# Patient Record
Sex: Female | Born: 1955 | Race: Black or African American | Hispanic: No | Marital: Married | Smoking: Never smoker
Health system: Southern US, Community
[De-identification: ages and names within clinical notes are randomized; demographics above are authoritative.]

## PROBLEM LIST (undated history)

## (undated) DIAGNOSIS — E039 Hypothyroidism, unspecified: Secondary | ICD-10-CM

## (undated) DIAGNOSIS — I639 Cerebral infarction, unspecified: Secondary | ICD-10-CM

## (undated) DIAGNOSIS — C801 Malignant (primary) neoplasm, unspecified: Secondary | ICD-10-CM

## (undated) DIAGNOSIS — E119 Type 2 diabetes mellitus without complications: Secondary | ICD-10-CM

## (undated) DIAGNOSIS — G43909 Migraine, unspecified, not intractable, without status migrainosus: Secondary | ICD-10-CM

## (undated) DIAGNOSIS — I10 Essential (primary) hypertension: Secondary | ICD-10-CM

## (undated) DIAGNOSIS — I472 Ventricular tachycardia, unspecified: Secondary | ICD-10-CM

## (undated) DIAGNOSIS — E079 Disorder of thyroid, unspecified: Secondary | ICD-10-CM

## (undated) DIAGNOSIS — E7800 Pure hypercholesterolemia, unspecified: Secondary | ICD-10-CM

## (undated) HISTORY — PX: BREAST SURGERY: SHX581

## (undated) HISTORY — PX: ABDOMINAL HYSTERECTOMY: SHX81

## (undated) HISTORY — PX: THYROIDECTOMY: SHX17

## (undated) HISTORY — DX: Pure hypercholesterolemia, unspecified: E78.00

## (undated) HISTORY — DX: Migraine, unspecified, not intractable, without status migrainosus: G43.909

## (undated) HISTORY — DX: Cerebral infarction, unspecified: I63.9

## (undated) HISTORY — DX: Malignant (primary) neoplasm, unspecified: C80.1

---

## 2009-01-31 ENCOUNTER — Emergency Department (HOSPITAL_BASED_OUTPATIENT_CLINIC_OR_DEPARTMENT_OTHER): Admission: EM | Admit: 2009-01-31 | Discharge: 2009-02-01 | Payer: Self-pay | Admitting: Psychiatry

## 2009-12-02 IMAGING — CR DG HIP COMPLETE 2+V*R*
3 series · 3 of 3 positions shown · non-contrast
Comparison: None.

CLINICAL DATA: Pt fell x 4 days ago. Pain Rt hip entirely radiates
to Rt buttocks. No old injury known.

RIGHT HIP - COMPLETE 2+ VIEW

[t pelvis a.p.]
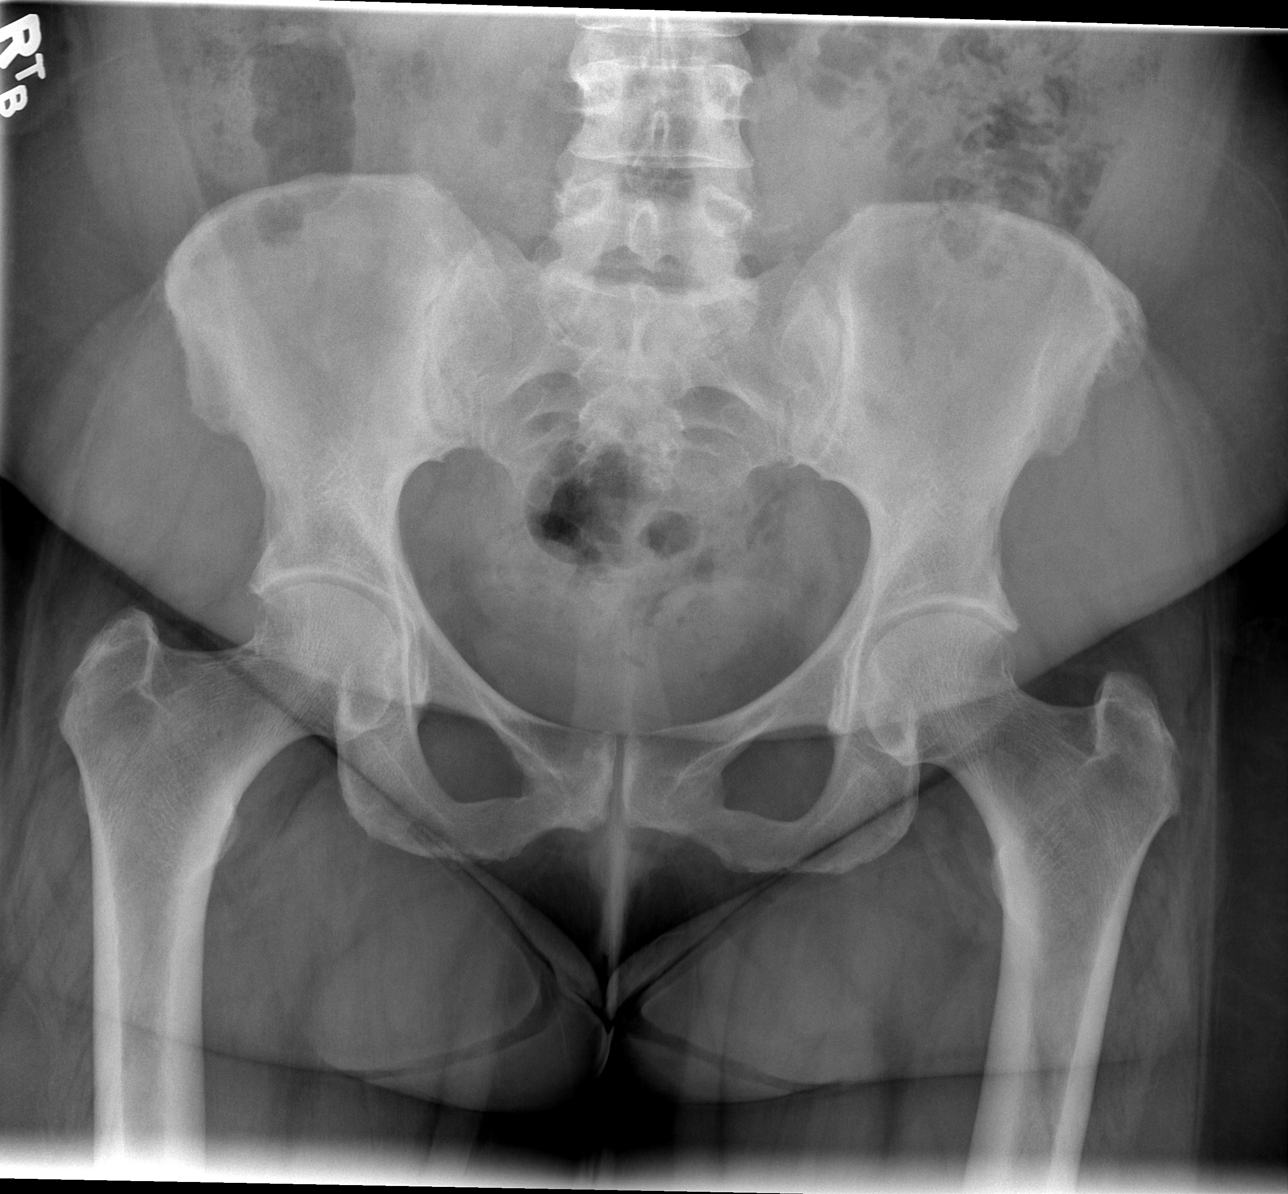

[t hip ap right]
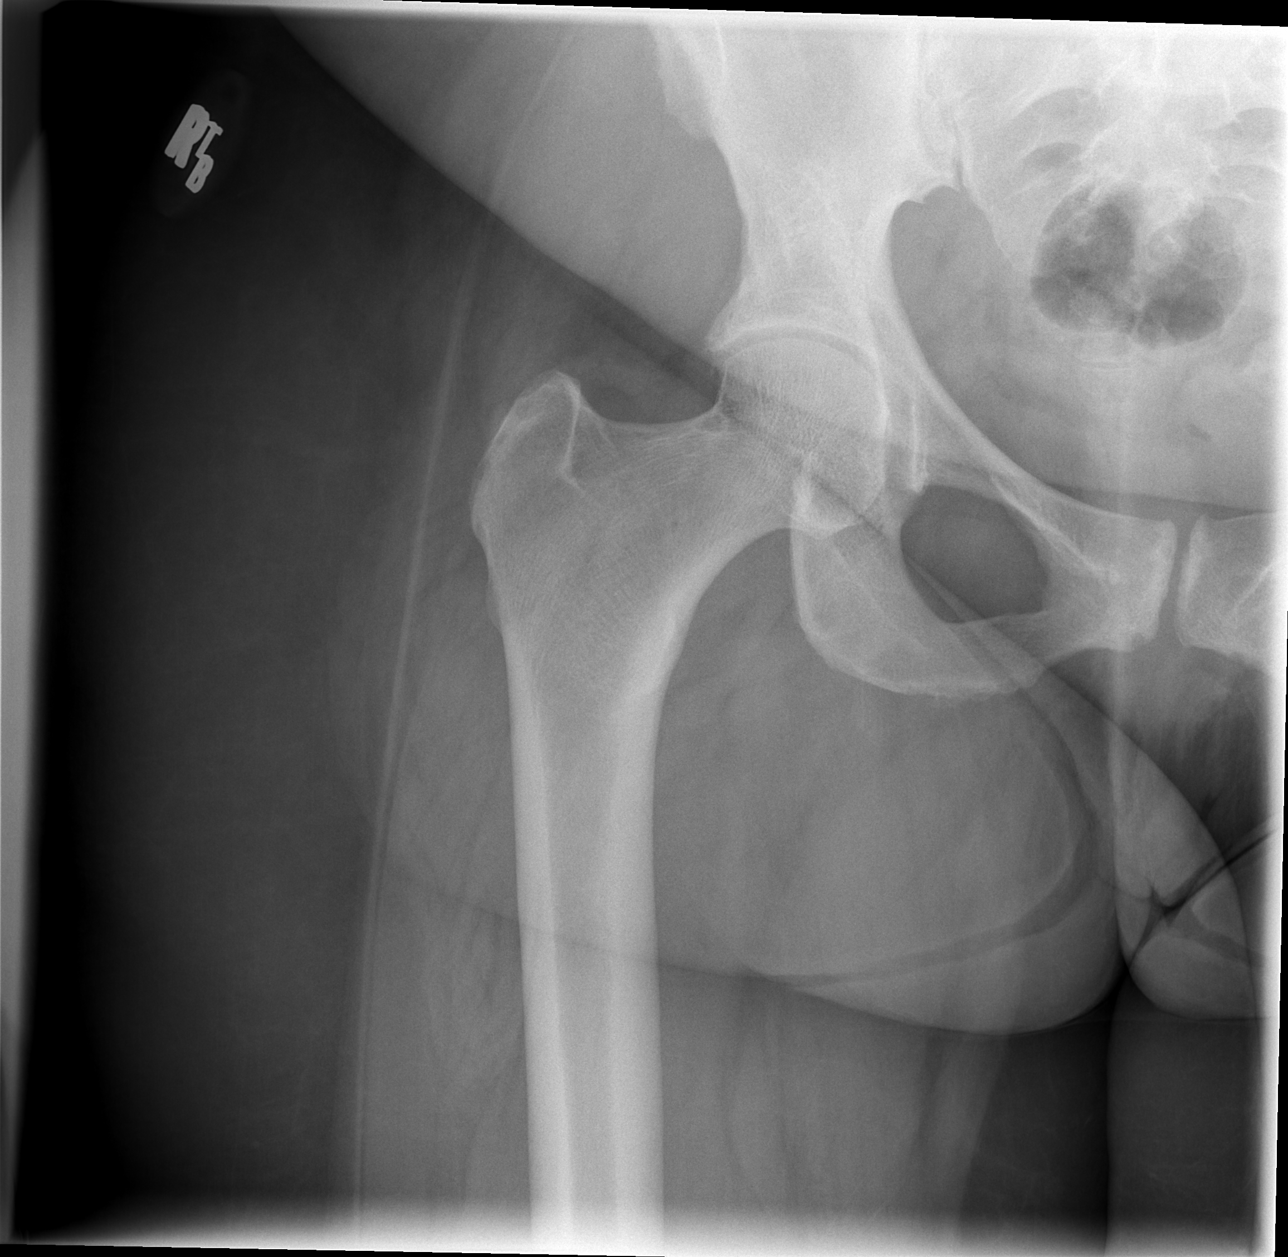

[t hip frog leg right]
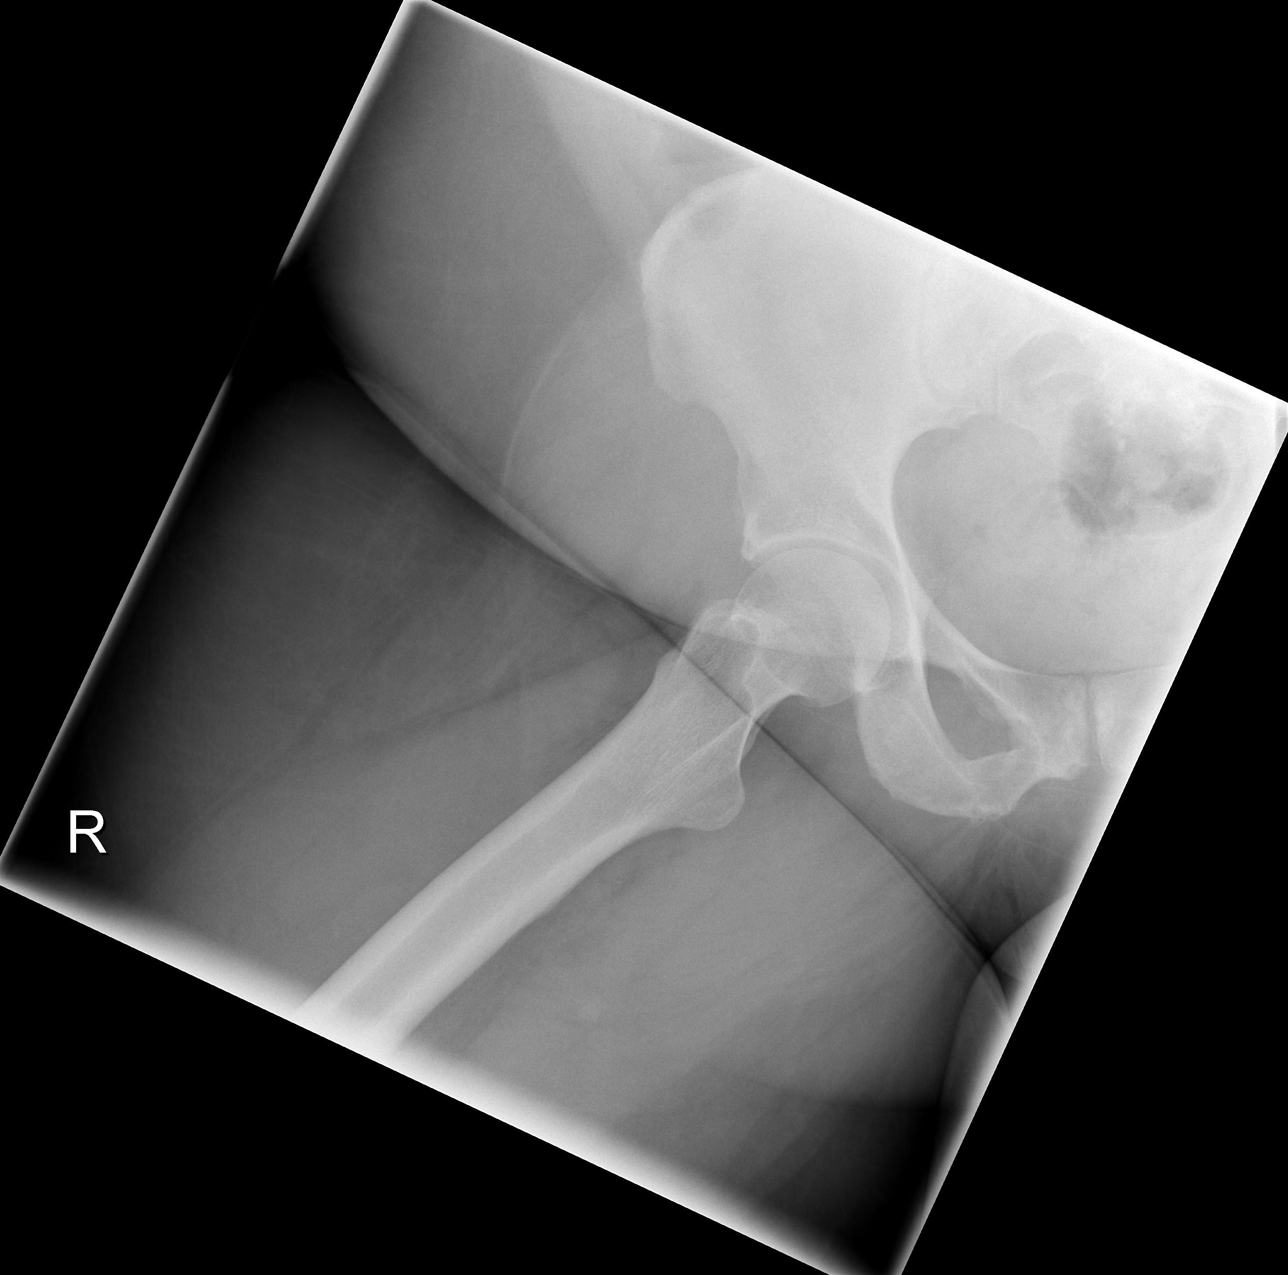

[3 of 3 positions shown; findings below may reference images not displayed]

FINDINGS: No fracture, dislocation, or acute bony findings
observed.
IMPRESSION: No significant abnormality identified.

## 2010-04-01 LAB — GLUCOSE, CAPILLARY: Glucose-Capillary: 131 mg/dL — ABNORMAL HIGH (ref 70–99)

## 2011-10-21 ENCOUNTER — Emergency Department (HOSPITAL_BASED_OUTPATIENT_CLINIC_OR_DEPARTMENT_OTHER): Payer: BC Managed Care – PPO

## 2011-10-21 ENCOUNTER — Emergency Department (HOSPITAL_BASED_OUTPATIENT_CLINIC_OR_DEPARTMENT_OTHER)
Admission: EM | Admit: 2011-10-21 | Discharge: 2011-10-21 | Disposition: A | Discharge status: Home / Self Care | Payer: BC Managed Care – PPO | Attending: Emergency Medicine | Admitting: Emergency Medicine

## 2011-10-21 ENCOUNTER — Encounter (HOSPITAL_BASED_OUTPATIENT_CLINIC_OR_DEPARTMENT_OTHER): Payer: Self-pay

## 2011-10-21 DIAGNOSIS — I10 Essential (primary) hypertension: Secondary | ICD-10-CM | POA: Insufficient documentation

## 2011-10-21 DIAGNOSIS — Y9210 Unspecified residential institution as the place of occurrence of the external cause: Secondary | ICD-10-CM | POA: Insufficient documentation

## 2011-10-21 DIAGNOSIS — W010XXA Fall on same level from slipping, tripping and stumbling without subsequent striking against object, initial encounter: Secondary | ICD-10-CM | POA: Insufficient documentation

## 2011-10-21 DIAGNOSIS — S79929A Unspecified injury of unspecified thigh, initial encounter: Secondary | ICD-10-CM | POA: Insufficient documentation

## 2011-10-21 DIAGNOSIS — E039 Hypothyroidism, unspecified: Secondary | ICD-10-CM | POA: Insufficient documentation

## 2011-10-21 DIAGNOSIS — S79919A Unspecified injury of unspecified hip, initial encounter: Secondary | ICD-10-CM | POA: Insufficient documentation

## 2011-10-21 DIAGNOSIS — M25559 Pain in unspecified hip: Secondary | ICD-10-CM

## 2011-10-21 DIAGNOSIS — E119 Type 2 diabetes mellitus without complications: Secondary | ICD-10-CM | POA: Insufficient documentation

## 2011-10-21 HISTORY — DX: Type 2 diabetes mellitus without complications: E11.9

## 2011-10-21 HISTORY — DX: Essential (primary) hypertension: I10

## 2011-10-21 HISTORY — DX: Disorder of thyroid, unspecified: E07.9

## 2011-10-21 MED ORDER — OXYCODONE-ACETAMINOPHEN 5-325 MG PO TABS: 1.0000 | ORAL_TABLET | Freq: Four times a day (QID) | ORAL | Status: DC | PRN

## 2011-10-21 MED ORDER — IBUPROFEN 600 MG PO TABS: 600.0000 mg | ORAL_TABLET | Freq: Four times a day (QID) | ORAL | Status: DC | PRN

## 2011-10-21 MED ORDER — HYDROCODONE-ACETAMINOPHEN 7.5-500 MG/15ML PO SOLN
10.0000 mL | Freq: Once | ORAL | Status: DC
  Filled 2011-10-21: qty 15

## 2011-10-21 NOTE — ED Provider Notes (Signed)
History     CSN: [id number]  Arrival date & time 10/21/11  1740   First MD Initiated Contact with Patient 10/21/11 1757      Chief Complaint  Patient presents with  . Fall  . Hip Pain    (Consider location/radiation/quality/duration/timing/severity/associated sxs/prior treatment) HPI Pt presents with pain in right hip.  She states she twisted her right hip and almost fell due to tripping over her dog on the stairwell 3 days ago.  States she did not fall down.  No pain at the time.  Pain has developed and has been worsening over the past several days.  Has been able to bear weight without difficulty.  No swelling.  Pain worse with certain positions.  Denies neck or back pain.  There are no other associated systemic symptoms, there are no other alleviating or modifying factors.   Past Medical History  Diagnosis Date  . Diabetes mellitus without complication   . Hypertension   . Thyroid disease     Past Surgical History  Procedure Date  . Thyroidectomy   . Abdominal hysterectomy     No family history on file.  History  Substance Use Topics  . Smoking status: Never Smoker   . Smokeless tobacco: Not on file  . Alcohol Use: No    OB History    Grav Para Term Preterm Abortions TAB SAB Ect Mult Living                  Review of Systems ROS reviewed and all otherwise negative except for mentioned in HPI  Allergies  Tape  Home Medications     BP 134/70  Pulse 76  Temp 98.4 F (36.9 C) (Oral)  Resp 20  Ht 5\' 2"  (1.575 m)  Wt 230 lb (104.327 kg)  BMI 42.07 kg/m2  SpO2 97% Vitals reviewed Physical Exam Physical Examination: General appearance - alert, well appearing, and in no distress Mental status - alert, oriented to person, place, and time Chest - clear to auscultation, no wheezes, rales or rhonchi, symmetric air entry Heart - normal rate, regular rhythm, normal S1, S2, no murmurs, rubs, clicks or gallops Musculoskeletal - ttp overlying right lateral  hip, some pain with internal/external rotation of right hip, no ttp over knee, no pain with ROM Extremities - peripheral pulses normal, no pedal edema, no clubbing or cyanosis Skin - normal coloration and turgor, no rashes  ED Course  Procedures (including critical care time)  Labs Reviewed - No data to display Dg Hip Complete Right  10/21/2011  *RADIOLOGY REPORT*  Clinical Data: Pt fell x 4 days ago. Pain Rt hip entirely radiates to Rt buttocks. No old injury known.  RIGHT HIP - COMPLETE 2+ VIEW  Comparison: None.  Findings: No fracture, dislocation, or acute bony findings observed.  IMPRESSION:  No significant abnormality identified.   Original Report Authenticated By: Dellia Cloud, M.D.      1. Hip pain       MDM  Pt presenting with c/o right hip pain after nearly falling several days ago.  Xray reassuring.  ttp over lateral right hip and pain with internal/external rotation.  Discharged with strict return precautions.  Pt agreeable with plan.        Ethelda Chick, MD 10/21/11 2322

## 2011-10-21 NOTE — ED Notes (Signed)
Pt reports "slipping" Friday and injuring right hip.  Pain is worse when ambulating and radiates down right leg.

## 2011-10-21 NOTE — ED Notes (Signed)
Patient declined the medication.

## 2013-01-23 ENCOUNTER — Other Ambulatory Visit: Payer: Self-pay

## 2013-01-23 ENCOUNTER — Emergency Department (HOSPITAL_BASED_OUTPATIENT_CLINIC_OR_DEPARTMENT_OTHER): Payer: No Typology Code available for payment source

## 2013-01-23 ENCOUNTER — Encounter (HOSPITAL_BASED_OUTPATIENT_CLINIC_OR_DEPARTMENT_OTHER): Payer: Self-pay | Admitting: Emergency Medicine

## 2013-01-23 ENCOUNTER — Emergency Department (HOSPITAL_BASED_OUTPATIENT_CLINIC_OR_DEPARTMENT_OTHER)
Admission: EM | Admit: 2013-01-23 | Discharge: 2013-01-23 | Disposition: A | Discharge status: Home / Self Care | Payer: No Typology Code available for payment source | Attending: Emergency Medicine | Admitting: Emergency Medicine

## 2013-01-23 DIAGNOSIS — E119 Type 2 diabetes mellitus without complications: Secondary | ICD-10-CM | POA: Insufficient documentation

## 2013-01-23 DIAGNOSIS — I10 Essential (primary) hypertension: Secondary | ICD-10-CM | POA: Insufficient documentation

## 2013-01-23 DIAGNOSIS — R0789 Other chest pain: Secondary | ICD-10-CM | POA: Insufficient documentation

## 2013-01-23 DIAGNOSIS — R110 Nausea: Secondary | ICD-10-CM | POA: Insufficient documentation

## 2013-01-23 DIAGNOSIS — R079 Chest pain, unspecified: Secondary | ICD-10-CM

## 2013-01-23 LAB — CBC WITH DIFFERENTIAL/PLATELET
BASOS ABS: 0 10*3/uL (ref 0.0–0.1)
Basophils Relative: 0 % (ref 0–1)
EOS PCT: 1 % (ref 0–5)
Eosinophils Absolute: 0.1 10*3/uL (ref 0.0–0.7)
HEMATOCRIT: 36.3 % (ref 36.0–46.0)
HEMOGLOBIN: 12 g/dL (ref 12.0–15.0)
LYMPHS ABS: 2.2 10*3/uL (ref 0.7–4.0)
LYMPHS PCT: 38 % (ref 12–46)
MCH: 28.2 pg (ref 26.0–34.0)
MCHC: 33.1 g/dL (ref 30.0–36.0)
MCV: 85.2 fL (ref 78.0–100.0)
MONO ABS: 0.4 10*3/uL (ref 0.1–1.0)
MONOS PCT: 8 % (ref 3–12)
Neutro Abs: 3.1 10*3/uL (ref 1.7–7.7)
Neutrophils Relative %: 53 % (ref 43–77)
Platelets: 299 10*3/uL (ref 150–400)
RBC: 4.26 MIL/uL (ref 3.87–5.11)
RDW: 13.7 % (ref 11.5–15.5)
WBC: 5.8 10*3/uL (ref 4.0–10.5)

## 2013-01-23 LAB — BASIC METABOLIC PANEL
BUN: 10 mg/dL (ref 6–23)
CHLORIDE: 100 meq/L (ref 96–112)
CO2: 29 meq/L (ref 19–32)
CREATININE: 0.9 mg/dL (ref 0.50–1.10)
Calcium: 9.2 mg/dL (ref 8.4–10.5)
GFR calc Af Amer: 81 mL/min — ABNORMAL LOW (ref >90)
GFR calc non Af Amer: 70 mL/min — ABNORMAL LOW (ref >90)
GLUCOSE: 108 mg/dL — AB (ref 70–99)
POTASSIUM: 3.7 meq/L (ref 3.7–5.3)
Sodium: 141 mEq/L (ref 137–147)

## 2013-01-23 LAB — TROPONIN I: Troponin I: <0.3 ng/mL (ref <0.30)

## 2013-01-23 LAB — PRO B NATRIURETIC PEPTIDE: Pro B Natriuretic peptide (BNP): 54 pg/mL (ref 0–125)

## 2013-01-23 IMAGING — CR DG CHEST 1V PORT
1 series · 1 of 1 positions shown · non-contrast
Comparison: None.

CLINICAL DATA: Upper chest pain

EXAM:
PORTABLE CHEST - 1 VIEW

[view not recorded]
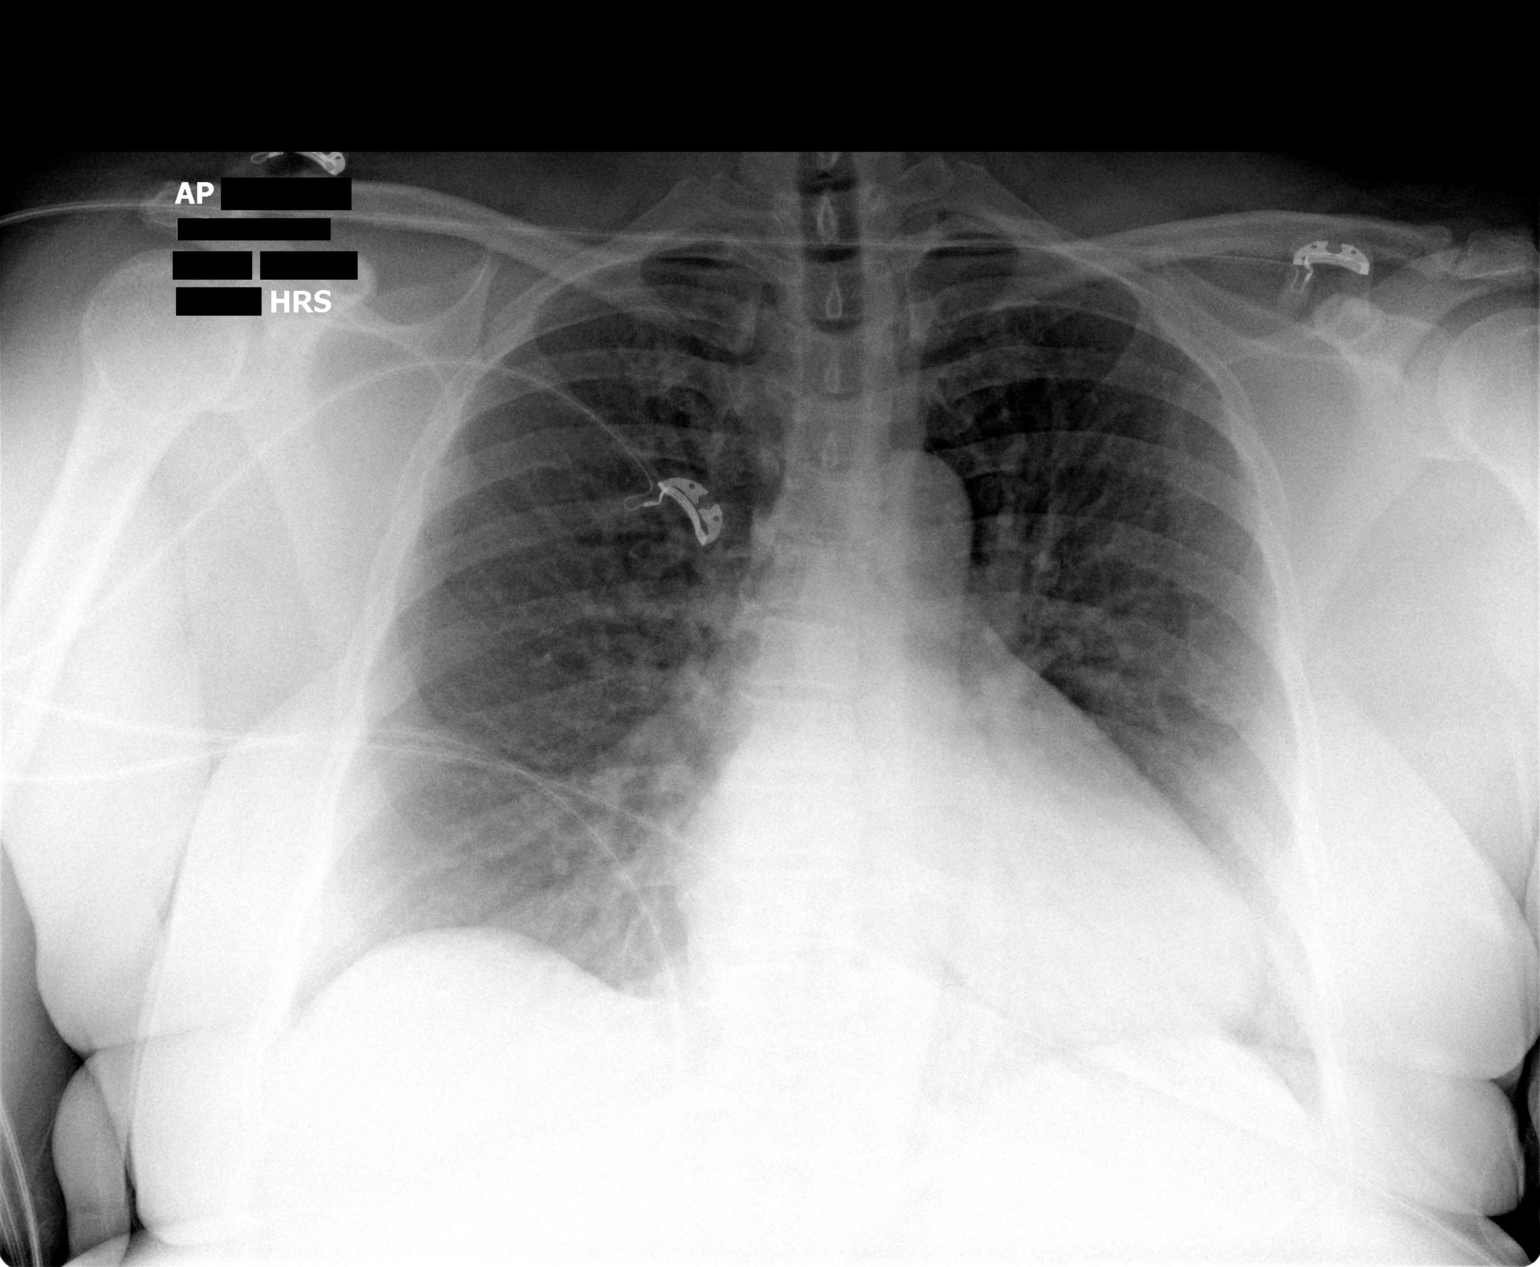

[1 of 1 positions shown; findings below may reference images not displayed]

FINDINGS: Mildly prominent interstitial markings. No frank interstitial edema.
No focal consolidation. No pleural effusion or pneumothorax.

The heart is normal in size.
IMPRESSION: No evidence of acute cardiopulmonary disease.

## 2013-01-23 MED ORDER — NITROGLYCERIN 0.4 MG SL SUBL
0.4000 mg | SUBLINGUAL_TABLET | SUBLINGUAL | Status: AC
  Administered 2013-01-23 (×3): 0.4 mg via SUBLINGUAL
  Filled 2013-01-23 (×2): qty 25

## 2013-01-23 MED ORDER — MORPHINE SULFATE 4 MG/ML IJ SOLN
4.0000 mg | Freq: Once | INTRAMUSCULAR | Status: AC
  Administered 2013-01-23: 4 mg via INTRAVENOUS
  Filled 2013-01-23: qty 1

## 2013-01-23 MED ORDER — ASPIRIN 325 MG PO TABS
325.0000 mg | ORAL_TABLET | Freq: Once | ORAL | Status: AC
  Administered 2013-01-23: 325 mg via ORAL
  Filled 2013-01-23: qty 1

## 2013-01-23 MED ORDER — NITROGLYCERIN 2 % TD OINT
1.0000 [in_us] | TOPICAL_OINTMENT | Freq: Once | TRANSDERMAL | Status: AC
  Administered 2013-01-23: 1 [in_us] via TOPICAL
  Filled 2013-01-23: qty 1

## 2013-01-23 NOTE — ED Notes (Signed)
Patient here with SSCP since yesterday, describes as intermittent with radiation to neck. Shortness of breath with exertion, no nausea, no diaphoresis. Describes the pain as uncomfortable pressure. Has been going to the gym and unsure if this is cardiac or muscular.

## 2013-01-23 NOTE — ED Provider Notes (Signed)
CSN: [id number]     Arrival date & time 01/23/13  4098 History   First MD Initiated Contact with Patient 01/23/13 1003     Chief Complaint  Patient presents with  . Chest Pain   (Consider location/radiation/quality/duration/timing/severity/associated sxs/prior Treatment) Patient is a 58 y.o. female presenting with chest pain. The history is provided by the patient. No language interpreter was used.  Chest Pain Pain location:  L chest Pain quality: tightness   Pain radiates to:  L jaw, R jaw, L shoulder and upper back Pain radiates to the back: yes   Pain severity:  Mild Onset quality:  Sudden Duration:  1 day Timing:  Intermittent Progression:  Waxing and waning Chronicity:  New Context: at rest   Relieved by:  Rest Worsened by:  Movement Ineffective treatments:  None tried Associated symptoms: nausea   Associated symptoms: no abdominal pain, no back pain, no cough, no diaphoresis, no dizziness, no fatigue, no fever, no headache, no numbness, no palpitations, no shortness of breath, not vomiting and no weakness   Nausea:    Severity:  Moderate   Onset quality:  Sudden   Timing:  Intermittent   Progression:  Waxing and waning Risk factors: diabetes mellitus and hypertension   Risk factors: no prior DVT/PE and no smoking   Risk factors comment:  FHx of premature CAD in mother   Past Medical History  Diagnosis Date  . Diabetes mellitus without complication   . Hypertension   . Thyroid disease    Past Surgical History  Procedure Laterality Date  . Thyroidectomy    . Abdominal hysterectomy     No family history on file. History  Substance Use Topics  . Smoking status: Never Smoker   . Smokeless tobacco: Not on file  . Alcohol Use: No   OB History   Grav Para Term Preterm Abortions TAB SAB Ect Mult Living                 Review of Systems  Constitutional: Negative for fever, chills, diaphoresis, activity change, appetite change and fatigue.  HENT: Negative for  congestion, facial swelling, rhinorrhea and sore throat.   Eyes: Negative for photophobia and discharge.  Respiratory: Negative for cough, chest tightness and shortness of breath.   Cardiovascular: Positive for chest pain. Negative for palpitations and leg swelling.  Gastrointestinal: Positive for nausea. Negative for vomiting, abdominal pain and diarrhea.  Endocrine: Negative for polydipsia and polyuria.  Genitourinary: Negative for dysuria, frequency, difficulty urinating and pelvic pain.  Musculoskeletal: Negative for arthralgias, back pain, neck pain and neck stiffness.  Skin: Negative for color change and wound.  Allergic/Immunologic: Negative for immunocompromised state.  Neurological: Negative for dizziness, facial asymmetry, weakness, numbness and headaches.  Hematological: Does not bruise/bleed easily.  Psychiatric/Behavioral: Negative for confusion and agitation.    Allergies  Tape  Home Medications    BP 108/59  Pulse 66  Temp(Src) 98.1 F (36.7 C) (Oral)  Resp 17  SpO2 99% Physical Exam  Constitutional: She is oriented to person, place, and time. She appears well-developed and well-nourished. No distress.  HENT:  Head: Normocephalic and atraumatic.  Mouth/Throat: No oropharyngeal exudate.  Eyes: Pupils are equal, round, and reactive to light.  Neck: Normal range of motion. Neck supple.  Cardiovascular: Normal rate, regular rhythm and normal heart sounds.  Exam reveals no gallop and no friction rub.   No murmur heard. Pulmonary/Chest: Effort normal and breath sounds normal. No respiratory distress. She has no wheezes. She has  no rales.  Abdominal: Soft. Bowel sounds are normal. She exhibits no distension and no mass. There is no tenderness. There is no rebound and no guarding.  Musculoskeletal: Normal range of motion. She exhibits no edema and no tenderness.  Neurological: She is alert and oriented to person, place, and time.  Skin: Skin is warm and dry.   Psychiatric: She has a normal mood and affect.    ED Course  Procedures (including critical care time) Labs Review Labs Reviewed  BASIC METABOLIC PANEL - Abnormal; Notable for the following:    Glucose, Bld 108 (*)    GFR calc non Af Amer 70 (*)    GFR calc Af Amer 81 (*)    All other components within normal limits  CBC WITH DIFFERENTIAL  TROPONIN I  PRO B NATRIURETIC PEPTIDE   Imaging Review Dg Chest Portable 1 View  01/23/2013   CLINICAL DATA:  Upper chest pain  EXAM: PORTABLE CHEST - 1 VIEW  COMPARISON:  None.  FINDINGS: Mildly prominent interstitial markings. No frank interstitial edema. No focal consolidation. No pleural effusion or pneumothorax.  The heart is normal in size.  IMPRESSION: No evidence of acute cardiopulmonary disease.   Electronically Signed   By: Julian Hy M.D.   On: 01/23/2013 11:28    EKG Interpretation    Date/Time:  Sunday January 23 2013 09:57:19 EST Ventricular Rate:  79 PR Interval:  148 QRS Duration: 74 QT Interval:  374 QTC Calculation: 428 R Axis:   52 Text Interpretation:  Normal sinus rhythm Nonspecific ST and T wave abnormality Abnormal ECG no prior available Confirmed by Jatziry Wechter  MD, Verbie Babic ([id number]) on 01/23/2013 10:56:36 AM            MDM   1. Chest pain    Pt is a 58 y.o. female with Pmhx as above who presents with about 24 hours of intermittent central CP w/ radition to L shoulder, back, jaw pain, episode of nausea.  Worse w/ exertion, better w/ rest.  +FH of premature CAD.  EKG w/ SPSTT changes. CXR, first trop nml.  CP resolved after NTG x3, morphine x1.  Given concerning elements of hx and PMHx, I feel she should be admitted to [facility] r/o.  Pt will be transferred to [facility], Dr. William Hamburger.          Neta Ehlers, MD 01/23/13 [phone]

## 2013-01-23 NOTE — ED Notes (Signed)
Patient stated that chest pressure had eased off, but had resumed a little, patient given additional nitro tab

## 2013-10-04 ENCOUNTER — Ambulatory Visit (INDEPENDENT_AMBULATORY_CARE_PROVIDER_SITE_OTHER): Payer: BC Managed Care – PPO

## 2013-10-04 VITALS — BP 126/60 | HR 77 | Resp 12

## 2013-10-04 DIAGNOSIS — S90122S Contusion of left lesser toe(s) without damage to nail, sequela: Secondary | ICD-10-CM

## 2013-10-04 DIAGNOSIS — B351 Tinea unguium: Secondary | ICD-10-CM

## 2013-10-04 DIAGNOSIS — E1149 Type 2 diabetes mellitus with other diabetic neurological complication: Secondary | ICD-10-CM

## 2013-10-04 DIAGNOSIS — R52 Pain, unspecified: Secondary | ICD-10-CM

## 2013-10-04 DIAGNOSIS — E1140 Type 2 diabetes mellitus with diabetic neuropathy, unspecified: Secondary | ICD-10-CM

## 2013-10-04 DIAGNOSIS — E1142 Type 2 diabetes mellitus with diabetic polyneuropathy: Secondary | ICD-10-CM

## 2013-10-04 DIAGNOSIS — IMO0002 Reserved for concepts with insufficient information to code with codable children: Secondary | ICD-10-CM

## 2013-10-04 NOTE — Patient Instructions (Signed)
Ringworm, Nail A fungal infection of the nail (tinea unguium/onychomycosis) is common. It is common as the visible part of the nail is composed of dead cells which have no blood supply to help prevent infection. It occurs because fungi are everywhere and will pick any opportunity to grow on any dead material. Because nails are very slow growing they require up to 2 years of treatment with anti-fungal medications. The entire nail back to the base is infected. This includes approximately  of the nail which you cannot see. If your caregiver has prescribed a medication by mouth, take it every day and as directed. No progress will be seen for at least 6 to 9 months. Do not be disappointed! Because fungi live on dead cells with little or no exposure to blood supply, medication delivery to the infection is slow; thus the cure is slow. It is also why you can observe no progress in the first 6 months. The nail becoming cured is the base of the nail, as it has the blood supply. Topical medication such as creams and ointments are usually not effective. Important in successful treatment of nail fungus is closely following the medication regimen that your doctor prescribes. Sometimes you and your caregiver may elect to speed up this process by surgical removal of all the nails. Even this may still require 6 to 9 months of additional oral medications. See your caregiver as directed. Remember there will be no visible improvement for at least 6 months. See your caregiver sooner if other signs of infection (redness and swelling) develop. Document Released: 12/28/1999 Document Revised: 03/24/2011 Document Reviewed: 03/07/2008 [facility] Patient Information 2015 [place], [place]. This information is not intended to replace advice given to you by your health care provider. Make sure you discuss any questions you have with your health care provider.   Recommend treatment for the fungus nail infection. Can't obtain Fungi-Nail at  any pharmacy without prescription. Applied to each affected toenail once daily for 6-12 month      Diabetic Neuropathy Diabetic neuropathy is a nerve disease or nerve damage that is caused by diabetes mellitus. About half of all people with diabetes mellitus have some form of nerve damage. Nerve damage is more common in those who have had diabetes mellitus for many years and who generally have not had good control of their blood sugar (glucose) level. Diabetic neuropathy is a common complication of diabetes mellitus. There are three more common types of diabetic neuropathy and a fourth type that is less common and less understood:   Peripheral neuropathy--This is the most common type of diabetic neuropathy. It causes damage to the nerves of the feet and legs first and then eventually the hands and arms.The damage affects the ability to sense touch.  Autonomic neuropathy--This type causes damage to the autonomic nervous system, which controls the following functions:  Heartbeat.  Body temperature.  Blood pressure.  Urination.  Digestion.  Sweating.  Sexual function.  Focal neuropathy--Focal neuropathy can be painful and unpredictable and occurs most often in older adults with diabetes mellitus. It involves a specific nerve or one area and often comes on suddenly. It usually does not cause long-term problems.  Radiculoplexus neuropathy-- Sometimes called lumbosacral radiculoplexus neuropathy, radiculoplexus neuropathy affects the nerves of the thighs, hips, buttocks, or legs. It is more common in people with type 2 diabetes mellitus and in older men. It is characterized by debilitating pain, weakness, and atrophy, usually in the thigh muscles. CAUSES  The cause of peripheral, autonomic,  and focal neuropathies is diabetes mellitus that is uncontrolled and high glucose levels. The cause of radiculoplexus neuropathy is unknown. However, it is thought to be caused by inflammation related to  uncontrolled glucose levels. SIGNS AND SYMPTOMS  Peripheral Neuropathy Peripheral neuropathy develops slowly over time. When the nerves of the feet and legs no longer work there may be:   Burning, stabbing, or aching pain in the legs or feet.  Inability to feel pressure or pain in your feet. This can lead to:  Thick calluses over pressure areas.  Pressure sores.  Ulcers.  Foot deformities.  Reduced ability to feel temperature changes.  Muscle weakness. Autonomic Neuropathy The symptoms of autonomic neuropathy vary depending on which nerves are affected. Symptoms may include:  Problems with digestion, such as:  Feeling sick to your stomach (nausea).  Vomiting.  Bloating.  Constipation.  Diarrhea.  Abdominal pain.  Difficulty with urination. This occurs if you lose your ability to sense when your bladder is full. Problems include:  Urine leakage (incontinence).  Inability to empty your bladder completely (retention).  Rapid or irregular heartbeat (palpitations).  Blood pressure drops when you stand up (orthostatic hypotension). When you stand up you may feel:  Dizzy.  Weak.  Faint.  In men, inability to attain and maintain an erection.  In women, vaginal dryness and problems with decreased sexual desire and arousal.  Problems with body temperature regulation.  Increased or decreased sweating. Focal Neuropathy  Abnormal eye movements or abnormal alignment of both eyes.  Weakness in the wrist.  Foot drop. This results in an inability to lift the foot properly and abnormal walking or foot movement.  Paralysis on one side of your face (Bell palsy).  Chest or abdominal pain. Radiculoplexus Neuropathy  Sudden, severe pain in your hip, thigh, or buttocks.  Weakness and wasting of thigh muscles.  Difficulty rising from a seated position.  Abdominal swelling.  Unexplained weight loss (usually more than 10 lb [4.5 kg]). DIAGNOSIS  Peripheral  Neuropathy Your senses may be tested. Sensory function testing can be done with:  A light touch using a monofilament.  A vibration with tuning fork.  A sharp sensation with a pin prick. Other tests that can help diagnose neuropathy are:  Nerve conduction velocity. This test checks the transmission of an electrical current through a nerve.  Electromyography. This shows how muscles respond to electrical signals transmitted by nearby nerves.  Quantitative sensory testing. This is used to assess how your nerves respond to vibrations and changes in temperature. Autonomic Neuropathy Diagnosis is often based on reported symptoms. Tell your health care provider if you experience:   Dizziness.   Constipation.   Diarrhea.   Inappropriate urination or inability to urinate.   Inability to get or maintain an erection.  Tests that may be done include:   Electrocardiography or Holter monitor. These are tests that can help show problems with the heart rate or heart rhythm.   An X-ray exam may be done. Focal Neuropathy Diagnosis is made based on your symptoms and what your health care provider finds during your exam. Other tests may be done. They may include:  Nerve conduction velocities. This checks the transmission of electrical current through a nerve.  Electromyography. This shows how muscles respond to electrical signals transmitted by nearby nerves.  Quantitative sensory testing. This test is used to assess how your nerves respond to vibration and changes in temperature. Radiculoplexus Neuropathy  Often the first thing is to eliminate any other issue  or problems that might be the cause, as there is no stick test for diagnosis.  X-ray exam of your spine and lumbar region.  Spinal tap to rule out cancer.  MRI to rule out other lesions. TREATMENT  Once nerve damage occurs, it cannot be reversed. The goal of treatment is to keep the disease or nerve damage from getting worse  and affecting more nerve fibers. Controlling your blood glucose level is the key. Most people with radiculoplexus neuropathy see at least a partial improvement over time. You will need to keep your blood glucose and HbA1c levels in the target range determined by your health care provider. Things that help control blood glucose levels include:   Blood glucose monitoring.   Meal planning.   Physical activity.   Diabetes medicine.  Over time, maintaining lower blood glucose levels helps lessen symptoms. Sometimes, prescription pain medicine is needed. HOME CARE INSTRUCTIONS:  Do not smoke.  Keep your blood glucose level in the range that you and your health care provider have determined acceptable for you.  Keep your blood pressure level in the range that you and your health care provider have determined acceptable for you.  Eat a well-balanced diet.  Be active every day.  Check your feet every day. SEEK MEDICAL CARE IF:   You have burning, stabbing, or aching pain in the legs or feet.  You are unable to feel pressure or pain in your feet.  You develop problems with digestion such as:  Nausea.  Vomiting.  Bloating.  Constipation.  Diarrhea.  Abdominal pain.  You have difficulty with urination, such as:  Incontinence.  Retention.  You have palpitations.  You develop orthostatic hypotension. When you stand up you may feel:  Dizzy.  Weak.  Faint.  You cannot attain and maintain an erection (in men).  You have vaginal dryness and problems with decreased sexual desire and arousal (in women).  You have severe pain in your thighs, legs, or buttocks.  You have unexplained weight loss. Document Released: 03/10/2001 Document Revised: 10/20/2012 Document Reviewed: 06/10/2012 [facility] Patient Information 2015 [place], [place]. This information is not intended to replace advice given to you by your health care provider. Make sure you discuss any questions you  have with your health care provider.

## 2013-10-04 NOTE — Progress Notes (Signed)
   Subjective:    Patient ID: Terri Waters, female    DOB: 09-29-1955, 58 y.o.   MRN: [id number]  HPI PT STATED INJURED LT FOOT GREAT TOENAIL SPLIT ON THE SIDE AND IS BEEN HURTING 1 WEEK. THE TOENAIL IS GETTING WORSE AND AT NIGHT IS THROBBING. THE TOENAIL GET AGGRAVATED BY PRESSURE. TRIED NO TREATMENT.  ALSO RT FOOT ARCH HAVE COLD SPOT BUT DOES NOT HURTING. THE FOOT IS GETTING WORSE. TRIED NO TREATMENT.   Review of Systems  Endocrine: Positive for cold intolerance.  Skin: Positive for color change.  All other systems reviewed and are negative.      Objective:   Physical Exam 58 year old [place] American female presents this time with history of contusion to her left great toe which of fracture or fissure mail. Patient also some Spot the medial arch of her right foot she feels cold Spot neurovascular status is intact pedal pulses are palpable epicritic and proprioceptive sensations intact and symmetric bilateral. There is normal plantar response DTRs not elicited dermatologically skin color pigment normal hair growth absent nails criptotic hallux bilateral there is cracking and fissuring of left hallux nail plate medial border with some subungual debris and discoloration and friability noted consistent with early onychomycosis. If fails also bilateral shoulder thickening and dystrophy and discoloration onychomycosis. Orthopedic exam otherwise unremarkable rectus foot type mild semirigid digital contractures are noted 2 through 5 bilateral clinically and radiographically on the right. Right foot x-rays reveal no other fractures or other osseous malleus mild inferior calcaneal spur and hammertoe deformities noted       Assessment & Plan:  Assessment this time diabetes with history peripheral neuropathy possibly neuropathy and neuritis affecting the medial arch recommended vitamin supplement B6 Q94 and folic acid her good multivitamins maintaining good control of her diabetes as  well.  Assessment #2 is contusion of toe with onychomycosis and fissured nail plate left hallux the nails debrided as well as the fifth digits bilateral with thickening hypertrophy and onychomycosis recommend topical Fungi-Nail apply daily to be obtained OTC. Return at future followup palliative nail care and as-needed basis contact us if the neuropathy becomes more pain for symptomatic possibly a medication such as Lyrica or Neurontin may be beneficial in the future.  Harriet Masson DPM

## 2014-01-07 ENCOUNTER — Emergency Department (HOSPITAL_BASED_OUTPATIENT_CLINIC_OR_DEPARTMENT_OTHER): Payer: BC Managed Care – PPO

## 2014-01-07 ENCOUNTER — Emergency Department (HOSPITAL_BASED_OUTPATIENT_CLINIC_OR_DEPARTMENT_OTHER)
Admission: EM | Admit: 2014-01-07 | Discharge: 2014-01-07 | Disposition: A | Discharge status: Home / Self Care | Payer: BC Managed Care – PPO | Attending: Emergency Medicine | Admitting: Emergency Medicine

## 2014-01-07 ENCOUNTER — Encounter (HOSPITAL_BASED_OUTPATIENT_CLINIC_OR_DEPARTMENT_OTHER): Payer: Self-pay | Admitting: Emergency Medicine

## 2014-01-07 DIAGNOSIS — M1990 Unspecified osteoarthritis, unspecified site: Secondary | ICD-10-CM | POA: Diagnosis not present

## 2014-01-07 DIAGNOSIS — Z7952 Long term (current) use of systemic steroids: Secondary | ICD-10-CM | POA: Diagnosis not present

## 2014-01-07 DIAGNOSIS — Z79899 Other long term (current) drug therapy: Secondary | ICD-10-CM | POA: Diagnosis not present

## 2014-01-07 DIAGNOSIS — M25572 Pain in left ankle and joints of left foot: Secondary | ICD-10-CM | POA: Insufficient documentation

## 2014-01-07 DIAGNOSIS — Z791 Long term (current) use of non-steroidal anti-inflammatories (NSAID): Secondary | ICD-10-CM | POA: Insufficient documentation

## 2014-01-07 DIAGNOSIS — E119 Type 2 diabetes mellitus without complications: Secondary | ICD-10-CM | POA: Diagnosis not present

## 2014-01-07 DIAGNOSIS — R52 Pain, unspecified: Secondary | ICD-10-CM

## 2014-01-07 DIAGNOSIS — I10 Essential (primary) hypertension: Secondary | ICD-10-CM | POA: Diagnosis not present

## 2014-01-07 DIAGNOSIS — M79672 Pain in left foot: Secondary | ICD-10-CM

## 2014-01-07 DIAGNOSIS — E079 Disorder of thyroid, unspecified: Secondary | ICD-10-CM | POA: Insufficient documentation

## 2014-01-07 IMAGING — CR DG TIBIA/FIBULA 2V*L*
4 series · 4 of 4 positions shown · non-contrast
Comparison: None

CLINICAL DATA: Pain in LEFT leg that started today, tingling in
foot

EXAM:
LEFT TIBIA AND FIBULA - 2 VIEW

[t tib/fib ap left (1 of 2)]
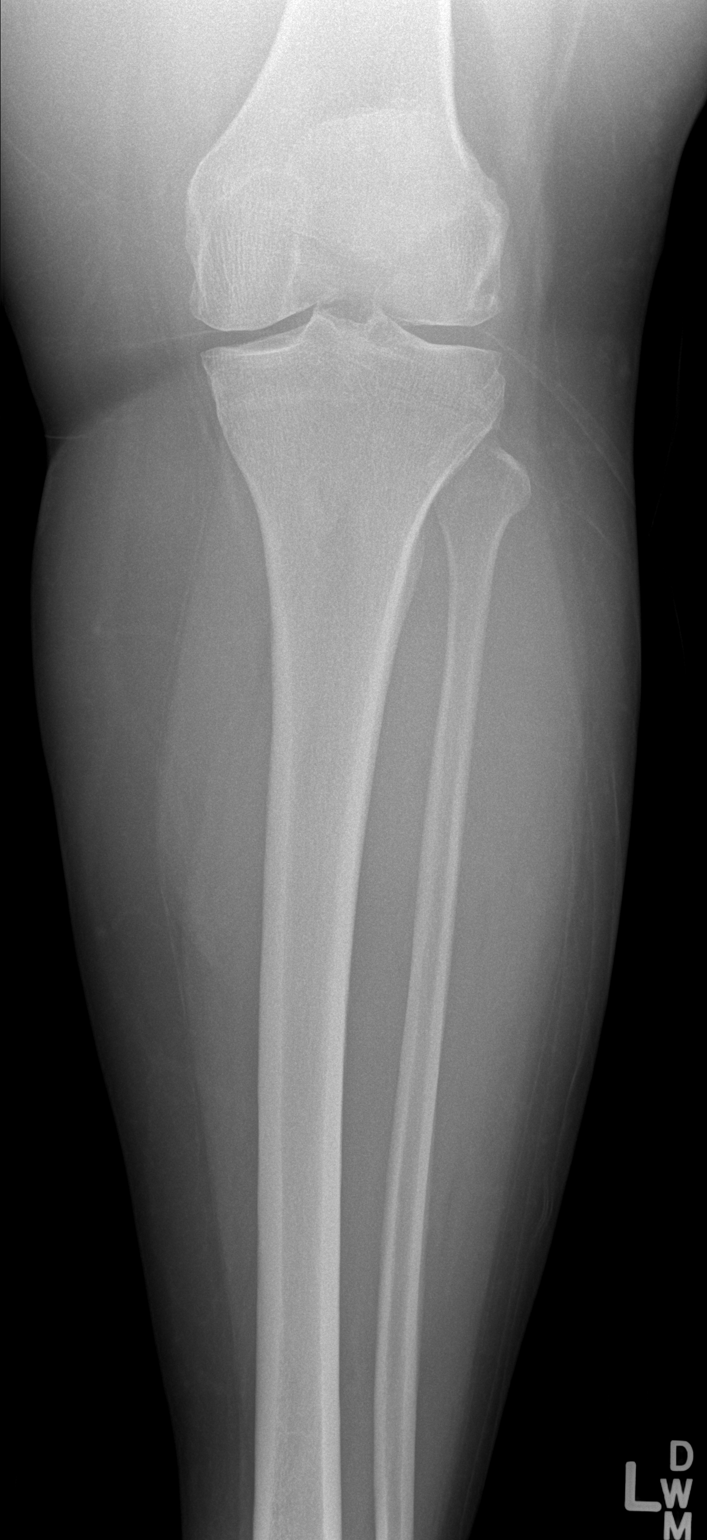

[t tib/fib ap left (2 of 2)]
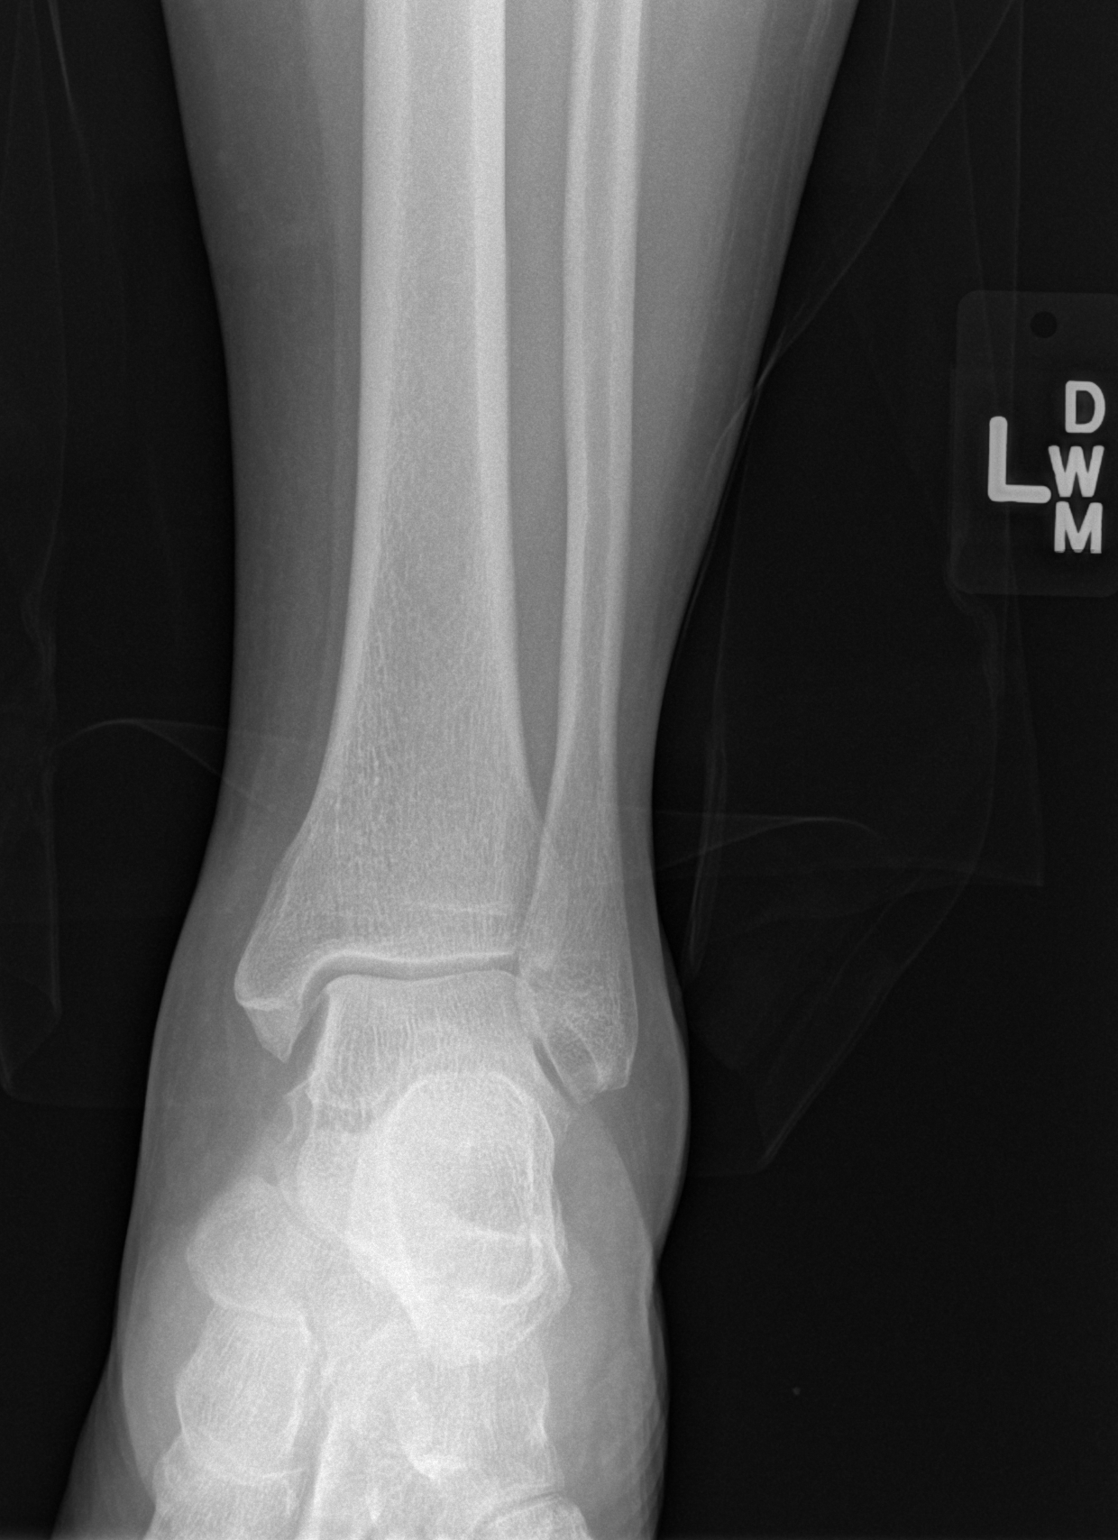

[t tib/fib lat left (1 of 2)]
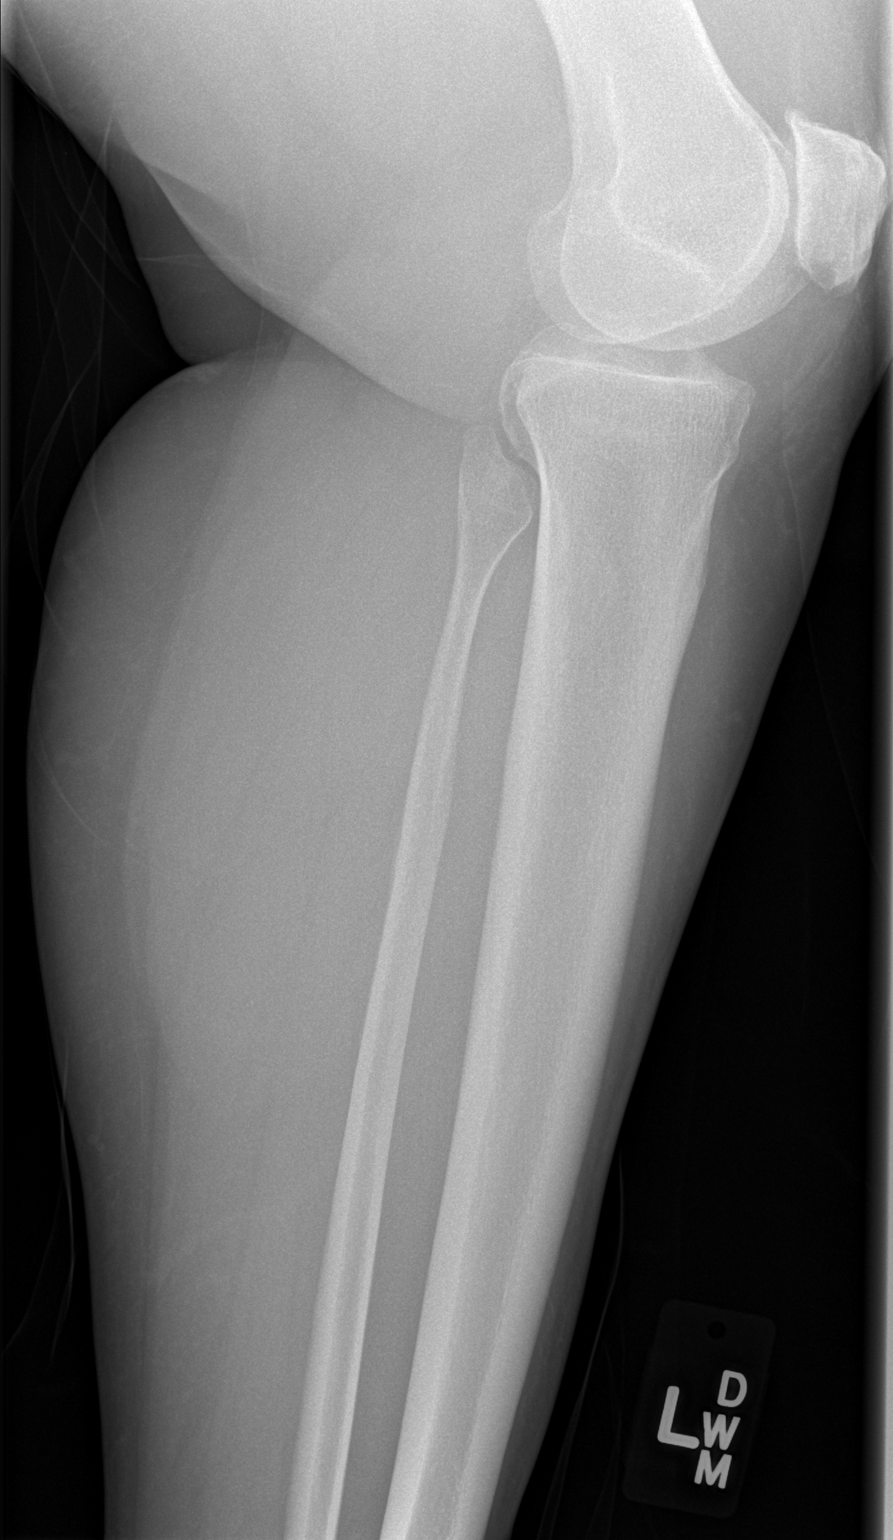

[t tib/fib lat left (2 of 2)]
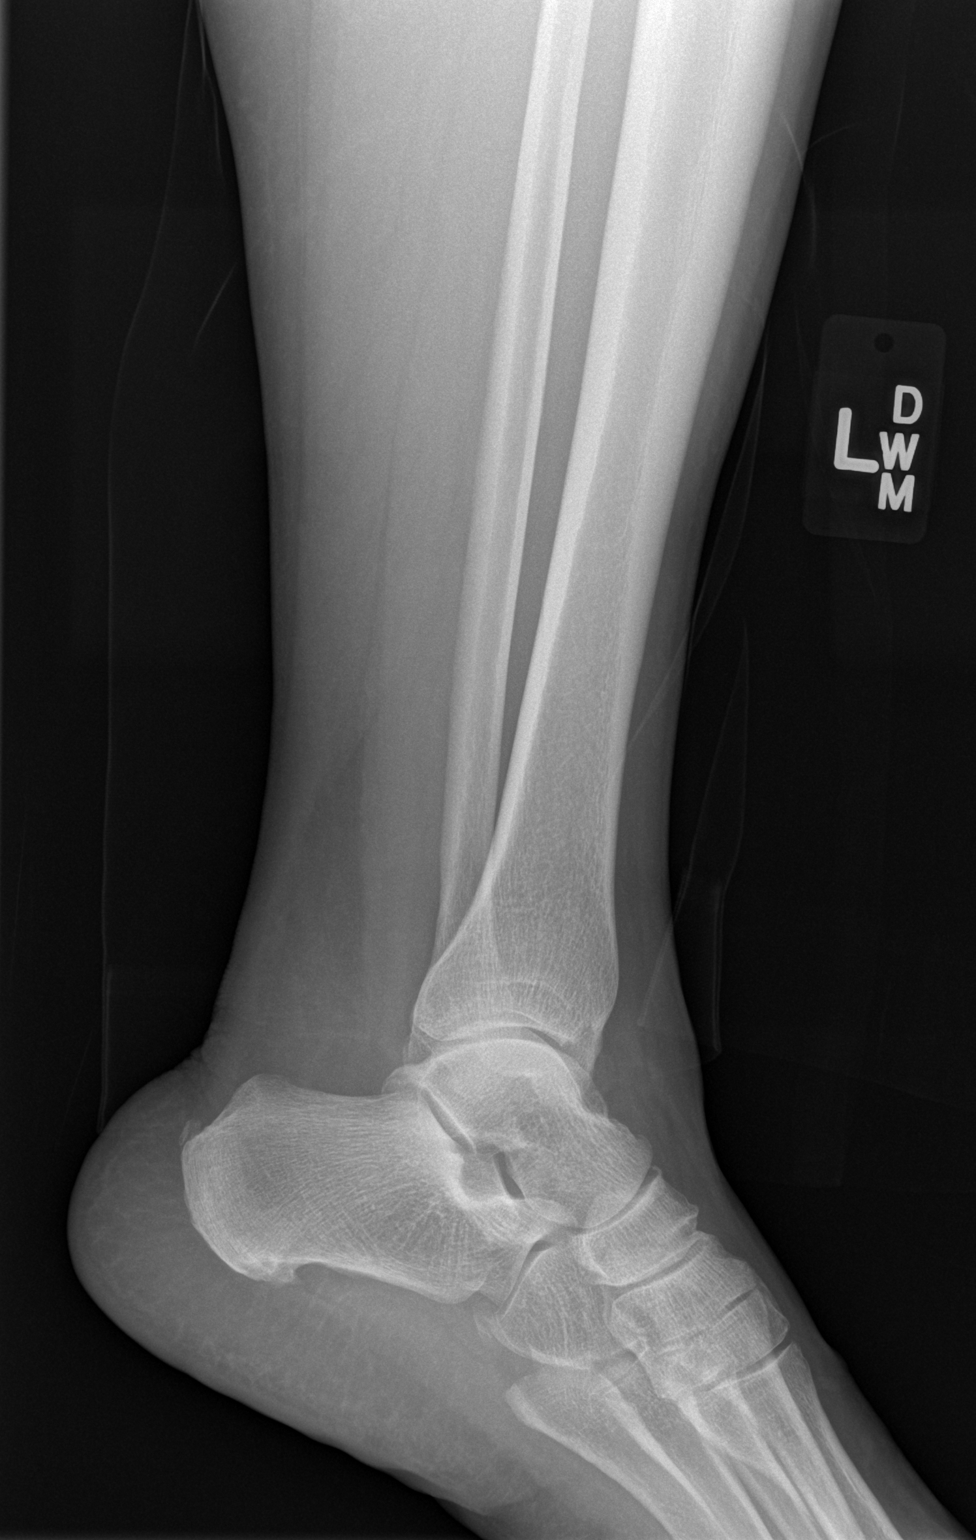

[4 of 4 positions shown; findings below may reference images not displayed]

FINDINGS: Osseous demineralization.

Mild patellofemoral joint space narrowing and minimal spur
formation.

No acute fracture, dislocation or bone destruction.

Small plantar and Achilles insertion calcaneal spurs.
IMPRESSION: Osseous demineralization with minimal degenerative changes LEFT
knee.

No acute abnormalities.

## 2014-01-07 IMAGING — CR DG KNEE 1-2V*L*
2 series · 2 of 2 positions shown · non-contrast
Comparison: None.

CLINICAL DATA: Pain, tingling in left lower extremity from knee to
toes. No known injury. Cannot bear weight. Initial encounter.

EXAM:
LEFT KNEE - 1-2 VIEW

[t knee ap left]
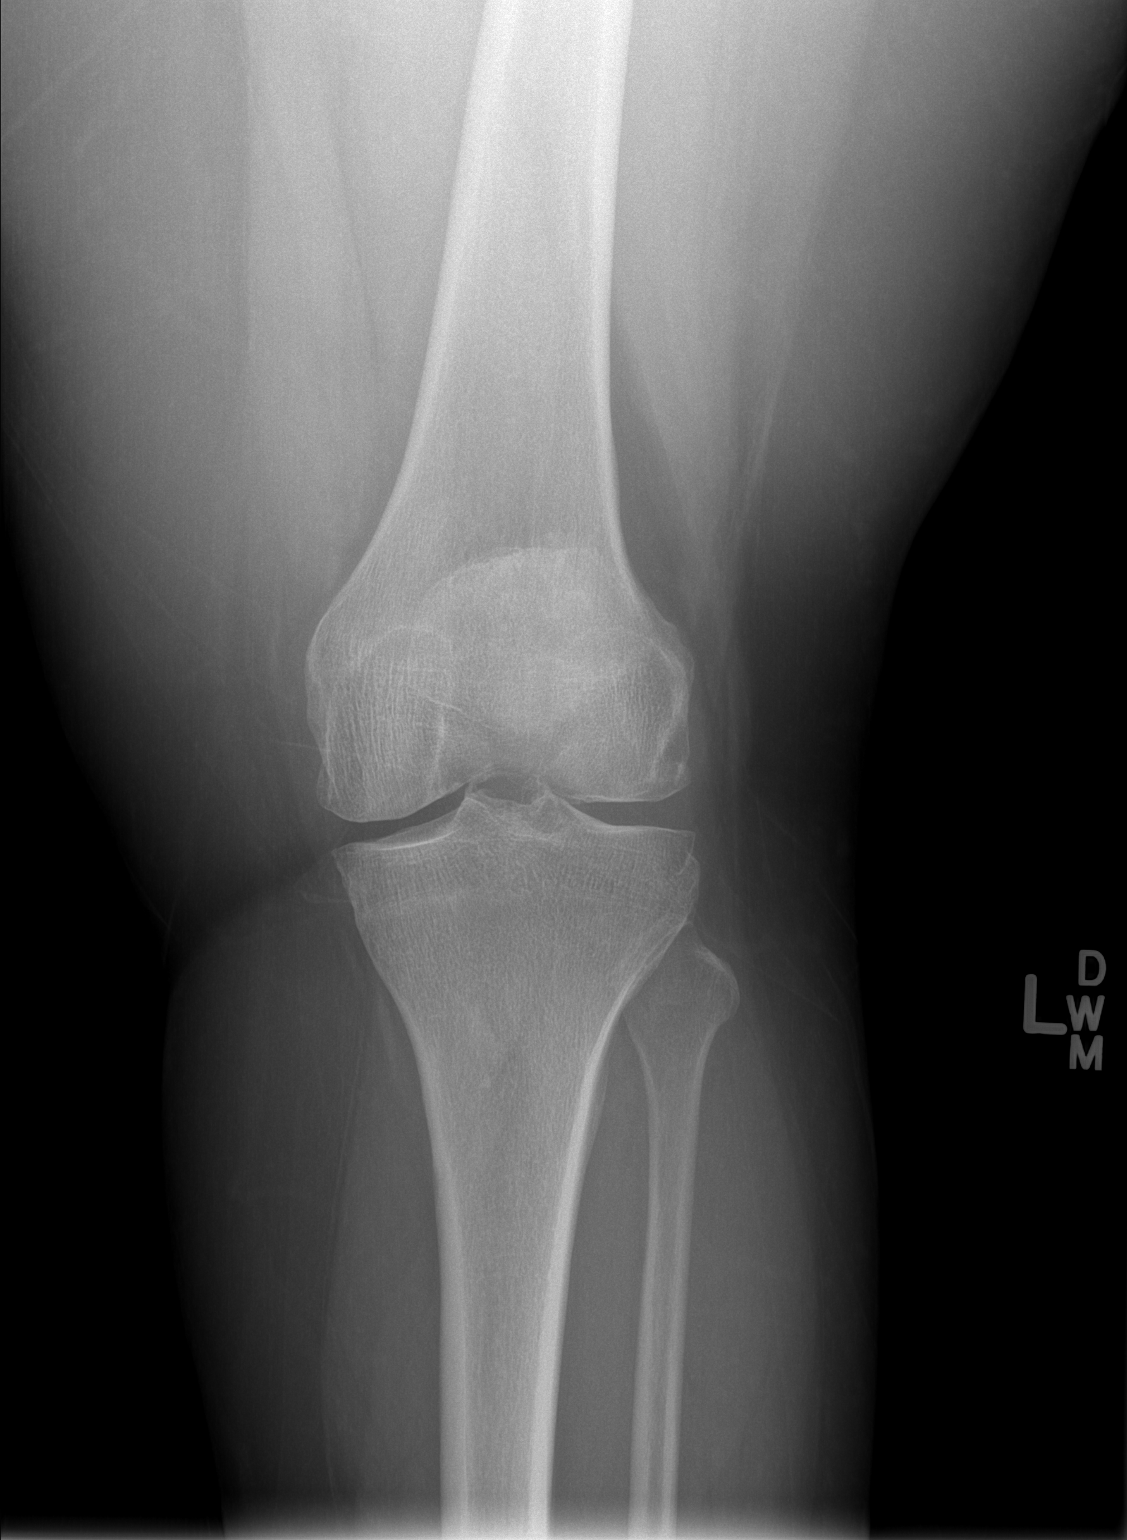

[t knee lat left]
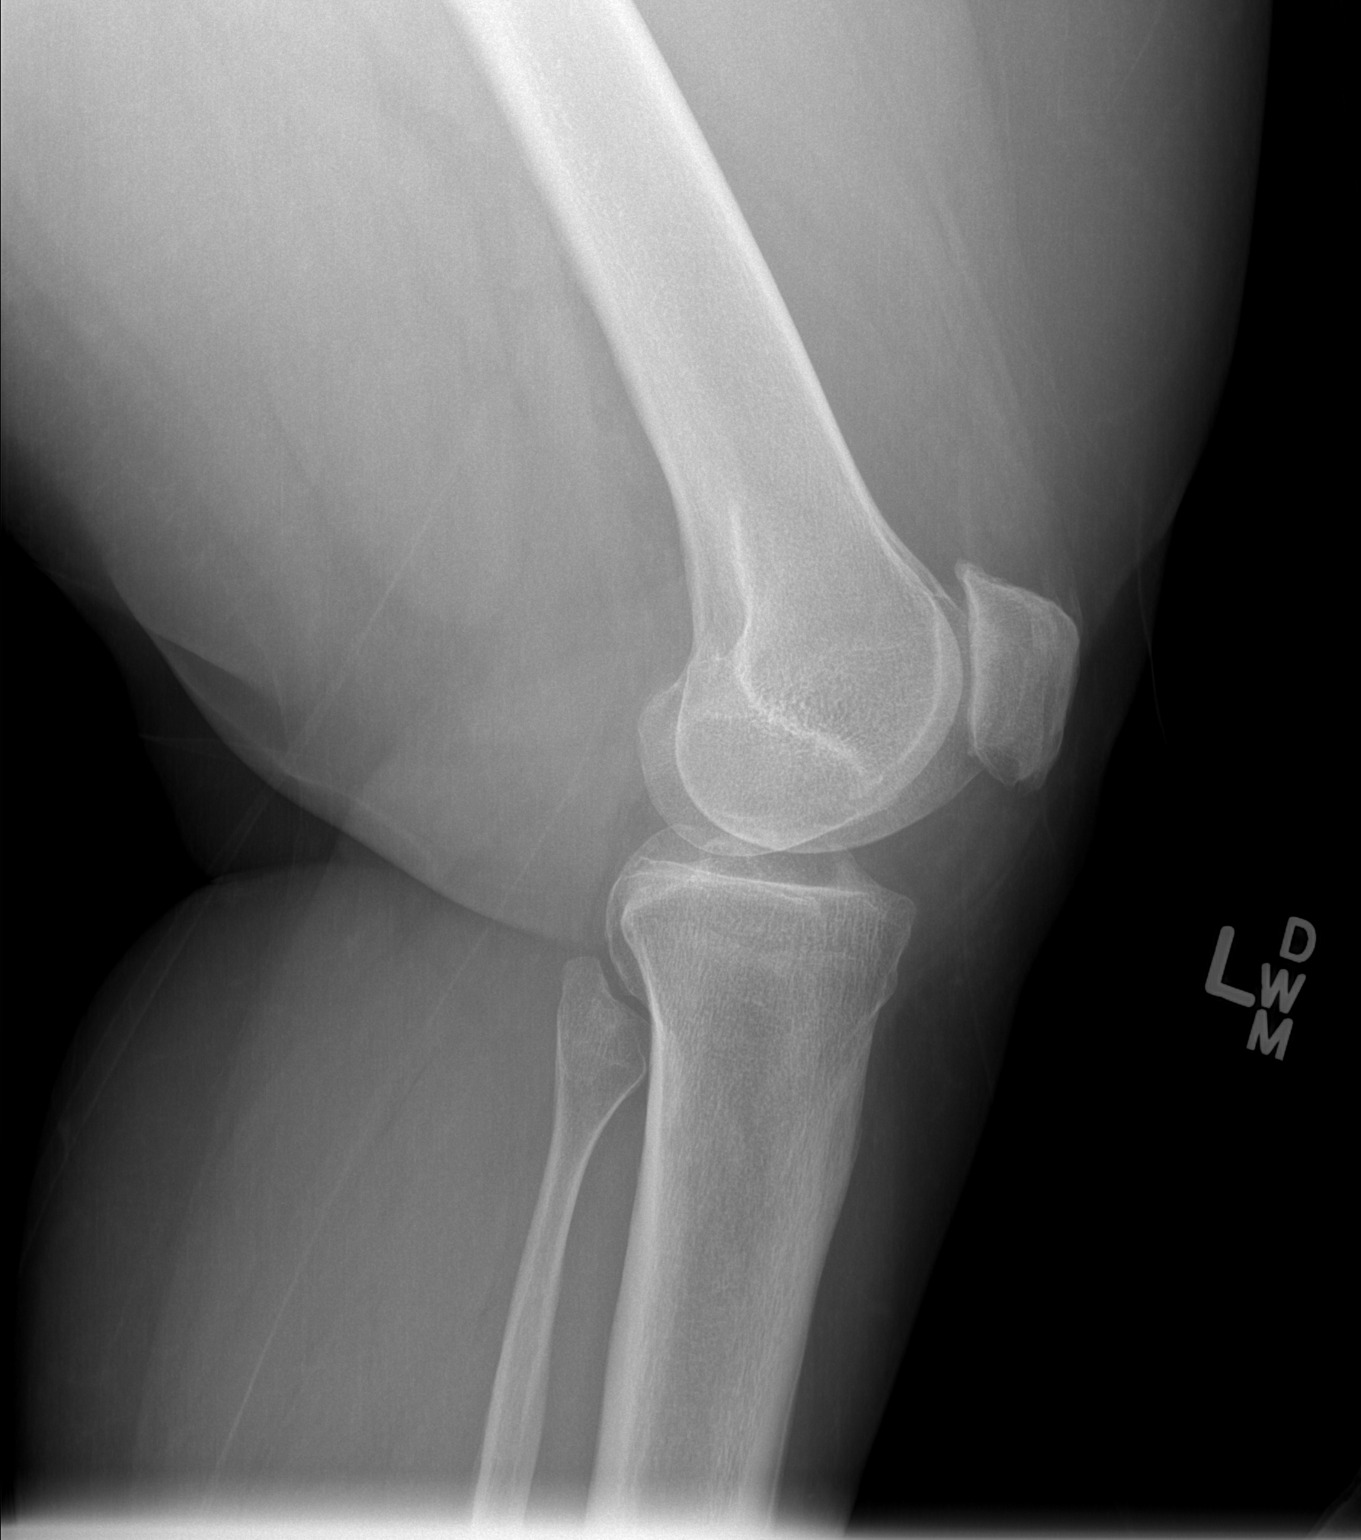

[2 of 2 positions shown; findings below may reference images not displayed]

FINDINGS: Mild degenerative joint disease changes within the left knee
including joint space narrowing and spurring in all 3 compartments,
most pronounced in the patellofemoral compartment. No acute bony
abnormality. Specifically, no fracture, subluxation, or dislocation.
Soft tissues are intact. A joint effusion.
IMPRESSION: Mild tricompartment degenerative changes.  No acute findings.

## 2014-01-07 MED ORDER — NAPROXEN 500 MG PO TABS: 500.0000 mg | ORAL_TABLET | Freq: Two times a day (BID) | ORAL | Status: DC

## 2014-01-07 MED ORDER — HYDROCODONE-ACETAMINOPHEN 5-325 MG PO TABS: 1.0000 | ORAL_TABLET | ORAL | Status: DC | PRN

## 2014-01-07 NOTE — ED Provider Notes (Signed)
CSN: [id number]     Arrival date & time 01/07/14  1603 History   First MD Initiated Contact with Patient 01/07/14 1721     Chief Complaint  Patient presents with  . Leg Pain     (Consider location/radiation/quality/duration/timing/severity/associated sxs/prior Treatment) Patient is a 58 y.o. female presenting with leg pain. The history is provided by the patient and medical records.  Leg Pain  This is a 58 y.o. F with PMH significant for HTN, DM, thyroid disease, presenting to the ED for left foot pain.  Triage note reports leg pain, however patient states it is her left foot.  States symptoms just started yesterday, described as a dull ache, worse along her left great toe.  Pain worse with weight bearing and ambulation.  Denies recent injury, trauma, or falls.  States she feels her foot is mildly swollen.  Patient does have significant history of arthritis, unsure if this is what is causing her symptoms. Patient also notes some subjective paresthesias in her left great toe. She denies numbness or weakness.  No prior hx of gout.  No intervention tried PTA.  Past Medical History  Diagnosis Date  . Diabetes mellitus without complication   . Hypertension   . Thyroid disease    Past Surgical History  Procedure Laterality Date  . Thyroidectomy    . Abdominal hysterectomy     No family history on file. History  Substance Use Topics  . Smoking status: Never Smoker   . Smokeless tobacco: Not on file  . Alcohol Use: No   OB History    No data available     Review of Systems  Musculoskeletal: Positive for arthralgias.  All other systems reviewed and are negative.     Allergies  Tape  Home Medications   Prior to Admission medications   Medication Sig Start Date End Date Taking? Authorizing Provider  aspirin 81 MG tablet Take 81 mg by mouth daily.   Yes Historical Provider, MD  hydrochlorothiazide (HYDRODIURIL) 25 MG tablet Take 25 mg by mouth daily.   Yes Historical  Provider, MD  Levothyroxine Sodium (TIROSINT) 137 MCG CAPS Take by mouth.   Yes Historical Provider, MD  metFORMIN (GLUCOPHAGE) 500 MG tablet Take 1,000 mg by mouth 2 (two) times daily with a meal.   Yes Historical Provider, MD  omeprazole (PRILOSEC) 20 MG capsule Take 20 mg by mouth daily.   Yes Historical Provider, MD  pindolol (VISKEN) 10 MG tablet Take 20 mg by mouth 2 (two) times daily.   Yes Historical Provider, MD  Exenatide (BYETTA 5 MCG PEN [place]) Inject into the skin.    Historical Provider, MD  ibuprofen (ADVIL,MOTRIN) 600 MG tablet Take 1 tablet (600 mg total) by mouth every 6 (six) hours as needed for pain. 10/21/11   Threasa Beards, MD  lisinopril-hydrochlorothiazide (PRINZIDE,ZESTORETIC) 20-25 MG per tablet Take 1 tablet by mouth daily.    Historical Provider, MD   BP 125/75 mmHg  Pulse 93  Temp(Src) 98.5 F (36.9 C) (Oral)  Resp 18  Ht 5' 2.5" (1.588 m)  Wt 234 lb (106.142 kg)  BMI 42.09 kg/m2  SpO2 99%   Physical Exam  Constitutional: She is oriented to person, place, and time. She appears well-developed and well-nourished. No distress.  HENT:  Head: Normocephalic and atraumatic.  Mouth/Throat: Oropharynx is clear and moist.  Eyes: Conjunctivae and EOM are normal. Pupils are equal, round, and reactive to light.  Neck: Normal range of motion. Neck supple.  Cardiovascular: Normal  rate, regular rhythm and normal heart sounds.   Pulmonary/Chest: Effort normal and breath sounds normal. No respiratory distress. She has no wheezes.  Musculoskeletal: Normal range of motion.       Left knee: Normal.       Left ankle: Normal.       Left lower leg: Normal.       Left foot: There is tenderness and bony tenderness. There is no crepitus and no deformity.       Feet:  Left foot with mild tenderness of dorsal aspect, worse along great toe; no bony deformity, swelling, erythema, or signs of infection present; DP pulse intact; normal sensation throughout foot  Neurological: She is  alert and oriented to person, place, and time.  Skin: Skin is warm and dry. She is not diaphoretic.  Psychiatric: She has a normal mood and affect.  Nursing note and vitals reviewed.   ED Course  Procedures (including critical care time) Labs Review Labs Reviewed - No data to display  Imaging Review Dg Knee 1-2 Views Left  01/07/2014   CLINICAL DATA:  Pain, tingling in left lower extremity from knee to toes. No known injury. Cannot bear weight. Initial encounter.  EXAM: LEFT KNEE - 1-2 VIEW  COMPARISON:  None.  FINDINGS: Mild degenerative joint disease changes within the left knee including joint space narrowing and spurring in all 3 compartments, most pronounced in the patellofemoral compartment. No acute bony abnormality. Specifically, no fracture, subluxation, or dislocation. Soft tissues are intact. A joint effusion.  IMPRESSION: Mild tricompartment degenerative changes.  No acute findings.   Electronically Signed   By: Rolm Baptise M.D.   On: 01/07/2014 16:40   Dg Tibia/fibula Left  01/07/2014   CLINICAL DATA:  Pain in LEFT leg that started today, tingling in foot  EXAM: LEFT TIBIA AND FIBULA - 2 VIEW  COMPARISON:  None  FINDINGS: Osseous demineralization.  Mild patellofemoral joint space narrowing and minimal spur formation.  No acute fracture, dislocation or bone destruction.  Small plantar and Achilles insertion calcaneal spurs.  IMPRESSION: Osseous demineralization with minimal degenerative changes LEFT knee.  No acute abnormalities.   Electronically Signed   By: Lavonia Dana M.D.   On: 01/07/2014 16:40     EKG Interpretation None      MDM   Final diagnoses:  Left foot pain   58 y.o. F with atraumatic left foot pain.  Triage note states left leg pain, however patient denies pain in her leg, states his only in her foot. On exam, there is no bony deformities of left foot.  There is mild tenderness of the dorsal aspect of left foot, worse along great toe-- ?questionable gout.   Foot remains neurovascularly intact without signs of septic joint. Imaging of left knee and tib/fib were obtained in triage (this is not the location of patient's pain), however without recent injury, do not feel imaging of left foot will change management here in the ED.  Patient started on Naprosyn and Vicodin. She is encouraged follow-up with her primary care physician.  Discussed plan with patient, he/she acknowledged understanding and agreed with plan of care.  Return precautions given for new or worsening symptoms.  Larene Pickett, PA-C 01/07/14 2320  Veryl Speak, MD 01/08/14 [phone]

## 2014-01-07 NOTE — Discharge Instructions (Signed)
Take the prescribed medication as directed.  May wish to ice and elevate foot to help with pain/swelling. Follow-up with your primary care physician. Return to the ED for new or worsening symptoms.

## 2014-01-07 NOTE — ED Notes (Signed)
Pt presents to ED with complaints of left leg pain that started today and tingling in her left foot.Marland Kitchen

## 2015-07-28 ENCOUNTER — Encounter (HOSPITAL_BASED_OUTPATIENT_CLINIC_OR_DEPARTMENT_OTHER): Payer: Self-pay | Admitting: Emergency Medicine

## 2015-07-28 ENCOUNTER — Emergency Department (HOSPITAL_BASED_OUTPATIENT_CLINIC_OR_DEPARTMENT_OTHER)
Admission: EM | Admit: 2015-07-28 | Discharge: 2015-07-28 | Disposition: A | Discharge status: Home / Self Care | Payer: No Typology Code available for payment source | Attending: Dermatology | Admitting: Dermatology

## 2015-07-28 DIAGNOSIS — Z79899 Other long term (current) drug therapy: Secondary | ICD-10-CM | POA: Insufficient documentation

## 2015-07-28 DIAGNOSIS — I10 Essential (primary) hypertension: Secondary | ICD-10-CM | POA: Insufficient documentation

## 2015-07-28 DIAGNOSIS — R0602 Shortness of breath: Secondary | ICD-10-CM | POA: Insufficient documentation

## 2015-07-28 DIAGNOSIS — Z7982 Long term (current) use of aspirin: Secondary | ICD-10-CM | POA: Insufficient documentation

## 2015-07-28 DIAGNOSIS — R079 Chest pain, unspecified: Secondary | ICD-10-CM | POA: Insufficient documentation

## 2015-07-28 DIAGNOSIS — E119 Type 2 diabetes mellitus without complications: Secondary | ICD-10-CM | POA: Insufficient documentation

## 2015-07-28 DIAGNOSIS — Z7984 Long term (current) use of oral hypoglycemic drugs: Secondary | ICD-10-CM | POA: Insufficient documentation

## 2015-07-28 DIAGNOSIS — Z5321 Procedure and treatment not carried out due to patient leaving prior to being seen by health care provider: Secondary | ICD-10-CM | POA: Insufficient documentation

## 2015-07-28 NOTE — ED Notes (Signed)
Patient reports chest pain starting last night and increased SOB with excursion

## 2015-07-28 NOTE — ED Notes (Signed)
Patient states that she no longer wants to wait, will follow up with her PMD

## 2016-08-08 ENCOUNTER — Emergency Department (HOSPITAL_BASED_OUTPATIENT_CLINIC_OR_DEPARTMENT_OTHER): Payer: 59

## 2016-08-08 ENCOUNTER — Emergency Department (HOSPITAL_BASED_OUTPATIENT_CLINIC_OR_DEPARTMENT_OTHER)
Admission: EM | Admit: 2016-08-08 | Discharge: 2016-08-08 | Disposition: A | Discharge status: Home / Self Care | Payer: 59 | Attending: Emergency Medicine | Admitting: Emergency Medicine

## 2016-08-08 ENCOUNTER — Encounter (HOSPITAL_BASED_OUTPATIENT_CLINIC_OR_DEPARTMENT_OTHER): Payer: Self-pay

## 2016-08-08 DIAGNOSIS — E079 Disorder of thyroid, unspecified: Secondary | ICD-10-CM | POA: Diagnosis not present

## 2016-08-08 DIAGNOSIS — Z79899 Other long term (current) drug therapy: Secondary | ICD-10-CM | POA: Diagnosis not present

## 2016-08-08 DIAGNOSIS — Y999 Unspecified external cause status: Secondary | ICD-10-CM | POA: Diagnosis not present

## 2016-08-08 DIAGNOSIS — I10 Essential (primary) hypertension: Secondary | ICD-10-CM | POA: Insufficient documentation

## 2016-08-08 DIAGNOSIS — S86911A Strain of unspecified muscle(s) and tendon(s) at lower leg level, right leg, initial encounter: Secondary | ICD-10-CM | POA: Insufficient documentation

## 2016-08-08 DIAGNOSIS — Y929 Unspecified place or not applicable: Secondary | ICD-10-CM | POA: Diagnosis not present

## 2016-08-08 DIAGNOSIS — E119 Type 2 diabetes mellitus without complications: Secondary | ICD-10-CM | POA: Insufficient documentation

## 2016-08-08 DIAGNOSIS — Z7984 Long term (current) use of oral hypoglycemic drugs: Secondary | ICD-10-CM | POA: Insufficient documentation

## 2016-08-08 DIAGNOSIS — S8991XA Unspecified injury of right lower leg, initial encounter: Secondary | ICD-10-CM | POA: Diagnosis present

## 2016-08-08 DIAGNOSIS — Z7982 Long term (current) use of aspirin: Secondary | ICD-10-CM | POA: Insufficient documentation

## 2016-08-08 DIAGNOSIS — Y93I9 Activity, other involving external motion: Secondary | ICD-10-CM | POA: Diagnosis not present

## 2016-08-08 MED ORDER — IBUPROFEN 800 MG PO TABS
800.0000 mg | ORAL_TABLET | Freq: Once | ORAL | Status: AC
  Administered 2016-08-08: 800 mg via ORAL
  Filled 2016-08-08: qty 1
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
## 2016-08-08 NOTE — Discharge Instructions (Signed)
Expect to be stiff and sore for the next few days. Take Tylenol or ibuprofen as needed
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
## 2016-08-17 ENCOUNTER — Ambulatory Visit (HOSPITAL_COMMUNITY)
Admission: RE | Admit: 2016-08-17 | Discharge: 2016-08-17 | Disposition: A | Discharge status: Home / Self Care | Payer: 59 | Source: Ambulatory Visit | Attending: Physician Assistant | Admitting: Physician Assistant

## 2016-08-17 ENCOUNTER — Other Ambulatory Visit: Payer: Self-pay | Admitting: Physician Assistant

## 2016-08-17 DIAGNOSIS — M79661 Pain in right lower leg: Secondary | ICD-10-CM

## 2016-08-17 NOTE — Progress Notes (Signed)
VASCULAR LAB PRELIMINARY  PRELIMINARY  PRELIMINARY  PRELIMINARY  Right lower extremity venous duplex completed.    Preliminary report:  There is no DVT, SVT, or Baker's cyst noted in the right lower extremity.  Aliha Diedrich, RVT 08/17/2016, 2:33 PM

## 2017-03-08 ENCOUNTER — Emergency Department (HOSPITAL_BASED_OUTPATIENT_CLINIC_OR_DEPARTMENT_OTHER)
Admission: EM | Admit: 2017-03-08 | Discharge: 2017-03-08 | Disposition: A | Discharge status: Home / Self Care | Payer: 59 | Attending: Emergency Medicine | Admitting: Emergency Medicine

## 2017-03-08 ENCOUNTER — Emergency Department (HOSPITAL_COMMUNITY): Payer: 59

## 2017-03-08 ENCOUNTER — Encounter (HOSPITAL_BASED_OUTPATIENT_CLINIC_OR_DEPARTMENT_OTHER): Payer: Self-pay | Admitting: Emergency Medicine

## 2017-03-08 ENCOUNTER — Emergency Department (HOSPITAL_BASED_OUTPATIENT_CLINIC_OR_DEPARTMENT_OTHER): Payer: 59

## 2017-03-08 ENCOUNTER — Other Ambulatory Visit: Payer: Self-pay

## 2017-03-08 DIAGNOSIS — Z7982 Long term (current) use of aspirin: Secondary | ICD-10-CM | POA: Insufficient documentation

## 2017-03-08 DIAGNOSIS — R200 Anesthesia of skin: Secondary | ICD-10-CM | POA: Insufficient documentation

## 2017-03-08 DIAGNOSIS — R079 Chest pain, unspecified: Secondary | ICD-10-CM | POA: Insufficient documentation

## 2017-03-08 DIAGNOSIS — Z7984 Long term (current) use of oral hypoglycemic drugs: Secondary | ICD-10-CM | POA: Diagnosis not present

## 2017-03-08 DIAGNOSIS — Z79899 Other long term (current) drug therapy: Secondary | ICD-10-CM | POA: Diagnosis not present

## 2017-03-08 DIAGNOSIS — G43809 Other migraine, not intractable, without status migrainosus: Secondary | ICD-10-CM | POA: Diagnosis not present

## 2017-03-08 DIAGNOSIS — R209 Unspecified disturbances of skin sensation: Secondary | ICD-10-CM

## 2017-03-08 DIAGNOSIS — I10 Essential (primary) hypertension: Secondary | ICD-10-CM | POA: Diagnosis not present

## 2017-03-08 DIAGNOSIS — R51 Headache: Secondary | ICD-10-CM

## 2017-03-08 DIAGNOSIS — R0602 Shortness of breath: Secondary | ICD-10-CM | POA: Insufficient documentation

## 2017-03-08 DIAGNOSIS — R519 Headache, unspecified: Secondary | ICD-10-CM

## 2017-03-08 DIAGNOSIS — E119 Type 2 diabetes mellitus without complications: Secondary | ICD-10-CM | POA: Insufficient documentation

## 2017-03-08 DIAGNOSIS — R202 Paresthesia of skin: Secondary | ICD-10-CM

## 2017-03-08 LAB — BASIC METABOLIC PANEL
ANION GAP: 10 (ref 5–15)
BUN: 11 mg/dL (ref 6–20)
CALCIUM: 8.8 mg/dL — AB (ref 8.9–10.3)
CHLORIDE: 103 mmol/L (ref 101–111)
CO2: 27 mmol/L (ref 22–32)
Creatinine, Ser: 0.95 mg/dL (ref 0.44–1.00)
GFR calc Af Amer: >60 mL/min (ref >60)
GFR calc non Af Amer: >60 mL/min (ref >60)
Glucose, Bld: 142 mg/dL — ABNORMAL HIGH (ref 65–99)
Potassium: 3.4 mmol/L — ABNORMAL LOW (ref 3.5–5.1)
Sodium: 140 mmol/L (ref 135–145)

## 2017-03-08 LAB — APTT: aPTT: 31 seconds (ref 24–36)

## 2017-03-08 LAB — CBC WITH DIFFERENTIAL/PLATELET
BASOS PCT: 0 %
Basophils Absolute: 0 10*3/uL (ref 0.0–0.1)
Eosinophils Absolute: 0.1 10*3/uL (ref 0.0–0.7)
Eosinophils Relative: 1 %
HEMATOCRIT: 36.1 % (ref 36.0–46.0)
Hemoglobin: 11.9 g/dL — ABNORMAL LOW (ref 12.0–15.0)
Lymphocytes Relative: 32 %
Lymphs Abs: 2 10*3/uL (ref 0.7–4.0)
MCH: 28.5 pg (ref 26.0–34.0)
MCHC: 33 g/dL (ref 30.0–36.0)
MCV: 86.4 fL (ref 78.0–100.0)
MONOS PCT: 7 %
Monocytes Absolute: 0.5 10*3/uL (ref 0.1–1.0)
Neutro Abs: 3.7 10*3/uL (ref 1.7–7.7)
Neutrophils Relative %: 60 %
Platelets: 331 10*3/uL (ref 150–400)
RBC: 4.18 MIL/uL (ref 3.87–5.11)
RDW: 13.8 % (ref 11.5–15.5)
WBC: 6.3 10*3/uL (ref 4.0–10.5)

## 2017-03-08 LAB — URINALYSIS, ROUTINE W REFLEX MICROSCOPIC
Bilirubin Urine: NEGATIVE
Glucose, UA: NEGATIVE mg/dL
Hgb urine dipstick: NEGATIVE
Ketones, ur: NEGATIVE mg/dL
LEUKOCYTES UA: NEGATIVE
NITRITE: NEGATIVE
PH: 7 (ref 5.0–8.0)
Protein, ur: NEGATIVE mg/dL
SPECIFIC GRAVITY, URINE: 1.015 (ref 1.005–1.030)

## 2017-03-08 LAB — RAPID URINE DRUG SCREEN, HOSP PERFORMED
Amphetamines: NOT DETECTED
Barbiturates: NOT DETECTED
Benzodiazepines: NOT DETECTED
Cocaine: NOT DETECTED
OPIATES: NOT DETECTED
Tetrahydrocannabinol: NOT DETECTED

## 2017-03-08 LAB — PROTIME-INR
INR: 1
PROTHROMBIN TIME: 13.1 s (ref 11.4–15.2)

## 2017-03-08 LAB — HEPATIC FUNCTION PANEL
ALBUMIN: 3.6 g/dL (ref 3.5–5.0)
ALK PHOS: 110 U/L (ref 38–126)
ALT: 16 U/L (ref 14–54)
AST: 18 U/L (ref 15–41)
Bilirubin, Direct: 0.1 mg/dL (ref 0.1–0.5)
Indirect Bilirubin: 0.2 mg/dL — ABNORMAL LOW (ref 0.3–0.9)
TOTAL PROTEIN: 7.1 g/dL (ref 6.5–8.1)
Total Bilirubin: 0.3 mg/dL (ref 0.3–1.2)

## 2017-03-08 LAB — TROPONIN I: Troponin I: <0.03 ng/mL (ref <0.03)

## 2017-03-08 LAB — ETHANOL

## 2017-03-08 MED ORDER — DIPHENHYDRAMINE HCL 50 MG/ML IJ SOLN
25.0000 mg | Freq: Once | INTRAMUSCULAR | Status: AC
  Administered 2017-03-08: 25 mg via INTRAVENOUS
  Filled 2017-03-08: qty 1

## 2017-03-08 MED ORDER — DIPHENHYDRAMINE HCL 50 MG/ML IJ SOLN
25.0000 mg | Freq: Once | INTRAMUSCULAR | Status: DC
  Filled 2017-03-08: qty 1

## 2017-03-08 MED ORDER — PROCHLORPERAZINE EDISYLATE 5 MG/ML IJ SOLN
10.0000 mg | Freq: Once | INTRAMUSCULAR | Status: AC
  Administered 2017-03-08: 10 mg via INTRAVENOUS
  Filled 2017-03-08: qty 2

## 2017-03-08 NOTE — ED Notes (Signed)
Pt c/o muscle twitching and jerking after receiving compazine. States headache is gone "I don't feel it". Dr. Johnney Killian notified.

## 2017-03-08 NOTE — Discharge Instructions (Signed)
Your MRI studies are negative for stroke or other acute event. You can be discharged home and should make an appointment with [facility] for recheck and further management of symptoms.

## 2017-03-08 NOTE — ED Notes (Signed)
Spoke with Roselyn Reef, RN [facility] ED charge nurse) to notify of pt transfer to ED for MRI

## 2017-03-08 NOTE — ED Provider Notes (Signed)
Patient arrives as transfer from [facility] for MRI brain in evaluation of headache and unilateral UE numbness and facial tingling that started yesterday - r/o CVA. Discussed with Dr. Lorraine Lax prior to transfer with plan established: if study is negative, she can be discharged home to outpatient neurology for follow up: if positive, he will see in the ED in consultation.  Introduced myself to the patient and her husband. No needs at this time. She is resting comfortably.  7:40 - MRI results are negative for acute abnormality. Recheck on the patient finds her comfortable. She states she has a mild residual headache but medications (headache cocktail) improved the symptoms significantly.   Per plan in place, the patient can be discharged home with outpatient neurology follow up.     Charlann Lange, PA-C 03/08/17 [place], MD 03/09/17 [phone]

## 2017-03-08 NOTE — ED Notes (Signed)
Pt transported to MRI 

## 2017-03-08 NOTE — ED Provider Notes (Signed)
[facility] EMERGENCY DEPARTMENT Provider Note   CSN: [id number] Arrival date & time: 03/08/17  7408     History   Chief Complaint Chief Complaint  Patient presents with  . Chest Pain    HPI Terri Waters is a 62 y.o. female.  HPI Patient reports that yesterday she had gradual onset of left-sided headache.  It has pressure aching quality.  She reports that not long after the headache is started she noted a numb feeling of her left arm and hand.  This was fairly brief only a few minutes.  A few hours later she noticed tingling in the left side of her face.  The arm symptoms did not return at that time.  The patient laid down to rest so she is not sure how long the facial symptoms persisted.  This morning, she awakened and after being awake for several hours noticed again, tingling numb sensation on the left half of her face.  She did contact her PCP office regarding her symptoms and was advised to come to the emergency department.  She denies any visual loss or double vision.  No lower extremity symptoms.  Patient has very distant migraine history.  She reports it was greater than 20 years ago and was treated with Imitrex.  She reports she never had associated numbness or tingling sensations with her migraines. Past Medical History:  Diagnosis Date  . Diabetes mellitus without complication ([place])   . Hypertension   . Thyroid disease     There are no active problems to display for this patient.   Past Surgical History:  Procedure Laterality Date  . ABDOMINAL HYSTERECTOMY    . THYROIDECTOMY      OB History    No data available       Home Medications    Prior to Admission medications   Medication Sig Start Date End Date Taking? Authorizing Provider  aspirin 81 MG tablet Take 81 mg by mouth daily.    [provider]  Exenatide (BYETTA 5 MCG PEN [place]) Inject into the skin.    [provider]  hydrochlorothiazide (HYDRODIURIL) 25 MG tablet Take 25  mg by mouth daily.    [provider]  HYDROcodone-acetaminophen (NORCO/VICODIN) 5-325 MG per tablet Take 1 tablet by mouth every 4 (four) hours as needed. 01/07/14   Larene Pickett, PA-C  ibuprofen (ADVIL,MOTRIN) 600 MG tablet Take 1 tablet (600 mg total) by mouth every 6 (six) hours as needed for pain. 10/21/11   Pixie Casino, MD  Levothyroxine Sodium (TIROSINT) 137 MCG CAPS Take by mouth.    [provider]  Liraglutide (VICTOZA ) Inject into the skin.    [provider]  lisinopril-hydrochlorothiazide (PRINZIDE,ZESTORETIC) 20-25 MG per tablet Take 1 tablet by mouth daily.    [provider]  metFORMIN (GLUCOPHAGE) 500 MG tablet Take 1,000 mg by mouth 2 (two) times daily with a meal.    [provider]  naproxen (NAPROSYN) 500 MG tablet Take 1 tablet (500 mg total) by mouth 2 (two) times daily with a meal. 01/07/14   Larene Pickett, PA-C  omeprazole (PRILOSEC) 20 MG capsule Take 20 mg by mouth daily.    [provider]  pindolol (VISKEN) 10 MG tablet Take 20 mg by mouth 2 (two) times daily.    [provider]    Family History No family history on file.  Social History Social History   Tobacco Use  . Smoking status: Never Smoker  Substance Use Topics  . Alcohol use: No  . Drug use: No     Allergies   Tape   Review of Systems Review of Systems 10 Systems reviewed and are negative for acute change except as noted in the HPI.   Physical Exam Updated Vital Signs BP 119/71   Pulse 83   Temp 98.7 F (37.1 C) (Oral)   Resp (!) 24   SpO2 97%   Physical Exam  Constitutional: She appears well-developed and well-nourished. No distress.  HENT:  Head: Normocephalic and atraumatic.  Right Ear: External ear normal.  Left Ear: External ear normal.  Nose: Nose normal.  Mouth/Throat: Oropharynx is clear and moist.  Eyes: Conjunctivae and EOM are normal. Pupils are equal, round, and reactive to light.  Neck: Neck  supple.  Cardiovascular: Normal rate, regular rhythm, normal heart sounds and intact distal pulses.  Pulmonary/Chest: Effort normal and breath sounds normal.  Abdominal: Soft. Bowel sounds are normal. She exhibits no distension. There is no tenderness. There is no guarding.  Musculoskeletal: Normal range of motion. She exhibits no edema or tenderness.  Neurological:  Cranial nerves II through XII are intact.  Patient does endorse sensory differential on lower half of left face.  Motor strength 5\5 upper extremities.  No pronator drift.  Patient endorses slight difference to light touch on left hand.  Lower extremity strength 5\5.  Sensation equal to light touch bilateral lower extremities.  Skin: Skin is warm and dry.  Psychiatric: She has a normal mood and affect.     ED Treatments / Results  Labs (all labs ordered are listed, but only abnormal results are displayed) Labs Reviewed  BASIC METABOLIC PANEL - Abnormal; Notable for the following components:      Result Value   Potassium 3.4 (*)    Glucose, Bld 142 (*)    Calcium 8.8 (*)    All other components within normal limits  CBC WITH DIFFERENTIAL/PLATELET - Abnormal; Notable for the following components:   Hemoglobin 11.9 (*)    All other components within normal limits  HEPATIC FUNCTION PANEL - Abnormal; Notable for the following components:   Indirect Bilirubin 0.2 (*)    All other components within normal limits  TROPONIN I  ETHANOL  PROTIME-INR  APTT  RAPID URINE DRUG SCREEN, HOSP PERFORMED  URINALYSIS, ROUTINE W REFLEX MICROSCOPIC    EKG  EKG Interpretation  Date/Time:  Sunday March 08 2017 09:58:45 EST Ventricular Rate:  87 PR Interval:  126 QRS Duration: 68 QT Interval:  376 QTC Calculation: 452 R Axis:   59 Text Interpretation:  Normal sinus rhythm Low voltage QRS Borderline ECG no sig change from previous Confirmed by Charlesetta Shanks [phone]) on 03/08/2017 11:09:27 AM       Radiology Dg Chest 2  View  Result Date: 03/08/2017 CLINICAL DATA:  Headache starting yesterday. Mid chest pain and shortness of breath. EXAM: CHEST  2 VIEW COMPARISON:  01/09/2016 FINDINGS: Mild enlargement of the cardiopericardial silhouette, without edema. Mild thoracic spondylosis. The lungs appear clear. No pleural effusion. Spurring of the right humeral head. IMPRESSION: 1. Mild enlargement of the cardiopericardial silhouette, without edema. Electronically Signed   By: Van Clines M.D.   On: 03/08/2017 10:22   Ct Head Wo Contrast  Result Date: 03/08/2017 CLINICAL DATA:  TIA. Right facial numbness. Right hand tingling. No reported injury. EXAM: CT HEAD WITHOUT CONTRAST TECHNIQUE: Contiguous axial images were obtained from the base of the skull through the vertex without intravenous contrast. COMPARISON:  None. FINDINGS: Brain: No evidence of parenchymal hemorrhage or extra-axial fluid collection. No mass lesion, mass effect, or midline shift. No CT evidence of acute infarction. Nonspecific mild subcortical and periventricular white matter hypodensity, most in keeping with chronic small vessel ischemic change. Cerebral volume is age appropriate. No ventriculomegaly. Vascular: No acute abnormality. Skull: No evidence of calvarial fracture. Sinuses/Orbits: The visualized paranasal sinuses are essentially clear. Other:  The mastoid air cells are unopacified. IMPRESSION: 1.  No evidence of acute intracranial abnormality. 2. Mild chronic small vessel ischemic changes in the cerebral white matter. Electronically Signed   By: Ilona Sorrel M.D.   On: 03/08/2017 12:04    Procedures Procedures (including critical care time)  Medications Ordered in ED Medications  prochlorperazine (COMPAZINE) injection 10 mg (not administered)  diphenhydrAMINE (BENADRYL) injection 25 mg (not administered)     Initial Impression / Assessment and Plan / ED Course  I have reviewed the triage vital signs and the nursing  notes.  Pertinent labs & imaging results that were available during my care of the patient were reviewed by me and considered in my medical decision making (see chart for details).    Consult: Reviewed with Dr. Malen Gauze of neurology.  Advises proceed with MRI and also give migraine cocktail.  Symptoms are resolved and MRI is negative, may discharge to follow-up with neurology as outpatient.  If symptoms persist or MRI anomaly identified, neurology to see patient as consultation in emergency department.  Reviewed with Dr. Coralyn Pear who is accepting ED doctor for  emergency department transfer.  Final Clinical Impressions(s) / ED Diagnoses   Final diagnoses:  Right arm numbness  Facial paresthesia  Acute nonintractable headache, unspecified headache type   Patient presents as outlined above with waxing and waning right sided arm numbness and right sided facial numbness and paresthesia.  Has remained unilateral.  Patient does not have any cognitive or motor deficit.  No signs of infectious etiology.  Patient is alert and appropriate.  Plan will be as per review with Dr. Malen Gauze to start treatment for migraine and pursue MRI evaluation.  ED Discharge Orders    None       Charlesetta Shanks, MD 03/08/17 [id number]

## 2017-03-08 NOTE — ED Triage Notes (Signed)
Migraine H/A since yesterday and this am has pressure to mid chest area with SOB

## 2017-03-08 NOTE — ED Notes (Signed)
At bedside with ordered benadryl. Pt states Sx are resolving and wants to hold on taking meds at this time. Cao x 4. Denies chest pain. Rates headache 1/10. [facility] at bedside

## 2017-04-09 ENCOUNTER — Encounter: Payer: Self-pay | Admitting: Neurology

## 2017-04-09 ENCOUNTER — Ambulatory Visit (INDEPENDENT_AMBULATORY_CARE_PROVIDER_SITE_OTHER): Payer: 59 | Admitting: Neurology

## 2017-04-09 VITALS — BP 123/76 | HR 74 | Ht 61.0 in | Wt 230.0 lb

## 2017-04-09 DIAGNOSIS — F1523 Other stimulant dependence with withdrawal: Secondary | ICD-10-CM

## 2017-04-09 DIAGNOSIS — G43109 Migraine with aura, not intractable, without status migrainosus: Secondary | ICD-10-CM | POA: Insufficient documentation

## 2017-04-09 DIAGNOSIS — F1593 Other stimulant use, unspecified with withdrawal: Secondary | ICD-10-CM

## 2017-04-09 NOTE — Progress Notes (Signed)
[facility]    Provider:  Dr Jaynee Eagles Referring Provider: Maylon Peppers, MD Primary Care Physician:  Maylon Peppers, MD  CC:  Headache  HPI:  Terri Waters is a 62 y.o. female here as a referral from Dr. Lenna Gilford for headache.  Past medical history thyroid disease, migraines, hypertension, high cholesterol, diabetes. On Saturday she had a horrible headache in the setting of lots of mountain dew use and she decided to stop the [facility].  Patient is here and also provide much information.  She woke up with a band around her head. Headache, wouldn't go away even with Tylenol. Sleep didn't help. It was throbbing, +phono/photophobia, a dark room helped but kept getting worse, she was nauseated no vomiting.  She had a remote history of migraines. Did not present similarly however, she used to have aura of light followed by the headache. She hadn't had a migraine for 25 years. She gets headaches a few times a month triggered by reading. Husband is here and provides much information. She had a sleep study in January and no sleep apnea. She started having numbness on the right face. Her sight is changing. She keeps having blurry vision. She is also very agitated all the time. Lots of stress in the home. No hearing changes or pulsating in the ears. The headache finally stopped on Monday after the weekend and no issues since then.  She saw her endocrinologist and thyroid medication was decreased earlier this month because   Reviewed notes, labs and imaging from outside physicians, which showed:  Personally reviewed MRI of the brain and MRA of the head images which were unremarkable except for white matter disease likely chronic microvascular ischemia, reviewed images with patient and husband.  Urine rapid drug screen was negative, CBC with slight anemia, BMP with elevated glucose but largely unremarkable.  Review of Systems: Patient complains of symptoms per HPI as well as the  following symptoms: headache. Pertinent negatives and positives per HPI. All others negative.   Social History   Socioeconomic History  . Marital status: Married    Spouse name: Not on file  . Number of children: Not on file  . Years of education: Not on file  . Highest education level: Not on file  Occupational History  . Not on file  Social Needs  . Financial resource strain: Not on file  . Food insecurity:    Worry: Not on file    Inability: Not on file  . Transportation needs:    Medical: Not on file    Non-medical: Not on file  Tobacco Use  . Smoking status: Never Smoker  . Smokeless tobacco: Never Used  Substance and Sexual Activity  . Alcohol use: No  . Drug use: No  . Sexual activity: Not on file  Lifestyle  . Physical activity:    Days per week: Not on file    Minutes per session: Not on file  . Stress: Not on file  Relationships  . Social connections:    Talks on phone: Not on file    Gets together: Not on file    Attends religious service: Not on file    Active member of club or organization: Not on file    Attends meetings of clubs or organizations: Not on file    Relationship status: Not on file  . Intimate partner violence:    Fear of current or ex partner: Not on file    Emotionally abused: Not on  file    Physically abused: Not on file    Forced sexual activity: Not on file  Other Topics Concern  . Not on file  Social History Narrative  . Not on file    Family History  Problem Relation Age of Onset  . Heart disease Mother   . Chronic Renal Failure Mother   . Diabetes Mother   . Stroke Father   . Hypertension Father   . High Cholesterol Father   . Breast cancer Maternal Grandmother   . Colon cancer Maternal Grandmother   . Colon cancer Maternal Grandfather     Past Medical History:  Diagnosis Date  . Cancer (HCC)    papillary carcinoma  . Diabetes mellitus without complication ([place])   . High cholesterol   . Hypertension   .  Migraines   . Thyroid disease     Past Surgical History:  Procedure Laterality Date  . ABDOMINAL HYSTERECTOMY    . THYROIDECTOMY      Current Outpatient Medications  Medication Sig Dispense Refill  . aspirin 81 MG tablet Take 81 mg by mouth daily.    Marland Kitchen conjugated estrogens (PREMARIN) vaginal cream Place 1 Applicatorful vaginally daily as needed. Itching    . glimepiride (AMARYL) 2 MG tablet Take 1 mg by mouth 2 (two) times daily.    . hydrochlorothiazide (HYDRODIURIL) 25 MG tablet Take 25 mg by mouth daily.    Marland Kitchen levothyroxine (SYNTHROID, LEVOTHROID) 100 MCG tablet Take 137 mcg by mouth daily.    Marland Kitchen liraglutide (VICTOZA) 18 MG/3ML SOPN Inject 1.8 mg into the skin daily.    . metFORMIN (GLUCOPHAGE) 500 MG tablet Take 1,000 mg by mouth 2 (two) times daily with a meal.    . omeprazole (PRILOSEC) 20 MG capsule Take 20 mg by mouth daily.     No current facility-administered medications for this visit.     Allergies as of 04/09/2017 - Review Complete 04/09/2017  Allergen Reaction Noted  . Tape Rash 10/21/2011    Vitals: BP 123/76   Pulse 74   Ht 5\' 1"  (1.549 m)   Wt 230 lb (104.3 kg)   BMI 43.46 kg/m  Last Weight:  Wt Readings from Last 1 Encounters:  04/09/17 230 lb (104.3 kg)   Last Height:   Ht Readings from Last 1 Encounters:  04/09/17 5\' 1"  (1.549 m)    Physical exam: Exam: Gen: NAD, conversant, well nourised, obese, well groomed                     CV: RRR, no MRG. No Carotid Bruits. No peripheral edema, warm, nontender Eyes: Conjunctivae clear without exudates or hemorrhage  Neuro: Detailed Neurologic Exam  Speech:    Speech is normal; fluent and spontaneous with normal comprehension.  Cognition:    The patient is oriented to person, place, and time;     recent and remote memory intact;     language fluent;     normal attention, concentration,     fund of knowledge Cranial Nerves:    The pupils are equal, round, and reactive to light. The fundi are normal  and spontaneous venous pulsations are present. Visual fields are full to finger confrontation. Extraocular movements are intact. Trigeminal sensation is intact and the muscles of mastication are normal. The face is symmetric. The palate elevates in the midline. Hearing intact. Voice is normal. Shoulder shrug is normal. The tongue has normal motion without fasciculations.   Coordination:    Normal finger  to nose and heel to shin. Normal rapid alternating movements.   Gait:    Heel-toe and tandem gait are normal.   Motor Observation:    No asymmetry, no atrophy, and no involuntary movements noted. Tone:    Normal muscle tone.    Posture:    Posture is normal. normal erect    Strength:    Strength is V/V in the upper and lower limbs.      Sensation: intact to LT     Reflex Exam:  DTR's:    Deep tendon reflexes in the upper and lower extremities are symmetrical bilaterally.   Toes:    The toes are downgoing bilaterally.   Clonus:    Clonus is absent.      Assessment/Plan: This is a 62 year old patient with a remote history of migraines with aura who woke up with a severe headache in the setting of caffeine withdrawal, she drinks multiple Mountain Dew's a day and she decided to stop).  The headache resolved.  MRI of the brain and MRI of the head were unremarkable except for white matter changes likely chronic microvascular ischemia.  Discussed in detail with patient and husband.  Headache has resolved.  Neuro exam is nonfocal.   -Likely caffeine withdrawal headache.  Advised patient to slowly decrease caffeine and do not abruptly stop -MRI of the brain unremarkable except for chronic microvascular ischemia, discussed this and showed it to patient and her husband, this can be seen in normal aging but also with vascular risk factors such as high cholesterol, high blood pressure, diabetes, cigarette smoking and other vascular risk factors and advised her to follow closely with primary  care for management.  Discussed the following: There is increased risk for stroke in women with migraine with aura and a  Contraindication for the combined contraceptive pill for use by women who have migraine with aura, and contraindication for estrogen therapy, which is in line with World Health Organisation recommendations. The risk for women with migraine without aura is lower and other risk factors like smoking are far more likely to increase stroke risk than migraine. There is a recommendation for no smoking, management of risk factors, and for the use of low estrogen or progestogen only pills particularly for women with migraine with aura, discuss estrogen therapy with physician.  Discussed: To prevent or relieve headaches, try the following: Cool Compress. Lie down and place a cool compress on your head.  Avoid headache triggers. If certain foods or odors seem to have triggered your migraines in the past, avoid them. A headache diary might help you identify triggers.  Include physical activity in your daily routine. Try a daily walk or other moderate aerobic exercise.  Manage stress. Find healthy ways to cope with the stressors, such as delegating tasks on your to-do list.  Practice relaxation techniques. Try deep breathing, yoga, massage and visualization.  Eat regularly. Eating regularly scheduled meals and maintaining a healthy diet might help prevent headaches. Also, drink plenty of fluids.  Follow a regular sleep schedule. Sleep deprivation might contribute to headaches Consider biofeedback. With this mind-body technique, you learn to control certain bodily functions - such as muscle tension, heart rate and blood pressure - to prevent headaches or reduce headache pain.    Proceed to emergency room if you experience new or worsening symptoms or symptoms do not resolve, if you have new neurologic symptoms or if headache is severe, or for any concerning symptom.   Cc: Dr. Candelaria Celeste  Jaynee Eagles, [facility] [address] [place], [place] [address]  Phone [phone] Fax [phone]

## 2017-04-09 NOTE — Patient Instructions (Signed)
Migraine Headache A migraine headache is an intense, throbbing pain on one side or both sides of the head. Migraines may also cause other symptoms, such as nausea, vomiting, and sensitivity to light and noise. What are the causes? Doing or taking certain things may also trigger migraines, such as:  Alcohol.  Smoking.  Medicines, such as: ? Medicine used to treat chest pain (nitroglycerine). ? Birth control pills. ? Estrogen pills. ? Certain blood pressure medicines.  Aged cheeses, chocolate, or caffeine.  Foods or drinks that contain nitrates, glutamate, aspartame, or tyramine.  Physical activity.  Other things that may trigger a migraine include:  Menstruation.  Pregnancy.  Hunger.  Stress, lack of sleep, too much sleep, or fatigue.  Weather changes.  What increases the risk? The following factors may make you more likely to experience migraine headaches:  Age. Risk increases with age.  Family history of migraine headaches.  Being Caucasian.  Depression and anxiety.  Obesity.  Being a woman.  Having a hole in the heart (patent foramen ovale) or other heart problems.  What are the signs or symptoms? The main symptom of this condition is pulsating or throbbing pain. Pain may:  Happen in any area of the head, such as on one side or both sides.  Interfere with daily activities.  Get worse with physical activity.  Get worse with exposure to bright lights or loud noises.  Other symptoms may include:  Nausea.  Vomiting.  Dizziness.  General sensitivity to bright lights, loud noises, or smells.  Before you get a migraine, you may get warning signs that a migraine is developing (aura). An aura may include:  Seeing flashing lights or having blind spots.  Seeing bright spots, halos, or zigzag lines.  Having tunnel vision or blurred vision.  Having numbness or a tingling feeling.  Having trouble talking.  Having muscle weakness.  How is this  diagnosed? A migraine headache can be diagnosed based on:  Your symptoms.  A physical exam.  Tests, such as CT scan or MRI of the head. These imaging tests can help rule out other causes of headaches.  Taking fluid from the spine (lumbar puncture) and analyzing it (cerebrospinal fluid analysis, or CSF analysis).  How is this treated? A migraine headache is usually treated with medicines that:  Relieve pain.  Relieve nausea.  Prevent migraines from coming back.  Treatment may also include:  Acupuncture.  Lifestyle changes like avoiding foods that trigger migraines.  Follow these instructions at home: Medicines  Take over-the-counter and prescription medicines only as told by your health care provider.  Do not drive or use heavy machinery while taking prescription pain medicine.  To prevent or treat constipation while you are taking prescription pain medicine, your health care provider may recommend that you: ? Drink enough fluid to keep your urine clear or pale yellow. ? Take over-the-counter or prescription medicines. ? Eat foods that are high in fiber, such as fresh fruits and vegetables, whole grains, and beans. ? Limit foods that are high in fat and processed sugars, such as fried and sweet foods. Lifestyle  Avoid alcohol use.  Do not use any products that contain nicotine or tobacco, such as cigarettes and e-cigarettes. If you need help quitting, ask your health care provider.  Get at least 8 hours of sleep every night.  Limit your stress. General instructions   Keep a journal to find out what may trigger your migraine headaches. For example, write down: ? What you eat and   drink. ? How much sleep you get. ? Any change to your diet or medicines.  If you have a migraine: ? Avoid things that make your symptoms worse, such as bright lights. ? It may help to lie down in a dark, quiet room. ? Do not drive or use heavy machinery. ? Ask your health care provider  what activities are safe for you while you are experiencing symptoms.  Keep all follow-up visits as told by your health care provider. This is important. Contact a health care provider if:  You develop symptoms that are different or more severe than your usual migraine symptoms. Get help right away if:  Your migraine becomes severe.  You have a fever.  You have a stiff neck.  You have vision loss.  Your muscles feel weak or like you cannot control them.  You start to lose your balance often.  You develop trouble walking.  You faint. This information is not intended to replace advice given to you by your health care provider. Make sure you discuss any questions you have with your health care provider. Document Released: 12/30/2004 Document Revised: 07/20/2015 Document Reviewed: 06/18/2015 Elsevier Interactive Patient Education  2017 [employer]   

## 2017-12-04 NOTE — Pre-Procedure Instructions (Signed)
Terri Waters  12/04/2017      [id number] - [place], [place] - [address] AT [place] OF [address] [place] [address] Phone: [phone] Fax: [phone]    Your procedure is scheduled on Fri., Dec. 6, 2019 from 1:05PM-2:50PM  Report to [facility] Admitting Entrance "A" at 11:05AM  Call this number if you have problems the morning of surgery:  [phone]   Remember:  Do not eat or drink after midnight on Dec. 5th    Take these medicines the morning of surgery with A SIP OF WATER: Levothyroxine (SYNTHROID, LEVOTHROID), Metoprolol succinate (TOPROL-XL), and Pantoprazole (PROTONIX)   Stop taking Aspirin 5 days (12/13/17) prior to surgery.  7 days before surgery (12/10/17), stop taking all Other Aspirin Products, Vitamins, Fish oils, and Herbal medications. Also stop all NSAIDS i.e. Advil, Ibuprofen, Motrin, Aleve, Anaprox, Naproxen, BC, Goody Powders, and all Supplements.  WHAT DO I DO ABOUT MY DIABETES MEDICATION?  Marland Kitchen Do not take the evening dose of Glimepiride (AMARYL) the night before surgery.  . Do not take Glimepiride (AMARYL) and MetFORMIN (GLUCOPHAGE-XR) the morning of surgery  How to Manage Your Diabetes Before and After Surgery  Why is it important to control my blood sugar before and after surgery? . Improving blood sugar levels before and after surgery helps healing and can limit problems. . A way of improving blood sugar control is eating a healthy diet by: o  Eating less sugar and carbohydrates o  Increasing activity/exercise o  Talking with your doctor about reaching your blood sugar goals . High blood sugars (greater than 180 mg/dL) can raise your risk of infections and slow your recovery, so you will need to focus on controlling your diabetes during the weeks before surgery. . Make sure that the doctor who takes care of your diabetes knows about your planned surgery including the date  and location.  How do I manage my blood sugar before surgery? . Check your blood sugar at least 4 times a day, starting 2 days before surgery, to make sure that the level is not too high or low. o Check your blood sugar the morning of your surgery when you wake up and every 2 hours until you get to the Short Stay unit. . If your blood sugar is less than 70 mg/dL, you will need to treat for low blood sugar: o Do not take insulin. o Treat a low blood sugar (less than 70 mg/dL) with  cup of clear juice (cranberry or apple), 4 glucose tablets, OR glucose gel. Recheck blood sugar in 15 minutes after treatment (to make sure it is greater than 70 mg/dL). If your blood sugar is not greater than 70 mg/dL on recheck, call [phone] o  for further instructions. .  If your CBG is greater than 220 mg/dL, call the number above for further instructions  . If you are admitted to the hospital after surgery: o Your blood sugar will be checked by the staff and you will probably be given insulin after surgery (instead of oral diabetes medicines) to make sure you have good blood sugar levels. o The goal for blood sugar control after surgery is 80-180 mg/dL.  Reviewed and Endorsed by [facility] Patient Education Committee, August 2015    Do not wear jewelry, make-up or nail polish.  Do not wear lotions, powders, or perfumes, or deodorant.  Do not shave 48 hours prior  to surgery.   Do not bring valuables to the hospital.  [facility] is not responsible for any belongings or valuables.  Contacts, dentures or bridgework may not be worn into surgery.  Leave your suitcase in the car.  After surgery it may be brought to your room.  For patients admitted to the hospital, discharge time will be determined by your treatment team.  Patients discharged the day of surgery will not be allowed to drive home.   Special instructions:   [facility]- Preparing For Surgery  Before surgery, you can play an important  role. Because skin is not sterile, your skin needs to be as free of germs as possible. You can reduce the number of germs on your skin by washing with CHG (chlorahexidine gluconate) Soap before surgery.  CHG is an antiseptic cleaner which kills germs and bonds with the skin to continue killing germs even after washing.    Oral Hygiene is also important to reduce your risk of infection.  Remember - BRUSH YOUR TEETH THE MORNING OF SURGERY WITH YOUR REGULAR TOOTHPASTE  Please do not use if you have an allergy to CHG or antibacterial soaps. If your skin becomes reddened/irritated stop using the CHG.  Do not shave (including legs and underarms) for at least 48 hours prior to first CHG shower. It is OK to shave your face.  Please follow these instructions carefully.   1. Shower the NIGHT BEFORE SURGERY and the MORNING OF SURGERY with CHG.   2. If you chose to wash your hair, wash your hair first as usual with your normal shampoo.  3. After you shampoo, rinse your hair and body thoroughly to remove the shampoo.  4. Use CHG as you would any other liquid soap. You can apply CHG directly to the skin and wash gently with a scrungie or a clean washcloth.   5. Apply the CHG Soap to your body ONLY FROM THE NECK DOWN.  Do not use on open wounds or open sores. Avoid contact with your eyes, ears, mouth and genitals (private parts). Wash Face and genitals (private parts)  with your normal soap.  6. Wash thoroughly, paying special attention to the area where your surgery will be performed.  7. Thoroughly rinse your body with warm water from the neck down.  8. DO NOT shower/wash with your normal soap after using and rinsing off the CHG Soap.  9. Pat yourself dry with a CLEAN TOWEL.  10. Wear CLEAN PAJAMAS to bed the night before surgery, wear comfortable clothes the morning of surgery  11. Place CLEAN SHEETS on your bed the night of your first shower and DO NOT SLEEP WITH PETS.  Day of Surgery:  Do not  apply any deodorants/lotions.  Please wear clean clothes to the hospital/surgery center.   Remember to brush your teeth WITH YOUR REGULAR TOOTHPASTE.  Please read over the following fact sheets that you were given. Pain Booklet, Coughing and Deep Breathing, MRSA Information and Surgical Site Infection Prevention

## 2017-12-07 ENCOUNTER — Encounter (HOSPITAL_COMMUNITY)
Admission: RE | Admit: 2017-12-07 | Discharge: 2017-12-07 | Disposition: A | Discharge status: Home / Self Care | Payer: 59 | Source: Ambulatory Visit | Attending: Orthopedic Surgery | Admitting: Orthopedic Surgery

## 2017-12-07 ENCOUNTER — Other Ambulatory Visit: Payer: Self-pay

## 2017-12-07 ENCOUNTER — Encounter (HOSPITAL_COMMUNITY): Payer: Self-pay

## 2017-12-07 ENCOUNTER — Other Ambulatory Visit (HOSPITAL_COMMUNITY): Payer: 59

## 2017-12-07 DIAGNOSIS — Z01812 Encounter for preprocedural laboratory examination: Secondary | ICD-10-CM | POA: Diagnosis not present

## 2017-12-07 HISTORY — DX: Ventricular tachycardia, unspecified: I47.20

## 2017-12-07 HISTORY — DX: Ventricular tachycardia: I47.2

## 2017-12-07 HISTORY — DX: Hypothyroidism, unspecified: E03.9

## 2017-12-07 LAB — BASIC METABOLIC PANEL
Anion gap: 7 (ref 5–15)
BUN: 12 mg/dL (ref 8–23)
CALCIUM: 9.2 mg/dL (ref 8.9–10.3)
CO2: 28 mmol/L (ref 22–32)
CREATININE: 1.12 mg/dL — AB (ref 0.44–1.00)
Chloride: 100 mmol/L (ref 98–111)
GFR calc Af Amer: >60 mL/min (ref >60)
GFR, EST NON AFRICAN AMERICAN: 52 mL/min — AB (ref >60)
GLUCOSE: 138 mg/dL — AB (ref 70–99)
POTASSIUM: 4.2 mmol/L (ref 3.5–5.1)
Sodium: 135 mmol/L (ref 135–145)

## 2017-12-07 LAB — CBC
HCT: 38.9 % (ref 36.0–46.0)
HEMOGLOBIN: 12 g/dL (ref 12.0–15.0)
MCH: 27.3 pg (ref 26.0–34.0)
MCHC: 30.8 g/dL (ref 30.0–36.0)
MCV: 88.6 fL (ref 80.0–100.0)
Platelets: 381 10*3/uL (ref 150–400)
RBC: 4.39 MIL/uL (ref 3.87–5.11)
RDW: 13.9 % (ref 11.5–15.5)
WBC: 7.7 10*3/uL (ref 4.0–10.5)
nRBC: 0 % (ref 0.0–0.2)

## 2017-12-07 LAB — GLUCOSE, CAPILLARY: GLUCOSE-CAPILLARY: 118 mg/dL — AB (ref 70–99)

## 2017-12-07 LAB — SURGICAL PCR SCREEN
MRSA, PCR: NEGATIVE
STAPHYLOCOCCUS AUREUS: NEGATIVE

## 2017-12-07 NOTE — Progress Notes (Signed)
PCP: [name]  Cardiologist: Hunt Oris / [place], [place]  DM: Type 2 Fasting sugars 110-120  SA: yes, but does not wear CPAP due to recent weight loss, no longer requires CPAP according to physician  Cardiac Clearance in chart.  No orders at time of PAT appointment.  Messaged Dr. Veverly Fells asking for orders.  Pt denies SOB, chest pain, fever.  Does have residual cough from being sick a few weeks ago.  Pt stated understanding of instructions given for DOS.

## 2017-12-08 NOTE — Progress Notes (Signed)
Anesthesia Chart Review:  Case:  [id number] Date/Time:  12/18/17 1145   Procedure:  TOTAL KNEE ARTHROPLASTY (Right )   Anesthesia type:  Choice   Pre-op diagnosis:  Right knee end stage osteoarthritis   Location:  [facility] OR ROOM 05 / [place] OR   Surgeon:  Netta Cedars, MD      DISCUSSION: 62 yo female never smoker. Pertinent hx includes DMII, HTN, HLD, Atrial tachycardia, NSVT, Hypothyroid, Migraines, Hypomagnesemia.  Pt follows with cardiology at [facility], NP. She was seen 11/03/2017 for preop clearance. Per her note in care everywhere the pt was found to have episodes of NSVT on recent Ziop[place]. She had a normal stress echo 06/2017 but poor exercise tolerance. She said pt would need nuclear stress prior to surgery, and if normal she would be cleared.  Nuclear stress performed 11/10/17 showed no evidence of ischemia, Normal LV function with EF of 82 %  Pt was also seen by her PCP 11/19/2017 for preop clearance. Per note in care everywhere "Assuming labs are normal patient is low risk for intermediate risk procedure. She had a negative stress test this year. Able to complete 4 METs of exercise. Okay to hold aspirin 5 days prior to surgery".   CBC and BMP in care everywhere 11/19/2017 reviewed showed mildly elevated creatinine 1.31, otherwise WNL, A1c 7.0. She did have a UTI that was treated with Bactrim.  Anticipate she can proceed as planned barring acute status change.   VS: BP (!) 147/55   Pulse 70   Temp 36.8 C   Resp 20   Ht 5\' 3"  (1.6 m)   Wt 103.2 kg   SpO2 100%   BMI 40.32 kg/m   PROVIDERS: [name], MD is PCP  Talbert Cage, NP is Cardiology provider  LABS: Labs reviewed: Acceptable for surgery. (all labs ordered are listed, but only abnormal results are displayed)  Labs Reviewed  GLUCOSE, CAPILLARY - Abnormal; Notable for the following components:      Result Value   Glucose-Capillary 118 (*)    All other components within normal limits  BASIC  METABOLIC PANEL - Abnormal; Notable for the following components:   Glucose, Bld 138 (*)    Creatinine, Ser 1.12 (*)    GFR calc non Af Amer 52 (*)    All other components within normal limits  SURGICAL PCR SCREEN  CBC     IMAGES: CHEST  2 VIEW 03/08/2017:  COMPARISON:  01/09/2016  FINDINGS: Mild enlargement of the cardiopericardial silhouette, without edema. Mild thoracic spondylosis. The lungs appear clear. No pleural effusion. Spurring of the right humeral head.  IMPRESSION: 1. Mild enlargement of the cardiopericardial silhouette, without edema.  EKG: 03/09/2017: Normal sinus rhythm. Rate 87.   CV: Lexiscan stress 11/10/2017 (care everywhere): Summary  Well tolerated Lexiscan perfusion stress test, with no clinical ischemia  during vasodilator stress.  There is normal isotope uptake following Lexiscan injection and at rest.  There is no evidence of ischemia.  Normal LV function with EF of 82 %.  Signatures  Stress echo 07/03/2017 (care everywhere): Stress Interpretation  Normal resting left ventricular function, with no resting segmental  abnormality.  Normal hypercontractile response throughout, with no induced wall motion  abnormality.  Appropriate hemodynamic response to exercise. No significant ST T wave  changes with exercise. EKG portion is negative for ischemia by diagnostic  criteria.  Results  Global LVEF (rest): Normal (LVEF >50%)  Global LVEF (stress): Hyperkinetic (LVEF >70%)  ECG  No ST-T wave  changes.  Arrhythmias  Ventricular premature beats.  Symptoms  Shortness of breath  Symptoms resolved with rest.  Past Medical History:  Diagnosis Date  . Cancer (HCC)    papillary carcinoma  . Diabetes mellitus without complication ([place])   . High cholesterol   . Hypertension   . Hypothyroidism   . Migraines   . Thyroid disease   . Ventricular tachycardia [facility])     Past Surgical History:  Procedure Laterality Date  . ABDOMINAL HYSTERECTOMY     . BREAST SURGERY     lumps removed - over 20 years ago  . THYROIDECTOMY      MEDICATIONS: . aspirin 81 MG tablet  . atorvastatin (LIPITOR) 40 MG tablet  . Cholecalciferol (VITAMIN D3) 1.25 MG (50000 UT) CAPS  . glimepiride (AMARYL) 2 MG tablet  . hydrochlorothiazide (HYDRODIURIL) 25 MG tablet  . levothyroxine (SYNTHROID, LEVOTHROID) 100 MCG tablet  . metFORMIN (GLUCOPHAGE-XR) 500 MG 24 hr tablet  . metoprolol succinate (TOPROL-XL) 50 MG 24 hr tablet  . niacin (NIASPAN) 500 MG CR tablet  . olmesartan (BENICAR) 20 MG tablet  . pantoprazole (PROTONIX) 40 MG tablet   No current facility-administered medications for this encounter.      Wynonia Musty [facility] Short Stay Center/Anesthesiology Phone [phone] 12/08/2017 2:09 PM

## 2017-12-08 NOTE — Anesthesia Preprocedure Evaluation (Addendum)
Anesthesia Evaluation  Patient identified by MRN, date of birth, ID band Patient awake    Reviewed: Allergy & Precautions, NPO status , Patient's Chart, lab work & pertinent test results, reviewed documented beta blocker date and time   Airway Mallampati: II       Dental no notable dental hx. (+) Teeth Intact   Pulmonary neg pulmonary ROS,    Pulmonary exam normal breath sounds clear to auscultation       Cardiovascular hypertension, Pt. on medications and Pt. on home beta blockers Normal cardiovascular exam Rhythm:Regular Rate:Normal     Neuro/Psych    GI/Hepatic GERD  Medicated,  Endo/Other  diabetes, Oral Hypoglycemic AgentsHypothyroidism Morbid obesity  Renal/GU   negative genitourinary   Musculoskeletal  (+) Arthritis , Osteoarthritis,    Abdominal (+) + obese,   Peds  Hematology negative hematology ROS (+)   Anesthesia Other Findings   Reproductive/Obstetrics                            Anesthesia Physical Anesthesia Plan  ASA: III  Anesthesia Plan: Spinal   Post-op Pain Management:  Regional for Post-op pain   Induction:   PONV Risk Score and Plan: 2 and Ondansetron  Airway Management Planned: Natural Airway and Nasal Cannula  Additional Equipment:   Intra-op Plan:   Post-operative Plan:   Informed Consent: I have reviewed the patients History and Physical, chart, labs and discussed the procedure including the risks, benefits and alternatives for the proposed anesthesia with the patient or authorized representative who has indicated his/her understanding and acceptance.     Plan Discussed with: CRNA  Anesthesia Plan Comments: (See PAT note 12/07/2017 by Karoline Caldwell, PA-C )       Anesthesia Quick Evaluation

## 2017-12-09 NOTE — H&P (Signed)
Patient's anticipated LOS is less than 2 midnights, meeting these requirements: - Younger than 11 - Lives within 1 hour of care - Has a competent adult at home to recover with post-op recover - NO history of  - Chronic pain requiring opiods  - Diabetes  - Coronary Artery Disease  - Heart failure  - Heart attack  - Stroke  - DVT/VTE  - Cardiac arrhythmia  - Respiratory Failure/COPD  - Renal failure  - Anemia  - Advanced Liver disease       Terri Waters is an 62 y.o. female.    Chief Complaint: right knee pain  HPI: Pt is a 62 y.o. female complaining of right knee pain for multiple years. Pain had continually increased since the beginning. X-rays in the clinic show end-stage arthritic changes of the right knee. Pt has tried various conservative treatments which have failed to alleviate their symptoms, including injections and therapy. Various options are discussed with the patient. Risks, benefits and expectations were discussed with the patient. Patient understand the risks, benefits and expectations and wishes to proceed with surgery.   PCP:  [name], MD  D/C Plans: Home  PMH: Past Medical History:  Diagnosis Date  . Cancer (HCC)    papillary carcinoma  . Diabetes mellitus without complication ([place])   . High cholesterol   . Hypertension   . Hypothyroidism   . Migraines   . Thyroid disease   . Ventricular tachycardia (HCC)     PSH: Past Surgical History:  Procedure Laterality Date  . ABDOMINAL HYSTERECTOMY    . BREAST SURGERY     lumps removed - over 20 years ago  . THYROIDECTOMY      Social History:  reports that she has never smoked. She has never used smokeless tobacco. She reports that she does not drink alcohol or use drugs.  Allergies:  Allergies  Allergen Reactions  . Tape Rash    Medications: No current facility-administered medications for this encounter.    Current Outpatient Medications  Medication Sig Dispense Refill  . aspirin  81 MG tablet Take 81 mg by mouth daily.    Marland Kitchen atorvastatin (LIPITOR) 40 MG tablet Take 40 mg by mouth daily.    . Cholecalciferol (VITAMIN D3) 1.25 MG (50000 UT) CAPS Take 50,000 Units by mouth every Sunday.    Marland Kitchen glimepiride (AMARYL) 2 MG tablet Take 2 mg by mouth 2 (two) times daily.     . hydrochlorothiazide (HYDRODIURIL) 25 MG tablet Take 25 mg by mouth daily.    Marland Kitchen levothyroxine (SYNTHROID, LEVOTHROID) 100 MCG tablet Take 100 mcg by mouth daily before breakfast.     . metFORMIN (GLUCOPHAGE-XR) 500 MG 24 hr tablet Take 1,000 mg by mouth 2 (two) times daily.    . metoprolol succinate (TOPROL-XL) 50 MG 24 hr tablet Take 50 mg by mouth daily. Take with or immediately following a meal.    . niacin (NIASPAN) 500 MG CR tablet Take 500 mg by mouth daily.    Marland Kitchen olmesartan (BENICAR) 20 MG tablet Take 20 mg by mouth 2 (two) times daily.     . pantoprazole (PROTONIX) 40 MG tablet Take 40 mg by mouth 2 (two) times daily before a meal.      Results for orders placed or performed during the hospital encounter of 12/07/17 (from the past 48 hour(s))  Glucose, capillary     Status: Abnormal   Collection Time: 12/07/17  8:08 AM  Result Value Ref Range   Glucose-Capillary  118 (H) 70 - 99 mg/dL  Surgical pcr screen     Status: None   Collection Time: 12/07/17  8:41 AM  Result Value Ref Range   MRSA, PCR NEGATIVE NEGATIVE   Staphylococcus aureus NEGATIVE NEGATIVE    Comment: (NOTE) The Xpert SA Assay (FDA approved for NASAL specimens in patients 16 years of age and older), is one component of a comprehensive surveillance program. It is not intended to diagnose infection nor to guide or monitor treatment. Performed at [facility], [place] [address], [place], [place] [address]   Basic metabolic panel     Status: Abnormal   Collection Time: 12/07/17  8:41 AM  Result Value Ref Range   Sodium 135 135 - 145 mmol/L   Potassium 4.2 3.5 - 5.1 mmol/L   Chloride 100 98 - 111 mmol/L   CO2 28 22 - 32 mmol/L     Glucose, Bld 138 (H) 70 - 99 mg/dL   BUN 12 8 - 23 mg/dL   Creatinine, Ser 1.12 (H) 0.44 - 1.00 mg/dL   Calcium 9.2 8.9 - 10.3 mg/dL   GFR calc non Af Amer 52 (L) >60 mL/min   GFR calc Af Amer >60 >60 mL/min    Comment: (NOTE) The eGFR has been calculated using the CKD EPI equation. This calculation has not been validated in all clinical situations. eGFR's persistently <60 mL/min signify possible Chronic Kidney Disease.    Anion gap 7 5 - 15    Comment: Performed at [facility] [address], [place] [address]  CBC     Status: None   Collection Time: 12/07/17  8:41 AM  Result Value Ref Range   WBC 7.7 4.0 - 10.5 K/uL   RBC 4.39 3.87 - 5.11 MIL/uL   Hemoglobin 12.0 12.0 - 15.0 g/dL   HCT 38.9 36.0 - 46.0 %   MCV 88.6 80.0 - 100.0 fL   MCH 27.3 26.0 - 34.0 pg   MCHC 30.8 30.0 - 36.0 g/dL   RDW 13.9 11.5 - 15.5 %   Platelets 381 150 - 400 K/uL   nRBC 0.0 0.0 - 0.2 %    Comment: Performed at [facility], [place] [address] [place], [place] [address]   No results found.  ROS: Pain with rom of the right lower extremity  Physical Exam: Alert and oriented 62 y.o. female in no acute distress Cranial nerves 2-12 intact Cervical spine: full rom with no tenderness, nv intact distally Chest: active breath sounds bilaterally, no wheeze rhonchi or rales Heart: regular rate and rhythm, no murmur Abd: non tender non distended with active bowel sounds Hip is stable with rom  Right knee moderate medial and lateral joint line tenderness nv intact distally No rashes or edema Antalgic gait  Assessment/Plan Assessment: right knee end stage osteoarthritis  Plan:  Patient will undergo a right total knee by Dr. Veverly Fells at [facility]. Risks benefits and expectations were discussed with the patient. Patient understand risks, benefits and expectations and wishes to proceed. Preoperative templating of the joint replacement has been completed, documented, and  submitted to the Operating Room personnel in order to optimize intra-operative equipment management.   Merla Riches PA-C, MPAS [facility] is now [employer] [facility] [address], [place], [place],  [address] Phone: [phone] [email or web address] Facebook  [employer]

## 2017-12-17 MED ORDER — TRANEXAMIC ACID-NACL 1000-0.7 MG/100ML-% IV SOLN
1000.0000 mg | INTRAVENOUS | Status: AC
  Administered 2017-12-18: 1000 mg via INTRAVENOUS
  Filled 2017-12-17: qty 100

## 2017-12-18 ENCOUNTER — Inpatient Hospital Stay (HOSPITAL_COMMUNITY)
Admission: RE | Admit: 2017-12-18 | Discharge: 2017-12-20 | DRG: 470 | Disposition: A | Discharge status: Home Health | Payer: 59 | Attending: Orthopedic Surgery | Admitting: Orthopedic Surgery

## 2017-12-18 ENCOUNTER — Inpatient Hospital Stay (HOSPITAL_COMMUNITY): Payer: 59 | Admitting: Physician Assistant

## 2017-12-18 ENCOUNTER — Encounter (HOSPITAL_COMMUNITY)
Admission: RE | Disposition: A | Discharge status: Home Health | Payer: Self-pay | Source: Home / Self Care | Attending: Orthopedic Surgery

## 2017-12-18 ENCOUNTER — Inpatient Hospital Stay (HOSPITAL_COMMUNITY): Payer: 59

## 2017-12-18 ENCOUNTER — Other Ambulatory Visit: Payer: Self-pay

## 2017-12-18 ENCOUNTER — Inpatient Hospital Stay (HOSPITAL_COMMUNITY): Payer: 59 | Admitting: Anesthesiology

## 2017-12-18 ENCOUNTER — Encounter (HOSPITAL_COMMUNITY): Payer: Self-pay | Admitting: *Deleted

## 2017-12-18 DIAGNOSIS — M1711 Unilateral primary osteoarthritis, right knee: Principal | ICD-10-CM | POA: Diagnosis present

## 2017-12-18 DIAGNOSIS — Z7984 Long term (current) use of oral hypoglycemic drugs: Secondary | ICD-10-CM | POA: Diagnosis not present

## 2017-12-18 DIAGNOSIS — K219 Gastro-esophageal reflux disease without esophagitis: Secondary | ICD-10-CM | POA: Diagnosis present

## 2017-12-18 DIAGNOSIS — Z79890 Hormone replacement therapy (postmenopausal): Secondary | ICD-10-CM | POA: Diagnosis not present

## 2017-12-18 DIAGNOSIS — E7800 Pure hypercholesterolemia, unspecified: Secondary | ICD-10-CM | POA: Diagnosis present

## 2017-12-18 DIAGNOSIS — R112 Nausea with vomiting, unspecified: Secondary | ICD-10-CM | POA: Diagnosis not present

## 2017-12-18 DIAGNOSIS — I10 Essential (primary) hypertension: Secondary | ICD-10-CM | POA: Diagnosis present

## 2017-12-18 DIAGNOSIS — Z7982 Long term (current) use of aspirin: Secondary | ICD-10-CM

## 2017-12-18 DIAGNOSIS — Z90710 Acquired absence of both cervix and uterus: Secondary | ICD-10-CM

## 2017-12-18 DIAGNOSIS — Z6841 Body Mass Index (BMI) 40.0 and over, adult: Secondary | ICD-10-CM

## 2017-12-18 DIAGNOSIS — Z859 Personal history of malignant neoplasm, unspecified: Secondary | ICD-10-CM | POA: Diagnosis not present

## 2017-12-18 DIAGNOSIS — Z79899 Other long term (current) drug therapy: Secondary | ICD-10-CM

## 2017-12-18 DIAGNOSIS — E119 Type 2 diabetes mellitus without complications: Secondary | ICD-10-CM | POA: Diagnosis present

## 2017-12-18 DIAGNOSIS — M25561 Pain in right knee: Secondary | ICD-10-CM | POA: Diagnosis present

## 2017-12-18 DIAGNOSIS — Z96651 Presence of right artificial knee joint: Secondary | ICD-10-CM

## 2017-12-18 DIAGNOSIS — E890 Postprocedural hypothyroidism: Secondary | ICD-10-CM | POA: Diagnosis present

## 2017-12-18 HISTORY — PX: TOTAL KNEE ARTHROPLASTY: SHX125

## 2017-12-18 LAB — GLUCOSE, CAPILLARY
GLUCOSE-CAPILLARY: 102 mg/dL — AB (ref 70–99)
Glucose-Capillary: 101 mg/dL — ABNORMAL HIGH (ref 70–99)
Glucose-Capillary: 85 mg/dL (ref 70–99)
Glucose-Capillary: 190 mg/dL — ABNORMAL HIGH (ref 70–99)

## 2017-12-18 IMAGING — DX DG KNEE 1-2V PORT*R*
4 series · 4 of 4 positions shown · non-contrast
Comparison: [DATE]

CLINICAL DATA: Status post total right knee replacement.

EXAM:
PORTABLE RIGHT KNEE - 1-2 VIEW

[knee ap]
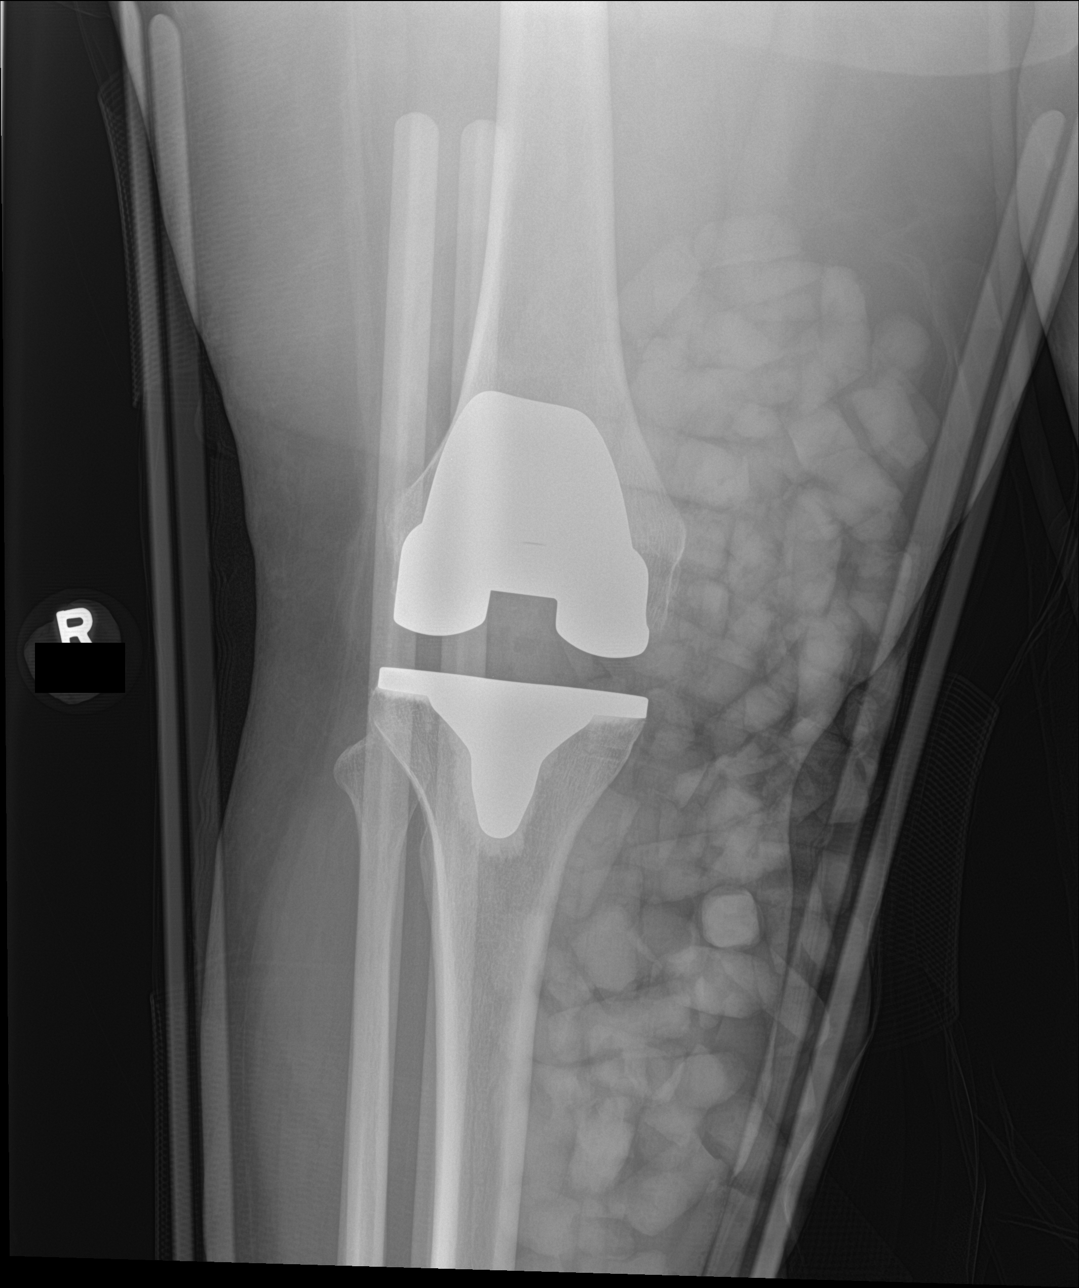

[knee lat]
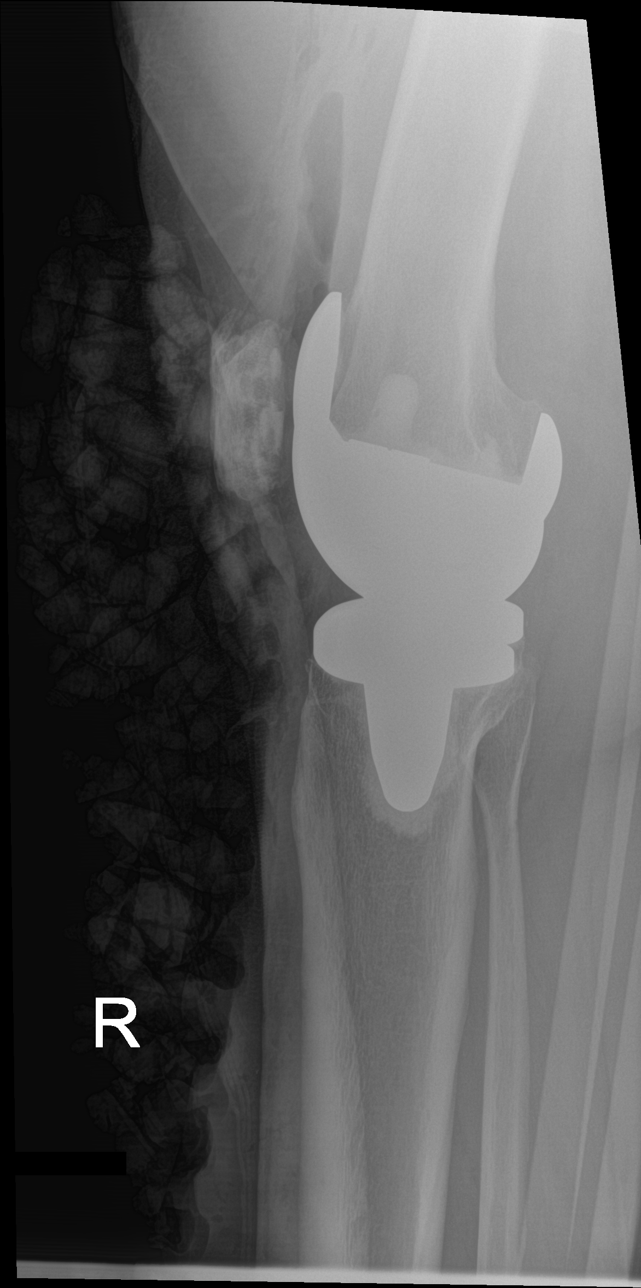

[knee obl (1 of 2)]
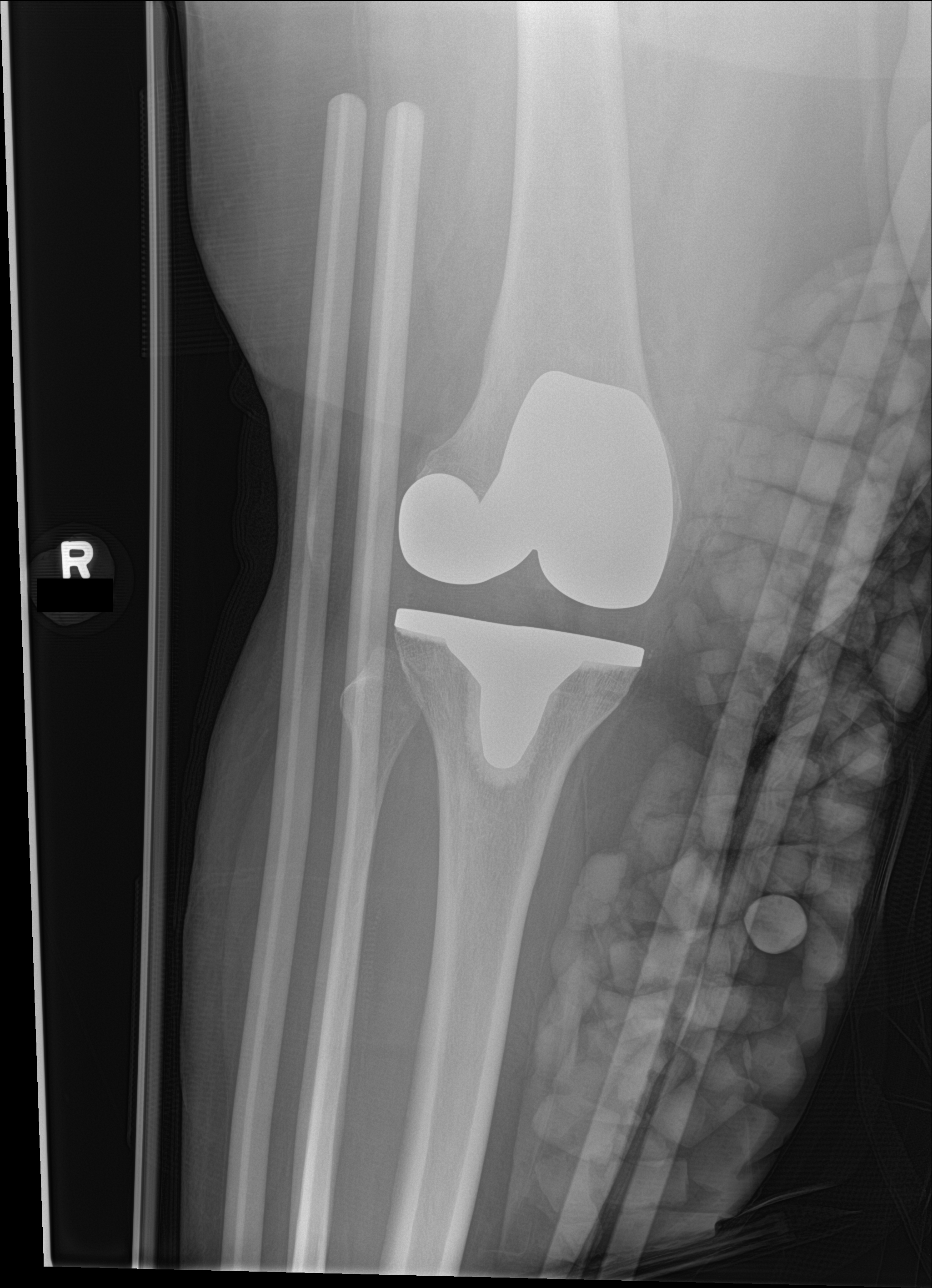

[knee obl (2 of 2)]
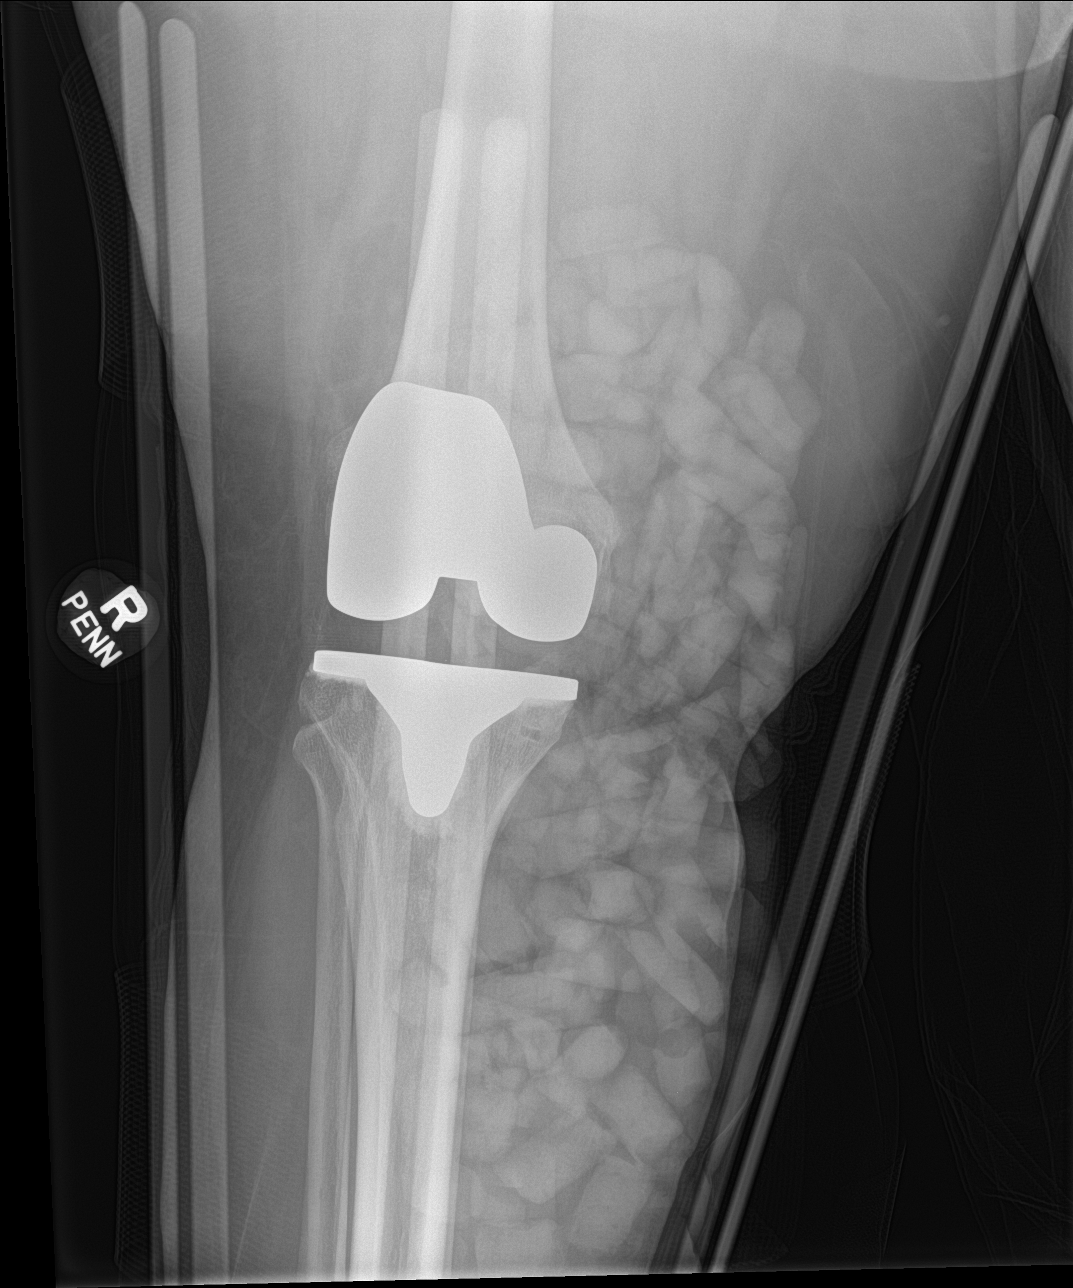

[4 of 4 positions shown; findings below may reference images not displayed]

FINDINGS: Post total right knee arthroplasty with normal alignment of the
orthopedic hardware. No evidence of fracture. Expected postsurgical
soft tissue edema and emphysema.
IMPRESSION: Post recent total right knee arthroplasty without evidence of
immediate complications.

## 2017-12-18 SURGERY — ARTHROPLASTY, KNEE, TOTAL
Anesthesia: Spinal | Site: Knee | Laterality: Right

## 2017-12-18 MED ORDER — OXYCODONE HCL 5 MG PO TABS
5.0000 mg | ORAL_TABLET | ORAL | Status: DC | PRN
  Administered 2017-12-18 – 2017-12-19 (×2): 10 mg via ORAL
  Administered 2017-12-19: 5 mg via ORAL
  Administered 2017-12-19 – 2017-12-20 (×3): 10 mg via ORAL
  Filled 2017-12-18 (×6): qty 2

## 2017-12-18 MED ORDER — FENTANYL CITRATE (PF) 100 MCG/2ML IJ SOLN
INTRAMUSCULAR | Status: AC
  Administered 2017-12-18: 100 ug via INTRAVENOUS
  Filled 2017-12-18: qty 2

## 2017-12-18 MED ORDER — SODIUM CHLORIDE 0.9 % IV SOLN
INTRAVENOUS | Status: DC
  Administered 2017-12-18: 18:00:00 via INTRAVENOUS

## 2017-12-18 MED ORDER — MIDAZOLAM HCL 2 MG/2ML IJ SOLN
INTRAMUSCULAR | Status: AC
  Administered 2017-12-18: 2 mg via INTRAVENOUS
  Filled 2017-12-18: qty 2

## 2017-12-18 MED ORDER — INSULIN ASPART 100 UNIT/ML ~~LOC~~ SOLN: 6.0000 [IU] | Freq: Three times a day (TID) | SUBCUTANEOUS | Status: DC

## 2017-12-18 MED ORDER — METHOCARBAMOL 500 MG PO TABS: 500.0000 mg | ORAL_TABLET | Freq: Three times a day (TID) | ORAL | 1 refills | Status: DC | PRN

## 2017-12-18 MED ORDER — BUPIVACAINE IN DEXTROSE 0.75-8.25 % IT SOLN
INTRATHECAL | Status: DC | PRN
  Administered 2017-12-18: 1.8 mL via INTRATHECAL

## 2017-12-18 MED ORDER — INSULIN ASPART 100 UNIT/ML ~~LOC~~ SOLN
0.0000 [IU] | Freq: Three times a day (TID) | SUBCUTANEOUS | Status: DC
  Administered 2017-12-19: 4 [IU] via SUBCUTANEOUS

## 2017-12-18 MED ORDER — METHOCARBAMOL 1000 MG/10ML IJ SOLN
500.0000 mg | Freq: Four times a day (QID) | INTRAVENOUS | Status: DC | PRN
  Filled 2017-12-18: qty 5

## 2017-12-18 MED ORDER — METFORMIN HCL ER 500 MG PO TB24
1000.0000 mg | ORAL_TABLET | Freq: Two times a day (BID) | ORAL | Status: DC
  Administered 2017-12-18 – 2017-12-20 (×4): 1000 mg via ORAL
  Filled 2017-12-18 (×5): qty 2

## 2017-12-18 MED ORDER — ONDANSETRON HCL 4 MG PO TABS: 4.0000 mg | ORAL_TABLET | Freq: Four times a day (QID) | ORAL | Status: DC | PRN

## 2017-12-18 MED ORDER — BISACODYL 10 MG RE SUPP: 10.0000 mg | Freq: Every day | RECTAL | Status: DC | PRN

## 2017-12-18 MED ORDER — PANTOPRAZOLE SODIUM 40 MG PO TBEC
40.0000 mg | DELAYED_RELEASE_TABLET | Freq: Two times a day (BID) | ORAL | Status: DC
  Administered 2017-12-18 – 2017-12-20 (×4): 40 mg via ORAL
  Filled 2017-12-18 (×4): qty 1

## 2017-12-18 MED ORDER — HYDROMORPHONE HCL 1 MG/ML IJ SOLN
0.5000 mg | INTRAMUSCULAR | Status: DC | PRN
  Administered 2017-12-18: 0.5 mg via INTRAVENOUS
  Filled 2017-12-18: qty 1

## 2017-12-18 MED ORDER — MEPERIDINE HCL 50 MG/ML IJ SOLN: 6.2500 mg | INTRAMUSCULAR | Status: DC | PRN

## 2017-12-18 MED ORDER — PROPOFOL 10 MG/ML IV BOLUS
INTRAVENOUS | Status: DC | PRN
  Administered 2017-12-18: 30 mg via INTRAVENOUS
  Administered 2017-12-18 (×2): 40 mg via INTRAVENOUS
  Administered 2017-12-18: 30 mg via INTRAVENOUS

## 2017-12-18 MED ORDER — ATORVASTATIN CALCIUM 40 MG PO TABS
40.0000 mg | ORAL_TABLET | Freq: Every day | ORAL | Status: DC
  Administered 2017-12-18 – 2017-12-19 (×2): 40 mg via ORAL
  Filled 2017-12-18 (×2): qty 1

## 2017-12-18 MED ORDER — DOCUSATE SODIUM 100 MG PO CAPS
100.0000 mg | ORAL_CAPSULE | Freq: Two times a day (BID) | ORAL | Status: DC
  Administered 2017-12-18 – 2017-12-20 (×4): 100 mg via ORAL
  Filled 2017-12-18 (×4): qty 1

## 2017-12-18 MED ORDER — HYDROMORPHONE HCL 1 MG/ML IJ SOLN
0.2500 mg | INTRAMUSCULAR | Status: DC | PRN
  Administered 2017-12-18 (×2): 0.5 mg via INTRAVENOUS

## 2017-12-18 MED ORDER — ONDANSETRON HCL 4 MG/2ML IJ SOLN
INTRAMUSCULAR | Status: DC | PRN
  Administered 2017-12-18: 4 mg via INTRAVENOUS

## 2017-12-18 MED ORDER — INFLUENZA VAC SPLIT QUAD 0.5 ML IM SUSY: 0.5000 mL | PREFILLED_SYRINGE | INTRAMUSCULAR | Status: DC

## 2017-12-18 MED ORDER — FENTANYL CITRATE (PF) 250 MCG/5ML IJ SOLN
INTRAMUSCULAR | Status: DC | PRN
  Administered 2017-12-18: 50 ug via INTRAVENOUS

## 2017-12-18 MED ORDER — HYDROCHLOROTHIAZIDE 25 MG PO TABS
25.0000 mg | ORAL_TABLET | Freq: Every day | ORAL | Status: DC
  Administered 2017-12-18 – 2017-12-20 (×3): 25 mg via ORAL
  Filled 2017-12-18 (×3): qty 1

## 2017-12-18 MED ORDER — PROPOFOL 500 MG/50ML IV EMUL
INTRAVENOUS | Status: DC | PRN
  Administered 2017-12-18: 100 ug/kg/min via INTRAVENOUS

## 2017-12-18 MED ORDER — MENTHOL 3 MG MT LOZG: 1.0000 | LOZENGE | OROMUCOSAL | Status: DC | PRN

## 2017-12-18 MED ORDER — ASPIRIN 81 MG PO TABS: 81.0000 mg | ORAL_TABLET | Freq: Two times a day (BID) | ORAL | 0 refills | Status: DC

## 2017-12-18 MED ORDER — METOPROLOL SUCCINATE ER 50 MG PO TB24
50.0000 mg | ORAL_TABLET | Freq: Every day | ORAL | Status: DC
  Administered 2017-12-19 – 2017-12-20 (×2): 50 mg via ORAL
  Filled 2017-12-18 (×2): qty 1

## 2017-12-18 MED ORDER — METOCLOPRAMIDE HCL 5 MG/ML IJ SOLN: 5.0000 mg | Freq: Three times a day (TID) | INTRAMUSCULAR | Status: DC | PRN

## 2017-12-18 MED ORDER — ACETAMINOPHEN 325 MG PO TABS
325.0000 mg | ORAL_TABLET | Freq: Four times a day (QID) | ORAL | Status: DC | PRN
  Administered 2017-12-19: 650 mg via ORAL
  Filled 2017-12-18: qty 2

## 2017-12-18 MED ORDER — FERROUS SULFATE 325 (65 FE) MG PO TABS
325.0000 mg | ORAL_TABLET | Freq: Three times a day (TID) | ORAL | Status: DC
  Administered 2017-12-18 – 2017-12-20 (×6): 325 mg via ORAL
  Filled 2017-12-18 (×6): qty 1

## 2017-12-18 MED ORDER — ASPIRIN EC 81 MG PO TBEC
81.0000 mg | DELAYED_RELEASE_TABLET | Freq: Two times a day (BID) | ORAL | Status: DC
  Administered 2017-12-18 – 2017-12-20 (×4): 81 mg via ORAL
  Filled 2017-12-18 (×4): qty 1

## 2017-12-18 MED ORDER — ROPIVACAINE HCL 5 MG/ML IJ SOLN
INTRAMUSCULAR | Status: DC | PRN
  Administered 2017-12-18 (×2): 5 mL via PERINEURAL

## 2017-12-18 MED ORDER — PROPOFOL 1000 MG/100ML IV EMUL
INTRAVENOUS | Status: AC
  Filled 2017-12-18: qty 100

## 2017-12-18 MED ORDER — METOCLOPRAMIDE HCL 5 MG PO TABS: 5.0000 mg | ORAL_TABLET | Freq: Three times a day (TID) | ORAL | Status: DC | PRN

## 2017-12-18 MED ORDER — SODIUM CHLORIDE 0.9 % IV SOLN
INTRAVENOUS | Status: DC | PRN
  Administered 2017-12-18: 25 ug/min via INTRAVENOUS

## 2017-12-18 MED ORDER — CHLORHEXIDINE GLUCONATE 4 % EX LIQD: 60.0000 mL | Freq: Once | CUTANEOUS | Status: DC

## 2017-12-18 MED ORDER — MIDAZOLAM HCL 2 MG/2ML IJ SOLN
INTRAMUSCULAR | Status: AC
  Filled 2017-12-18: qty 2

## 2017-12-18 MED ORDER — PHENOL 1.4 % MT LIQD: 1.0000 | OROMUCOSAL | Status: DC | PRN

## 2017-12-18 MED ORDER — MIDAZOLAM HCL 2 MG/2ML IJ SOLN
INTRAMUSCULAR | Status: DC | PRN
  Administered 2017-12-18: 2 mg via INTRAVENOUS

## 2017-12-18 MED ORDER — LIDOCAINE 2% (20 MG/ML) 5 ML SYRINGE
INTRAMUSCULAR | Status: DC | PRN
  Administered 2017-12-18: 40 mg via INTRAVENOUS

## 2017-12-18 MED ORDER — POLYETHYLENE GLYCOL 3350 17 G PO PACK
17.0000 g | PACK | Freq: Every day | ORAL | Status: DC | PRN
  Administered 2017-12-20: 17 g via ORAL
  Filled 2017-12-18: qty 1

## 2017-12-18 MED ORDER — GLIMEPIRIDE 4 MG PO TABS
2.0000 mg | ORAL_TABLET | Freq: Two times a day (BID) | ORAL | Status: DC
  Administered 2017-12-18 – 2017-12-20 (×4): 2 mg via ORAL
  Filled 2017-12-18 (×4): qty 1

## 2017-12-18 MED ORDER — ONDANSETRON HCL 4 MG/2ML IJ SOLN: 4.0000 mg | Freq: Four times a day (QID) | INTRAMUSCULAR | Status: DC | PRN

## 2017-12-18 MED ORDER — SODIUM CHLORIDE 0.9 % IR SOLN
Status: DC | PRN
  Administered 2017-12-18: 3000 mL

## 2017-12-18 MED ORDER — LEVOTHYROXINE SODIUM 100 MCG PO TABS
100.0000 ug | ORAL_TABLET | Freq: Every day | ORAL | Status: DC
  Administered 2017-12-19 – 2017-12-20 (×2): 100 ug via ORAL
  Filled 2017-12-18 (×2): qty 1

## 2017-12-18 MED ORDER — METHOCARBAMOL 500 MG PO TABS
500.0000 mg | ORAL_TABLET | Freq: Four times a day (QID) | ORAL | Status: DC | PRN
  Administered 2017-12-18 – 2017-12-19 (×3): 500 mg via ORAL
  Filled 2017-12-18 (×3): qty 1

## 2017-12-18 MED ORDER — HYDROMORPHONE HCL 1 MG/ML IJ SOLN
INTRAMUSCULAR | Status: AC
  Filled 2017-12-18: qty 1

## 2017-12-18 MED ORDER — FENTANYL CITRATE (PF) 250 MCG/5ML IJ SOLN
INTRAMUSCULAR | Status: AC
  Filled 2017-12-18: qty 5

## 2017-12-18 MED ORDER — PROMETHAZINE HCL 25 MG/ML IJ SOLN: 6.2500 mg | INTRAMUSCULAR | Status: DC | PRN

## 2017-12-18 MED ORDER — LACTATED RINGERS IV SOLN
INTRAVENOUS | Status: DC
  Administered 2017-12-18 (×2): via INTRAVENOUS

## 2017-12-18 MED ORDER — FENTANYL CITRATE (PF) 100 MCG/2ML IJ SOLN
100.0000 ug | Freq: Once | INTRAMUSCULAR | Status: AC
  Administered 2017-12-18: 100 ug via INTRAVENOUS

## 2017-12-18 MED ORDER — KETOROLAC TROMETHAMINE 30 MG/ML IJ SOLN: 30.0000 mg | Freq: Once | INTRAMUSCULAR | Status: DC | PRN

## 2017-12-18 MED ORDER — NIACIN ER (ANTIHYPERLIPIDEMIC) 500 MG PO TBCR
500.0000 mg | EXTENDED_RELEASE_TABLET | Freq: Every day | ORAL | Status: DC
  Administered 2017-12-18 – 2017-12-19 (×2): 500 mg via ORAL
  Filled 2017-12-18 (×3): qty 1

## 2017-12-18 MED ORDER — ROPIVACAINE HCL 7.5 MG/ML IJ SOLN
INTRAMUSCULAR | Status: DC | PRN
  Administered 2017-12-18 (×4): 5 mL via PERINEURAL

## 2017-12-18 MED ORDER — VITAMIN D (ERGOCALCIFEROL) 1.25 MG (50000 UNIT) PO CAPS
50000.0000 [IU] | ORAL_CAPSULE | ORAL | Status: DC
  Administered 2017-12-20: 50000 [IU] via ORAL
  Filled 2017-12-18: qty 1

## 2017-12-18 MED ORDER — OXYCODONE-ACETAMINOPHEN 5-325 MG PO TABS: 1.0000 | ORAL_TABLET | ORAL | 0 refills | Status: AC | PRN

## 2017-12-18 MED ORDER — CEFAZOLIN SODIUM-DEXTROSE 2-4 GM/100ML-% IV SOLN
2.0000 g | INTRAVENOUS | Status: AC
  Administered 2017-12-18: 2 g via INTRAVENOUS
  Filled 2017-12-18: qty 100

## 2017-12-18 MED ORDER — ONDANSETRON HCL 4 MG/2ML IJ SOLN
INTRAMUSCULAR | Status: AC
  Filled 2017-12-18: qty 2

## 2017-12-18 MED ORDER — CEFAZOLIN SODIUM-DEXTROSE 2-4 GM/100ML-% IV SOLN
2.0000 g | Freq: Four times a day (QID) | INTRAVENOUS | Status: AC
  Administered 2017-12-18 – 2017-12-19 (×2): 2 g via INTRAVENOUS
  Filled 2017-12-18 (×2): qty 100

## 2017-12-18 MED ORDER — MIDAZOLAM HCL 2 MG/2ML IJ SOLN
2.0000 mg | Freq: Once | INTRAMUSCULAR | Status: AC
  Administered 2017-12-18: 2 mg via INTRAVENOUS

## 2017-12-18 MED ORDER — LIDOCAINE 2% (20 MG/ML) 5 ML SYRINGE
INTRAMUSCULAR | Status: AC
  Filled 2017-12-18: qty 5

## 2017-12-18 MED ORDER — INSULIN ASPART 100 UNIT/ML ~~LOC~~ SOLN: 0.0000 [IU] | Freq: Every day | SUBCUTANEOUS | Status: DC

## 2017-12-18 MED ORDER — 0.9 % SODIUM CHLORIDE (POUR BTL) OPTIME
TOPICAL | Status: DC | PRN
  Administered 2017-12-18: 1000 mL

## 2017-12-18 MED ORDER — TRANEXAMIC ACID-NACL 1000-0.7 MG/100ML-% IV SOLN
1000.0000 mg | Freq: Once | INTRAVENOUS | Status: AC
  Administered 2017-12-18: 1000 mg via INTRAVENOUS
  Filled 2017-12-18: qty 100

## 2017-12-18 MED ORDER — IRBESARTAN 150 MG PO TABS
150.0000 mg | ORAL_TABLET | Freq: Every day | ORAL | Status: DC
  Administered 2017-12-18 – 2017-12-20 (×3): 150 mg via ORAL
  Filled 2017-12-18 (×3): qty 1

## 2017-12-18 SURGICAL SUPPLY — 63 items
AUGMENT PFC SIG RP SZ2.5 12.5M (Knees) IMPLANT
BANDAGE ESMARK 6X9 LF (GAUZE/BANDAGES/DRESSINGS) ×1 IMPLANT
BLADE SAG 18X100X1.27 (BLADE) ×2 IMPLANT
BLADE SAW SGTL 13X75X1.27 (BLADE) ×2 IMPLANT
BNDG CMPR 9X6 STRL LF SNTH (GAUZE/BANDAGES/DRESSINGS) ×1
BNDG CMPR MED 10X6 ELC LF (GAUZE/BANDAGES/DRESSINGS) ×1
BNDG ELASTIC 6X10 VLCR STRL LF (GAUZE/BANDAGES/DRESSINGS) ×2 IMPLANT
BNDG ESMARK 6X9 LF (GAUZE/BANDAGES/DRESSINGS) ×2
BNDG GAUZE ELAST 4 BULKY (GAUZE/BANDAGES/DRESSINGS) ×3 IMPLANT
BOWL SMART MIX CTS (DISPOSABLE) ×2 IMPLANT
CEMENT HV SMART SET (Cement) ×4 IMPLANT
CEMENT TIBIA MBT SIZE 2.5 (Knees) IMPLANT
COVER SURGICAL LIGHT HANDLE (MISCELLANEOUS) ×2 IMPLANT
COVER WAND RF STERILE (DRAPES) ×2 IMPLANT
CUFF TOURNIQUET SINGLE 44IN (TOURNIQUET CUFF) ×1 IMPLANT
DRAPE EXTREMITY T 121X128X90 (DRAPE) ×2 IMPLANT
DRAPE HALF SHEET 40X57 (DRAPES) ×2 IMPLANT
DRAPE U-SHAPE 47X51 STRL (DRAPES) ×2 IMPLANT
DRSG ADAPTIC 3X8 NADH LF (GAUZE/BANDAGES/DRESSINGS) ×2 IMPLANT
DRSG PAD ABDOMINAL 8X10 ST (GAUZE/BANDAGES/DRESSINGS) ×4 IMPLANT
DURAPREP 26ML APPLICATOR (WOUND CARE) ×2 IMPLANT
ELECT CAUTERY BLADE 6.4 (BLADE) ×2 IMPLANT
ELECT REM PT RETURN 9FT ADLT (ELECTROSURGICAL) ×2
ELECTRODE REM PT RTRN 9FT ADLT (ELECTROSURGICAL) ×1 IMPLANT
FEMUR SIGMA PS SZ 2.5 R (Femur) ×1 IMPLANT
GAUZE SPONGE 4X4 12PLY STRL (GAUZE/BANDAGES/DRESSINGS) ×2 IMPLANT
GLOVE BIOGEL PI ORTHO PRO 7.5 (GLOVE) ×1
GLOVE BIOGEL PI ORTHO PRO SZ8 (GLOVE) ×1
GLOVE ORTHO TXT STRL SZ7.5 (GLOVE) ×2 IMPLANT
GLOVE PI ORTHO PRO STRL 7.5 (GLOVE) ×1 IMPLANT
GLOVE PI ORTHO PRO STRL SZ8 (GLOVE) ×1 IMPLANT
GLOVE SURG ORTHO 8.5 STRL (GLOVE) ×2 IMPLANT
GOWN STRL REUS W/ TWL XL LVL3 (GOWN DISPOSABLE) ×3 IMPLANT
GOWN STRL REUS W/TWL XL LVL3 (GOWN DISPOSABLE) ×6
HANDPIECE INTERPULSE COAX TIP (DISPOSABLE) ×2
IMMOBILIZER KNEE 22 UNIV (SOFTGOODS) ×1 IMPLANT
KIT BASIN OR (CUSTOM PROCEDURE TRAY) ×2 IMPLANT
KIT MANIFOLD (MISCELLANEOUS) ×2 IMPLANT
KIT TURNOVER KIT B (KITS) ×2 IMPLANT
MANIFOLD NEPTUNE II (INSTRUMENTS) ×2 IMPLANT
NS IRRIG 1000ML POUR BTL (IV SOLUTION) ×2 IMPLANT
PACK TOTAL JOINT (CUSTOM PROCEDURE TRAY) ×2 IMPLANT
PAD ABD 8X10 STRL (GAUZE/BANDAGES/DRESSINGS) ×1 IMPLANT
PAD ARMBOARD 7.5X6 YLW CONV (MISCELLANEOUS) ×4 IMPLANT
PATELLA DOME PFC 35MM (Knees) ×1 IMPLANT
PFC SIGMA RP STB SZ 2.5 12.5M (Knees) ×2 IMPLANT
PIN STEINMAN FIXATION KNEE (PIN) ×1 IMPLANT
SET HNDPC FAN SPRY TIP SCT (DISPOSABLE) ×1 IMPLANT
STRIP CLOSURE SKIN 1/2X4 (GAUZE/BANDAGES/DRESSINGS) ×3 IMPLANT
SUCTION FRAZIER HANDLE 10FR (MISCELLANEOUS) ×1
SUCTION TUBE FRAZIER 10FR DISP (MISCELLANEOUS) ×1 IMPLANT
SUT MNCRL AB 3-0 PS2 18 (SUTURE) ×2 IMPLANT
SUT VIC AB 0 CT1 27 (SUTURE) ×4
SUT VIC AB 0 CT1 27XBRD ANBCTR (SUTURE) ×2 IMPLANT
SUT VIC AB 1 CT1 27 (SUTURE) ×6
SUT VIC AB 1 CT1 27XBRD ANBCTR (SUTURE) ×3 IMPLANT
SUT VIC AB 2-0 CT1 27 (SUTURE) ×4
SUT VIC AB 2-0 CT1 TAPERPNT 27 (SUTURE) ×2 IMPLANT
TIBIA MBT CEMENT SIZE 2.5 (Knees) ×2 IMPLANT
TOWEL OR 17X24 6PK STRL BLUE (TOWEL DISPOSABLE) ×2 IMPLANT
TOWEL OR 17X26 10 PK STRL BLUE (TOWEL DISPOSABLE) ×2 IMPLANT
TRAY CATH 16FR W/PLASTIC CATH (SET/KITS/TRAYS/PACK) IMPLANT
TRAY FOLEY MTR SLVR 16FR STAT (SET/KITS/TRAYS/PACK) IMPLANT

## 2017-12-18 NOTE — Op Note (Signed)
NAME: Terri Waters, [facility] MEDICAL RECORD YK:[id number] ACCOUNT [id number] DATE OF BIRTH:[birth year] FACILITY: [facility] LOCATION: [facility] PHYSICIAN:STEVEN Orlena Sheldon, MD  OPERATIVE REPORT  DATE OF PROCEDURE:  12/18/2017  PREOPERATIVE DIAGNOSIS:  Right knee end-stage osteoarthritis.  POSTOPERATIVE DIAGNOSIS:  Right knee end-stage osteoarthritis.  PROCEDURE PERFORMED:  Right total knee replacement using DePuy Sigma rotating platform prosthesis.  ATTENDING SURGEON:  Esmond Plants, MD  ASSISTANT:  Darol Destine, [place], who was scrubbed during the entire procedure and necessary for satisfactory completion of surgery.  ANESTHESIA:  Spinal anesthesia was used plus adductor canal block.  ESTIMATED BLOOD LOSS:  Minimal.  FLUID REPLACEMENT:  1500 mL crystalloid.  INSTRUMENT COUNTS:  Correct.  COMPLICATIONS:  No complications.  ANTIBIOTICS:  Perioperative antibiotics were given.  INDICATIONS:  The patient is a 62 year old female with worsening right knee pain secondary to end-stage osteoarthritis.  The patient has bone-on-bone arthritis on standing tunnel view x-rays and has had progressive pain and more difficulty with  ambulation.  She has pain which interferes with ADLs and also the rest pain and night pain.  Having failed all measures of conservative management including the arthroscopy, injections and pain medication, she presents now for total knee replacement to  relieve pain and restore function.  Informed consent was obtained.  DESCRIPTION OF PROCEDURE:  After an adequate level of anesthesia was achieved, the patient was positioned in the supine position, right leg correctly identified and a nonsterile tourniquet placed on proximal thigh.  Right leg sterilely prepped and draped  in the usual manner.  Timeout called.  After elevation of limb, we exsanguinated using an Esmarch bandage.  We elevated the tourniquet to 350 mmHg.  The patient's knee was placed in flexion.  A longitudinal  midline incision was created following the  re-verification of timeout.  We used the fresh 10-blade scalpel down through the subcutaneous tissues to the peripatellar tissues.  We performed a medial parapatellar arthrotomy with a fresh 10-blade scalpel, everting the patella and dividing lateral  patellofemoral ligaments.  We entered the distal femur with a step-cut drill and introduced the intramedullary resection guide, which we resected 10 mm of bone set on 5 degrees of valgus for the right knee.  Once we had the distal femoral resection  performed, we sized the femur to a size 2.5 anterior down, performed anterior, posterior and chamfer cuts off the 4-in-1 block.  We then resected ACL, PCL, and meniscal tissues, subluxed the tibia anteriorly and performed a tibial cut 90 degrees  perpendicular to the long axis of the tibia with minimal posterior slope for the posterior cruciate substituting prosthesis.  That was done with an oscillating saw.  We checked our flexion and extension gaps, which were symmetric at 10 mm, removed excess  posterior osteophytes off the posterior femoral condyle using a lamina spreader.  We then went ahead and completed our tibial preparation, sizing the tibia to a size 2.5, and did our modular drill and keel punch and placed our trial tibial tray.  We  then completed our box cut for the posterior cruciate substituting femoral prosthesis for the 2.5 right and impacted the 2.5 right trial in place and then placed first a 10 and then a 12.5 poly trial in place.  We were able to get to full extension and  had nice flexion stability.  We then resurfaced the patella going from a 24 mm thickness down to a 15 mm thickness and went ahead and cut using the patellar jig.  We then drilled  our lug holes for the 35 patellar button.  We placed the patella in place.   We then ranged the knee and had nice patellar tracking with no-touch technique.  We removed all trial components, irrigated  thoroughly, dried the bone well and then vacuum mixed DePuy high viscosity cement on the back table.  We then cemented all the  components into place, tibia, then femur, then patella, used a patellar clamp.  We had the knee in extension with a 12.5 poly insert until the cement hardened.  Once the cement hardened, we removed excess cement using 1/4-inch curved osteotome, rongeur  and Adson clamp.  Once we had all the excess bone cement removed, we did a final lavage and  inspection of the knee joint.  We then selected the real 12.5 poly and then pulled out the trial poly and replaced with a size 12.5 poly on the tibia and then  subluxed the tibia back onto the femur.  We had nice little pop in the medial side indicating appropriate tension and stability.  We had good stability, normal patellar tracking as we trialed the knee.  We irrigated thoroughly.  We then repaired the  parapatellar arthrotomy with #1 Vicryl suture, interrupted followed by 2-0 Vicryl for subcutaneous closure as well as 0 Vicryl for layered closure and made sure we had dead space closed and then a 4-0 running Monocryl for skin.  Steri-Strips were applied  followed by a sterile dressing.  The patient tolerated surgery well.  LN/NUANCE  D:12/18/2017 T:12/18/2017 JOB:[id number]

## 2017-12-18 NOTE — Brief Op Note (Signed)
12/18/2017  3:23 PM  PATIENT:  Terri Waters  62 y.o. female  PRE-OPERATIVE DIAGNOSIS:  Right knee end stage osteoarthritis  POST-OPERATIVE DIAGNOSIS:  Right knee end stage osteoarthritis  PROCEDURE:  Procedure(s): RIGHT TOTAL KNEE ARTHROPLASTY (Right) DePuy Sigma RP  SURGEON:  Surgeon(s) and Role:    Netta Cedars, MD - Primary  PHYSICIAN ASSISTANT:   ASSISTANTS: [place] Bruns, PA-C   ANESTHESIA:   regional and spinal  EBL:  Minimal    BLOOD ADMINISTERED:none  DRAINS: none   LOCAL MEDICATIONS USED:  NONE  SPECIMEN:  No Specimen  DISPOSITION OF SPECIMEN:  N/A  COUNTS:  YES  TOURNIQUET:   Total Tourniquet Time Documented: Thigh (Right) - 90 minutes Total: Thigh (Right) - 90 minutes   DICTATION: .Other Dictation: Dictation Number [id number]  PLAN OF CARE: Admit to inpatient   PATIENT DISPOSITION:  PACU - hemodynamically stable.   Delay start of Pharmacological VTE agent (>24hrs) due to surgical blood loss or risk of bleeding: no

## 2017-12-18 NOTE — Anesthesia Postprocedure Evaluation (Signed)
Anesthesia Post Note  Patient: Terri Waters  Procedure(s) Performed: RIGHT TOTAL KNEE ARTHROPLASTY (Right Knee)     Patient location during evaluation: PACU Anesthesia Type: Spinal Level of consciousness: oriented and awake and alert Pain management: pain level controlled Vital Signs Assessment: post-procedure vital signs reviewed and stable Respiratory status: spontaneous breathing and respiratory function stable Cardiovascular status: blood pressure returned to baseline and stable Postop Assessment: no headache, no backache, no apparent nausea or vomiting and patient able to bend at knees Anesthetic complications: no    Last Vitals:  Vitals:   12/18/17 1600 12/18/17 1604  BP:  140/72  Pulse: 78 75  Resp: 18 (!) 21  Temp:    SpO2: 99% 98%    Last Pain:  Vitals:   12/18/17 1613  TempSrc:   PainSc: 6                  Barnet Glasgow

## 2017-12-18 NOTE — Anesthesia Procedure Notes (Addendum)
Anesthesia Regional Block: Adductor canal block   Pre-Anesthetic Checklist: ,, timeout performed, Correct Patient, Correct Site, Correct Laterality, Correct Procedure, Correct Position, site marked, Risks and benefits discussed,  Surgical consent,  Pre-op evaluation,  At surgeon's request and post-op pain management  Laterality: Lower and Right  Prep: chloraprep       Needles:  Injection technique: Single-shot  Needle Type: Echogenic Stimulator Needle     Needle Length: 10cm  Needle Gauge: 21   Needle insertion depth: 4 cm   Additional Needles:   Procedures:,,,, ultrasound used (permanent image in chart),,,,  Narrative:  Start time: 12/18/2017 12:20 PM End time: 12/18/2017 12:31 PM Injection made incrementally with aspirations every 5 mL.  Performed by: Personally  Anesthesiologist: Lyn Hollingshead, MD

## 2017-12-18 NOTE — Anesthesia Procedure Notes (Signed)
Procedure Name: MAC Performed by: Alain Marion, CRNA Oxygen Delivery Method: Simple face mask Placement Confirmation: positive ETCO2

## 2017-12-18 NOTE — Transfer of Care (Signed)
Immediate Anesthesia Transfer of Care Note  Patient: Terri Waters  Procedure(s) Performed: RIGHT TOTAL KNEE ARTHROPLASTY (Right Knee)  Patient Location: PACU  Anesthesia Type:Spinal and MAC combined with regional for post-op pain  Level of Consciousness: awake, alert  and oriented  Airway & Oxygen Therapy: Patient Spontanous Breathing and Patient connected to nasal cannula oxygen  Post-op Assessment: Report given to RN and Post -op Vital signs reviewed and stable  Post vital signs: Reviewed and stable  Last Vitals:  Vitals Value Taken Time  BP 111/76 12/18/2017  3:34 PM  Temp    Pulse 74 12/18/2017  3:36 PM  Resp 21 12/18/2017  3:36 PM  SpO2 99 % 12/18/2017  3:36 PM  Vitals shown include unvalidated device data.  Last Pain:  Vitals:   12/18/17 1044  TempSrc:   PainSc: 4       Patients Stated Pain Goal: 2 (03/00/92 3300)  Complications: No apparent anesthesia complications

## 2017-12-18 NOTE — Interval H&P Note (Signed)
History and Physical Interval Note:  12/18/2017 12:56 PM  Terri Waters  has presented today for surgery, with the diagnosis of Right knee end stage osteoarthritis  The various methods of treatment have been discussed with the patient and family. After consideration of risks, benefits and other options for treatment, the patient has consented to  Procedure(s): TOTAL KNEE ARTHROPLASTY (Right) as a surgical intervention .  The patient's history has been reviewed, patient examined, no change in status, stable for surgery.  I have reviewed the patient's chart and labs.  Questions were answered to the patient's satisfaction.     Keyerra Lamere,STEVEN R

## 2017-12-18 NOTE — Progress Notes (Signed)
Orthopedic Tech Progress Note Patient Details:  ASHTAN LATON 08-10-1955 [id number]  CPM Right Knee CPM Right Knee: On Right Knee Flexion (Degrees): 40 Right Knee Extension (Degrees): 10 Additional Comments: foot roll  Post Interventions Patient Tolerated: Well Instructions Provided: Care of device, Adjustment of device  [place] Pink 12/18/2017, 4:51 PM

## 2017-12-18 NOTE — Anesthesia Procedure Notes (Signed)
Spinal  Patient location during procedure: OR Start time: 12/18/2017 1:30 PM End time: 12/18/2017 1:36 PM Staffing Anesthesiologist: Barnet Glasgow, MD Performed: anesthesiologist  Preanesthetic Checklist Completed: patient identified, surgical consent, pre-op evaluation, timeout performed, IV checked, risks and benefits discussed and monitors and equipment checked Spinal Block Patient position: sitting Prep: site prepped and draped and DuraPrep Patient monitoring: heart rate, cardiac monitor, continuous pulse ox and blood pressure Approach: midline Location: L3-4 Injection technique: single-shot Needle Needle type: Pencan  Needle gauge: 24 G Needle length: 10 cm Needle insertion depth: 7 cm Assessment Sensory level: T4 Additional Notes  2 Attempt (s). Pt tolerated procedure well.

## 2017-12-19 LAB — CBC
HCT: 32.6 % — ABNORMAL LOW (ref 36.0–46.0)
HEMOGLOBIN: 10.6 g/dL — AB (ref 12.0–15.0)
MCH: 28.2 pg (ref 26.0–34.0)
MCHC: 32.5 g/dL (ref 30.0–36.0)
MCV: 86.7 fL (ref 80.0–100.0)
Platelets: 248 10*3/uL (ref 150–400)
RBC: 3.76 MIL/uL — ABNORMAL LOW (ref 3.87–5.11)
RDW: 14.1 % (ref 11.5–15.5)
WBC: 11.5 10*3/uL — ABNORMAL HIGH (ref 4.0–10.5)
nRBC: 0 % (ref 0.0–0.2)

## 2017-12-19 LAB — BASIC METABOLIC PANEL
Anion gap: 12 (ref 5–15)
BUN: 8 mg/dL (ref 8–23)
CHLORIDE: 97 mmol/L — AB (ref 98–111)
CO2: 25 mmol/L (ref 22–32)
Calcium: 8.1 mg/dL — ABNORMAL LOW (ref 8.9–10.3)
Creatinine, Ser: 0.89 mg/dL (ref 0.44–1.00)
GFR calc Af Amer: >60 mL/min (ref >60)
GFR calc non Af Amer: >60 mL/min (ref >60)
Glucose, Bld: 128 mg/dL — ABNORMAL HIGH (ref 70–99)
Potassium: 3.4 mmol/L — ABNORMAL LOW (ref 3.5–5.1)
Sodium: 134 mmol/L — ABNORMAL LOW (ref 135–145)

## 2017-12-19 LAB — GLUCOSE, CAPILLARY
GLUCOSE-CAPILLARY: 138 mg/dL — AB (ref 70–99)
GLUCOSE-CAPILLARY: 141 mg/dL — AB (ref 70–99)
Glucose-Capillary: 123 mg/dL — ABNORMAL HIGH (ref 70–99)
Glucose-Capillary: 170 mg/dL — ABNORMAL HIGH (ref 70–99)
Glucose-Capillary: 160 mg/dL — ABNORMAL HIGH (ref 70–99)

## 2017-12-19 MED ORDER — PROMETHAZINE HCL 25 MG/ML IJ SOLN: 6.2500 mg | Freq: Four times a day (QID) | INTRAMUSCULAR | Status: DC | PRN

## 2017-12-19 MED ORDER — PROMETHAZINE HCL 25 MG PO TABS: 12.5000 mg | ORAL_TABLET | Freq: Four times a day (QID) | ORAL | Status: DC | PRN

## 2017-12-19 NOTE — Evaluation (Signed)
Physical Therapy Evaluation Patient Details Name: Terri Waters MRN: [id number] DOB: June 14, 1955 Today's Date: 12/19/2017   History of Present Illness  Patient is a 62 y/o female s/p R TKA on 12/18/17.  PMH significant for DM, HTN, cancer, migraines.  Clinical Impression  Terri Waters is a very pleasant 62 y/o female admitted with the above listed diagnosis. Patient reports prior to admission she was Mod I with SPC. Patient today requiring Min A/Min guard for transfers and mobility likely due to post-op pain and weakness. Patient ambulating for short distance with fatigue/pain primary limiting factor. PT to continue to follow to maximize safe and independent functional mobility prior to return home.     Follow Up Recommendations Follow surgeon's recommendation for DC plan and follow-up therapies;Supervision for mobility/OOB    Equipment Recommendations  Rolling walker with 5" wheels    Recommendations for Other Services       Precautions / Restrictions Precautions Precautions: Fall Restrictions Weight Bearing Restrictions: Yes RLE Weight Bearing: Weight bearing as tolerated      Mobility  Bed Mobility               General bed mobility comments: up in recliner  Transfers Overall transfer level: Needs assistance Equipment used: Rolling walker (2 wheeled) Transfers: Sit to/from Stand Sit to Stand: Min assist;Min guard         General transfer comment: light Min A to power up from recliner; cueing for hand placement  Ambulation/Gait Ambulation/Gait assistance: Min guard Gait Distance (Feet): 40 Feet Assistive device: Rolling walker (2 wheeled) Gait Pattern/deviations: Step-to pattern;Decreased stride length;Decreased stance time - right;Decreased weight shift to right;Antalgic Gait velocity: decreased   General Gait Details: patient limited by fatigue and general soreness.   Stairs            Wheelchair Mobility    Modified Rankin (Stroke Patients Only)        Balance Overall balance assessment: Needs assistance Sitting-balance support: No upper extremity supported;Feet supported Sitting balance-Leahy Scale: Good     Standing balance support: Bilateral upper extremity supported;During functional activity Standing balance-Leahy Scale: Poor Standing balance comment: reliant on RW                             Pertinent Vitals/Pain Pain Assessment: 0-10 Pain Score: 6  Pain Location: R knee Pain Descriptors / Indicators: Aching;Tightness Pain Intervention(s): Limited activity within patient's tolerance;Monitored during session;Repositioned    Home Living Family/patient expects to be discharged to:: Private residence Living Arrangements: Spouse/significant other Available Help at Discharge: Family;Available 24 hours/day Type of Home: House Home Access: Stairs to enter Entrance Stairs-Rails: Right Entrance Stairs-Number of Steps: 4 Home Layout: Two level;Able to live on main level with bedroom/bathroom Home Equipment: Kasandra Knudsen - single point      Prior Function Level of Independence: Independent with assistive device(s)         Comments: use of SPC for mobility     Hand Dominance        Extremity/Trunk Assessment   Upper Extremity Assessment Upper Extremity Assessment: Overall WFL for tasks assessed    Lower Extremity Assessment Lower Extremity Assessment: Generalized weakness;RLE deficits/detail RLE Deficits / Details: expected post-op weakness and pain       Communication   Communication: No difficulties  Cognition Arousal/Alertness: Awake/alert Behavior During Therapy: WFL for tasks assessed/performed Overall Cognitive Status: Within Functional Limits for tasks assessed  General Comments General comments (skin integrity, edema, etc.): husband present and supportive    Exercises     Assessment/Plan    PT Assessment Patient needs continued  PT services  PT Problem List Decreased strength;Decreased range of motion;Decreased activity tolerance;Decreased balance;Decreased mobility;Decreased knowledge of use of DME       PT Treatment Interventions DME instruction;Gait training;Stair training;Functional mobility training;Therapeutic activities;Therapeutic exercise;Balance training;Patient/family education    PT Goals (Current goals can be found in the Care Plan section)  Acute Rehab PT Goals Patient Stated Goal: return home improve pain PT Goal Formulation: With patient Time For Goal Achievement: 01/02/18 Potential to Achieve Goals: Good    Frequency 7X/week   Barriers to discharge        Co-evaluation               AM-PAC PT "6 Clicks" Mobility  Outcome Measure Help needed turning from your back to your side while in a flat bed without using bedrails?: A Lot Help needed moving from lying on your back to sitting on the side of a flat bed without using bedrails?: A Lot Help needed moving to and from a bed to a chair (including a wheelchair)?: A Little Help needed standing up from a chair using your arms (e.g., wheelchair or bedside chair)?: A Little Help needed to walk in hospital room?: A Little Help needed climbing 3-5 steps with a railing? : A Lot 6 Click Score: 15    End of Session Equipment Utilized During Treatment: Gait belt Activity Tolerance: Patient tolerated treatment well;Patient limited by fatigue Patient left: in chair;with call bell/phone within reach;with family/visitor present Nurse Communication: Mobility status PT Visit Diagnosis: Unsteadiness on feet (R26.81);Other abnormalities of gait and mobility (R26.89);Muscle weakness (generalized) (M62.81)    Time: 6256-3893 PT Time Calculation (min) (ACUTE ONLY): 26 min   Charges:   PT Evaluation $PT Eval Moderate Complexity: 1 Mod PT Treatments $Gait Training: 8-22 mins      Lanney Gins, PT, DPT Supplemental Physical  Therapist 12/19/17 10:33 AM Pager: [phone] Office: [phone]

## 2017-12-19 NOTE — Progress Notes (Signed)
Physical Therapy Treatment Patient Details Name: Terri Waters MRN: [id number] DOB: 1955-06-15 Today's Date: 12/19/2017    History of Present Illness Patient is a 62 y/o female s/p R TKA on 12/18/17.  PMH significant for DM, HTN, cancer, migraines.    PT Comments    Patient seen for progressive mobility. Tolerable to exercises prior to gait - does require some active assist from PT due to expected weakness/fatigue. Good tolerance for progressive mobility in hallway with RW. Making good progress towards goals. Will continue to follow.     Follow Up Recommendations  Follow surgeon's recommendation for DC plan and follow-up therapies;Supervision for mobility/OOB     Equipment Recommendations  Rolling walker with 5" wheels    Recommendations for Other Services       Precautions / Restrictions Precautions Precautions: Fall Restrictions Weight Bearing Restrictions: Yes RLE Weight Bearing: Weight bearing as tolerated    Mobility  Bed Mobility Overal bed mobility: Needs Assistance Bed Mobility: Supine to Sit;Sit to Supine     Supine to sit: Min assist Sit to supine: Min assist   General bed mobility comments: Min A for LE management - increased time and effort  Transfers Overall transfer level: Needs assistance Equipment used: Rolling walker (2 wheeled) Transfers: Sit to/from Stand Sit to Stand: Min guard         General transfer comment: min guard for safety - no phyiscal assist provided  Ambulation/Gait Ambulation/Gait assistance: Min guard Gait Distance (Feet): 90 Feet Assistive device: Rolling walker (2 wheeled) Gait Pattern/deviations: Step-to pattern;Decreased stride length;Decreased stance time - right;Decreased weight shift to right;Antalgic Gait velocity: decreased   General Gait Details: pateint able to progress mobility - fatigue limiting   Stairs             Wheelchair Mobility    Modified Rankin (Stroke Patients Only)       Balance  Overall balance assessment: Needs assistance Sitting-balance support: No upper extremity supported;Feet supported Sitting balance-Leahy Scale: Good     Standing balance support: Bilateral upper extremity supported;During functional activity Standing balance-Leahy Scale: Poor Standing balance comment: reliant on RW                            Cognition Arousal/Alertness: Awake/alert Behavior During Therapy: WFL for tasks assessed/performed Overall Cognitive Status: Within Functional Limits for tasks assessed                                        Exercises Total Joint Exercises Ankle Circles/Pumps: Both;10 reps Quad Sets: Right;10 reps Gluteal Sets: Both;10 reps Hip ABduction/ADduction: AAROM;Right;10 reps Straight Leg Raises: AAROM;Right;10 reps    General Comments General comments (skin integrity, edema, etc.): paeint requesting glucose check as she felt hypoglycemic - nursing notified      Pertinent Vitals/Pain Pain Assessment: 0-10 Pain Score: 6  Pain Location: R knee Pain Descriptors / Indicators: Aching;Tightness Pain Intervention(s): Limited activity within patient's tolerance;Monitored during session;Repositioned    Home Living                      Prior Function            PT Goals (current goals can now be found in the care plan section) Acute Rehab PT Goals Patient Stated Goal: return home improve pain PT Goal Formulation: With patient Time For Goal Achievement: 01/02/18  Potential to Achieve Goals: Good Progress towards PT goals: Progressing toward goals    Frequency    7X/week      PT Plan Current plan remains appropriate    Co-evaluation              AM-PAC PT "6 Clicks" Mobility   Outcome Measure  Help needed turning from your back to your side while in a flat bed without using bedrails?: A Little Help needed moving from lying on your back to sitting on the side of a flat bed without using  bedrails?: A Little Help needed moving to and from a bed to a chair (including a wheelchair)?: A Little Help needed standing up from a chair using your arms (e.g., wheelchair or bedside chair)?: A Little Help needed to walk in hospital room?: A Little Help needed climbing 3-5 steps with a railing? : A Lot 6 Click Score: 17    End of Session Equipment Utilized During Treatment: Gait belt Activity Tolerance: Patient tolerated treatment well;Patient limited by fatigue Patient left: with call bell/phone within reach;with family/visitor present;in bed;with nursing/sitter in room Nurse Communication: Mobility status PT Visit Diagnosis: Unsteadiness on feet (R26.81);Other abnormalities of gait and mobility (R26.89);Muscle weakness (generalized) (M62.81)     Time: 6629-4765 PT Time Calculation (min) (ACUTE ONLY): 26 min  Charges:  $Gait Training: 8-22 mins $Therapeutic Exercise: 8-22 mins                     Lanney Gins, PT, DPT Supplemental Physical Therapist 12/19/17 2:27 PM Pager: [phone] Office: [phone]

## 2017-12-19 NOTE — Plan of Care (Signed)

## 2017-12-19 NOTE — Progress Notes (Signed)
Subjective: 1 Day Post-Op Procedure(s) (LRB): RIGHT TOTAL KNEE ARTHROPLASTY (Right) Patient reports pain as moderate and severe.   Reports pain and nausea this AM. Foley just removed, has not voided yet No other c/o, denies CP, SOB, fever, chills, numbness, tingling, dizziness  Objective: Vital signs in last 24 hours: Temp:  [97.2 F (36.2 C)-99.1 F (37.3 C)] 99.1 F (37.3 C) (12/07 0551) Pulse Rate:  [69-90] 76 (12/07 0551) Resp:  [13-27] 20 (12/07 0551) BP: (111-171)/(50-115) 125/50 (12/07 0551) SpO2:  [95 %-100 %] 96 % (12/07 0551) Weight:  [103.2 kg] 103.2 kg (12/06 1044)  Intake/Output from previous day: 12/06 0701 - 12/07 0700 In: 6962 [P.O.:440; I.V.:1250] Out: 800 [Urine:775; Blood:25] Intake/Output this shift: No intake/output data recorded.  Recent Labs    12/19/17 0601  HGB 10.6*   Recent Labs    12/19/17 0601  WBC 11.5*  RBC 3.76*  HCT 32.6*  PLT 248   Recent Labs    12/19/17 0601  NA 134*  K 3.4*  CL 97*  CO2 25  BUN 8  CREATININE 0.89  GLUCOSE 128*  CALCIUM 8.1*   No results for input(s): LABPT, INR in the last 72 hours.  Neurologically intact ABD soft Neurovascular intact Sensation intact distally Intact pulses distally Dorsiflexion/Plantar flexion intact Incision: dressing C/D/I and no drainage No cellulitis present Compartment soft no calf pain or sign of DVT  Anticipated LOS equal to or greater than 2 midnights due to - Age 50 and older with one or more of the following:  - Obesity  - Expected need for hospital services (PT, OT, Nursing) required for safe  discharge  - Anticipated need for postoperative skilled nursing care or inpatient rehab  - Active co-morbidities: Diabetes and obesity  Assessment/Plan: 1 Day Post-Op Procedure(s) (LRB): RIGHT TOTAL KNEE ARTHROPLASTY (Right) Advance diet Up with therapy D/C IV fluids Will add phenergan for refractory N/V Dressing change tomorrow Possible D/C tomorrow vs Monday  depending on progress and pain Will watch electrolytes- slight abnormalities, asymptomatic  Jonel Weldon M. 12/19/2017, 8:31 AM

## 2017-12-20 LAB — CBC
HCT: 31.9 % — ABNORMAL LOW (ref 36.0–46.0)
Hemoglobin: 10.7 g/dL — ABNORMAL LOW (ref 12.0–15.0)
MCH: 28.4 pg (ref 26.0–34.0)
MCHC: 33.5 g/dL (ref 30.0–36.0)
MCV: 84.6 fL (ref 80.0–100.0)
Platelets: 260 10*3/uL (ref 150–400)
RBC: 3.77 MIL/uL — ABNORMAL LOW (ref 3.87–5.11)
RDW: 13.6 % (ref 11.5–15.5)
WBC: 12.8 10*3/uL — ABNORMAL HIGH (ref 4.0–10.5)
nRBC: 0 % (ref 0.0–0.2)

## 2017-12-20 LAB — GLUCOSE, CAPILLARY
Glucose-Capillary: 153 mg/dL — ABNORMAL HIGH (ref 70–99)
Glucose-Capillary: 155 mg/dL — ABNORMAL HIGH (ref 70–99)

## 2017-12-20 MED ORDER — BISACODYL 10 MG RE SUPP
10.0000 mg | Freq: Once | RECTAL | Status: AC
  Administered 2017-12-20: 10 mg via RECTAL
  Filled 2017-12-20: qty 1

## 2017-12-20 NOTE — Progress Notes (Signed)
Physical Therapy Treatment Patient Details Name: Terri Waters MRN: [id number] DOB: 1955-07-10 Today's Date: 12/20/2017    History of Present Illness Patient is a 62 y/o female s/p R TKA on 12/18/17.  PMH significant for DM, HTN, cancer, migraines.    PT Comments    Patient seen for mobility progression. Pt continues to make progress toward PT goals and tolerated mobility well with c/o 3/10 pain. Pt is able to ambulate 140 ft with RW and ascend/descned 5 steps simulating home entrance with min guard for safety. Pt's husband present and supportive. Current plan remains appropriate.    Follow Up Recommendations  Follow surgeon's recommendation for DC plan and follow-up therapies;Supervision for mobility/OOB     Equipment Recommendations  Rolling walker with 5" wheels    Recommendations for Other Services       Precautions / Restrictions Precautions Precautions: Fall Restrictions Weight Bearing Restrictions: Yes RLE Weight Bearing: Weight bearing as tolerated    Mobility  Bed Mobility               General bed mobility comments: pt OOB in chair upon arrival  Transfers Overall transfer level: Needs assistance Equipment used: Rolling walker (2 wheeled) Transfers: Sit to/from Stand Sit to Stand: Min guard         General transfer comment: cues for safe hand placement; min guard for safety - no phyiscal assist provided  Ambulation/Gait Ambulation/Gait assistance: Min guard Gait Distance (Feet): 140 Feet Assistive device: Rolling walker (2 wheeled) Gait Pattern/deviations: Decreased stride length;Decreased stance time - right;Decreased weight shift to right;Step-through pattern;Decreased step length - left Gait velocity: decreased   General Gait Details: cues for posture, R heel strike, and R knee flexion during swing phase; pt with improving R LE weight bearing and step length symmetry    Stairs Stairs: Yes Stairs assistance: Min guard Stair Management: One  rail Right;Step to pattern;Sideways   General stair comments: 4 steps sideways with R rail and single step backwards with RW to simulate steps entering home; min guard for safety; cues for sequencing and technique    Wheelchair Mobility    Modified Rankin (Stroke Patients Only)       Balance Overall balance assessment: Needs assistance Sitting-balance support: No upper extremity supported;Feet supported Sitting balance-Leahy Scale: Good     Standing balance support: Bilateral upper extremity supported;During functional activity Standing balance-Leahy Scale: Poor Standing balance comment: reliant on RW                            Cognition Arousal/Alertness: Awake/alert Behavior During Therapy: WFL for tasks assessed/performed Overall Cognitive Status: Within Functional Limits for tasks assessed                                        Exercises      General Comments General comments (skin integrity, edema, etc.): pt's husband present and supportive       Pertinent Vitals/Pain Pain Assessment: 0-10 Pain Score: 3  Pain Location: R knee with mobility Pain Descriptors / Indicators: Sore Pain Intervention(s): Monitored during session;Premedicated before session;Repositioned    Home Living                      Prior Function            PT Goals (current goals can now be found in  the care plan section) Acute Rehab PT Goals Patient Stated Goal: return home improve pain Progress towards PT goals: Progressing toward goals    Frequency    7X/week      PT Plan Current plan remains appropriate    Co-evaluation              AM-PAC PT "6 Clicks" Mobility   Outcome Measure  Help needed turning from your back to your side while in a flat bed without using bedrails?: A Little Help needed moving from lying on your back to sitting on the side of a flat bed without using bedrails?: A Little Help needed moving to and from a bed  to a chair (including a wheelchair)?: A Little Help needed standing up from a chair using your arms (e.g., wheelchair or bedside chair)?: A Little Help needed to walk in hospital room?: A Little Help needed climbing 3-5 steps with a railing? : A Little 6 Click Score: 18    End of Session Equipment Utilized During Treatment: Gait belt Activity Tolerance: Patient tolerated treatment well Patient left: with call bell/phone within reach;with family/visitor present;in chair Nurse Communication: Mobility status PT Visit Diagnosis: Unsteadiness on feet (R26.81);Other abnormalities of gait and mobility (R26.89);Muscle weakness (generalized) (M62.81)     Time: 9983-3825 PT Time Calculation (min) (ACUTE ONLY): 21 min  Charges:  $Gait Training: 8-22 mins                     Earney Navy, PTA Acute Rehabilitation Services Pager: [phone] Office: [phone]     Darliss Cheney 12/20/2017, 11:48 AM

## 2017-12-20 NOTE — Discharge Summary (Signed)
Orthopedic Discharge Summary        Physician Discharge Summary  Patient ID: Terri Waters MRN: [id number] DOB/AGE: 61/09/1955 61 y.o.  Admit date: 12/18/2017 Discharge date: 12/20/2017   Procedures:  Procedure(s) (LRB): RIGHT TOTAL KNEE ARTHROPLASTY (Right)  Attending Physician:  Dr. Esmond Plants  Admission Diagnoses:    Right knee end staged OA  Discharge Diagnoses:  OA   Past Medical History:  Diagnosis Date  . Cancer (HCC)    papillary carcinoma  . Diabetes mellitus without complication ([place])   . High cholesterol   . Hypertension   . Hypothyroidism   . Migraines   . Thyroid disease   . Ventricular tachycardia ([place])     PCP: [name], MD   Discharged Condition: good  Hospital Course:  Patient underwent the above stated procedure on 12/18/2017. Patient tolerated the procedure well and brought to the recovery room in good condition and subsequently to the floor. Patient had an uncomplicated hospital course and was stable for discharge.   Disposition: Discharge disposition: 01-Home or Self Care      with follow up in 2 weeks   Follow-up Information    Call Netta Cedars, MD.   Specialty:  Orthopedic Surgery Why:  [phone] Contact information: [address] [place] [address] [place] [address] [phone]        [facility] Follow up.   Specialty:  Home Health Services Contact information: [address] [place] [address] [phone]           Discharge Instructions    Call MD / Call 911   Complete by:  As directed    If you experience chest pain or shortness of breath, CALL 911 and be transported to the hospital emergency room.  If you develope a fever above 101 F, pus (white drainage) or increased drainage or redness at the wound, or calf pain, call your surgeon's office.   Call MD / Call 911   Complete by:  As directed    If you experience chest pain or shortness of breath, CALL 911  and be transported to the hospital emergency room.  If you develope a fever above 101 F, pus (white drainage) or increased drainage or redness at the wound, or calf pain, call your surgeon's office.   Change dressing   Complete by:  As directed    Change dressing on Wednesday to the other Aquacel, then remove on Friday and leave off   Constipation Prevention   Complete by:  As directed    Drink plenty of fluids.  Prune juice may be helpful.  You may use a stool softener, such as Colace (over the counter) 100 mg twice a day.  Use MiraLax (over the counter) for constipation as needed.   Constipation Prevention   Complete by:  As directed    Drink plenty of fluids.  Prune juice may be helpful.  You may use a stool softener, such as Colace (over the counter) 100 mg twice a day.  Use MiraLax (over the counter) for constipation as needed.   Diet - low sodium heart healthy   Complete by:  As directed    Diet - low sodium heart healthy   Complete by:  As directed    Do not put a pillow under the knee. Place it under the heel.   Complete by:  As directed    Driving restrictions   Complete by:  As directed  No driving for 61 weeks   Increase activity slowly as tolerated   Complete by:  As directed    Increase activity slowly as tolerated   Complete by:  As directed    TED hose   Complete by:  As directed    Use stockings (TED hose) for 4 weeks on both leg(s).  You may remove them for bathing or laundering them.   Weight bearing as tolerated   Complete by:  As directed    Laterality:  right   Extremity:  Lower      Allergies as of 12/20/2017      Reactions   Jardiance [empagliflozin]    Frequent UTI   Tape Rash   Clear plastic tape      Medication List    TAKE these medications   aspirin 81 MG tablet Take 1 tablet (81 mg total) by mouth 2 (two) times daily. What changed:  when to take this   atorvastatin 40 MG tablet Commonly known as:  LIPITOR Take 40 mg by mouth daily.     glimepiride 2 MG tablet Commonly known as:  AMARYL Take 2 mg by mouth 2 (two) times daily.   hydrochlorothiazide 25 MG tablet Commonly known as:  HYDRODIURIL Take 25 mg by mouth daily.   levothyroxine 100 MCG tablet Commonly known as:  SYNTHROID, LEVOTHROID Take 100 mcg by mouth daily before breakfast.   metFORMIN 500 MG 24 hr tablet Commonly known as:  GLUCOPHAGE-XR Take 1,000 mg by mouth 2 (two) times daily.   methocarbamol 500 MG tablet Commonly known as:  ROBAXIN Take 1 tablet (500 mg total) by mouth 3 (three) times daily as needed.   metoprolol succinate 50 MG 24 hr tablet Commonly known as:  TOPROL-XL Take 50 mg by mouth daily. Take with or immediately following a meal.   niacin 500 MG CR tablet Commonly known as:  NIASPAN Take 500 mg by mouth daily.   olmesartan 20 MG tablet Commonly known as:  BENICAR Take 20 mg by mouth 2 (two) times daily.   oxyCODONE-acetaminophen 5-325 MG tablet Commonly known as:  PERCOCET/ROXICET Take 1-2 tablets by mouth every 4 (four) hours as needed for severe pain.   pantoprazole 40 MG tablet Commonly known as:  PROTONIX Take 40 mg by mouth 2 (two) times daily before a meal.   Vitamin D3 1.25 MG (50000 UT) Caps Take 50,000 Units by mouth every Sunday.            Durable Medical Equipment  (From admission, onward)         Start     Ordered   12/18/17 1706  DME Walker rolling  Once    Question:  Patient needs a walker to treat with the following condition  Answer:  Status post total knee replacement, right   12/18/17 1705   12/18/17 1706  DME 3 n 1  Once     12/18/17 1705           Discharge Care Instructions  (From admission, onward)         Start     Ordered   12/20/17 0000  Weight bearing as tolerated    Question Answer Comment  Laterality right   Extremity Lower      12 /08/19 0955   12/20/17 0000  Change dressing    Comments:  Change dressing on Wednesday to the other Aquacel, then remove on Friday and  leave off   12/20/17 1202  Signed: Augustin Schooling 12/20/2017, 12:04 PM  [facility] is now [facility] [address] [place], [place], [place] [address] Phone: [place]

## 2017-12-20 NOTE — Progress Notes (Signed)
1430 Pt had a BM after taking Miralax and Dulcolax supp. Discharge instructions given to pt, verbalized understanding. Discharged to home accompanied by spouse.

## 2017-12-20 NOTE — Progress Notes (Signed)
Physical Therapy Treatment Patient Details Name: Terri Waters MRN: [id number] DOB: 10-28-1955 Today's Date: 12/20/2017    History of Present Illness Patient is a 62 y/o female s/p R TKA on 12/18/17.  PMH significant for DM, HTN, cancer, migraines.    PT Comments    Patient seen to progress mobility. Patient ambulating well with RW - does not need physical assist for steadying or R LE advancement. Patient educated on car transfers as well as expectations for R LE propping to promote extension and general safety in the home environment. Patient making good progress towards all goals. PT to continue to follow.    Follow Up Recommendations  Follow surgeon's recommendation for DC plan and follow-up therapies;Supervision for mobility/OOB     Equipment Recommendations  Rolling walker with 5" wheels    Recommendations for Other Services       Precautions / Restrictions Precautions Precautions: Fall Restrictions Weight Bearing Restrictions: Yes RLE Weight Bearing: Weight bearing as tolerated    Mobility  Bed Mobility               General bed mobility comments: pt OOB in chair upon arrival  Transfers Overall transfer level: Needs assistance Equipment used: Rolling walker (2 wheeled) Transfers: Sit to/from Stand Sit to Stand: Min guard         General transfer comment: patient sitting in recliner with small towel roll under ankle - education on larger roll or use of blue foam to promote extension; with transfers min guard only for safety - no physical assist  Ambulation/Gait Ambulation/Gait assistance: Min guard Gait Distance (Feet): 150 Feet Assistive device: Rolling walker (2 wheeled) Gait Pattern/deviations: Decreased stride length;Decreased stance time - right;Decreased weight shift to right;Step-through pattern;Decreased step length - left Gait velocity: decreased   General Gait Details: cueing for step length adn heel strike to promote a normalized gait  pattern   Stairs Stairs: Yes Stairs assistance: Min guard Stair Management: One rail Right;Step to pattern;Sideways   General stair comments: 4 steps sideways with R rail and single step backwards with RW to simulate steps entering home; min guard for safety; cues for sequencing and technique    Wheelchair Mobility    Modified Rankin (Stroke Patients Only)       Balance Overall balance assessment: Needs assistance Sitting-balance support: No upper extremity supported;Feet supported Sitting balance-Leahy Scale: Good     Standing balance support: Bilateral upper extremity supported;During functional activity Standing balance-Leahy Scale: Poor Standing balance comment: reliant on RW                            Cognition Arousal/Alertness: Awake/alert Behavior During Therapy: WFL for tasks assessed/performed Overall Cognitive Status: Within Functional Limits for tasks assessed                                        Exercises      General Comments General comments (skin integrity, edema, etc.): daughter present and supportive - education on car transfers for greater ease; education on propping of knee for extension      Pertinent Vitals/Pain Pain Assessment: 0-10 Pain Score: 2  Pain Location: R knee with mobility Pain Descriptors / Indicators: Sore Pain Intervention(s): Monitored during session;Limited activity within patient's tolerance;Repositioned    Home Living  Prior Function            PT Goals (current goals can now be found in the care plan section) Acute Rehab PT Goals Patient Stated Goal: return home improve pain PT Goal Formulation: With patient Time For Goal Achievement: 01/02/18 Potential to Achieve Goals: Good Progress towards PT goals: Progressing toward goals    Frequency    7X/week      PT Plan Current plan remains appropriate    Co-evaluation              AM-PAC PT "6  Clicks" Mobility   Outcome Measure  Help needed turning from your back to your side while in a flat bed without using bedrails?: A Little Help needed moving from lying on your back to sitting on the side of a flat bed without using bedrails?: A Little Help needed moving to and from a bed to a chair (including a wheelchair)?: A Little Help needed standing up from a chair using your arms (e.g., wheelchair or bedside chair)?: A Little Help needed to walk in hospital room?: A Little Help needed climbing 3-5 steps with a railing? : A Little 6 Click Score: 18    End of Session Equipment Utilized During Treatment: Gait belt Activity Tolerance: Patient tolerated treatment well Patient left: with call bell/phone within reach;with family/visitor present;in chair Nurse Communication: Mobility status PT Visit Diagnosis: Unsteadiness on feet (R26.81);Other abnormalities of gait and mobility (R26.89);Muscle weakness (generalized) (M62.81)     Time: 7579-7282 PT Time Calculation (min) (ACUTE ONLY): 14 min  Charges:  $Gait Training: 8-22 mins                     Lanney Gins, PT, DPT Supplemental Physical Therapist 12/20/17 2:12 PM Pager: [phone] Office: [phone]

## 2017-12-20 NOTE — Progress Notes (Signed)
Subjective: 2 Days Post-Op Procedure(s) (LRB): RIGHT TOTAL KNEE ARTHROPLASTY (Right) Patient reports pain as mild.    Objective: Vital signs in last 24 hours: Temp:  [98.3 F (36.8 C)-99.2 F (37.3 C)] 99.2 F (37.3 C) (12/07 2033) Pulse Rate:  [67-81] 72 (12/08 0525) Resp:  [16-18] 16 (12/08 0525) BP: (134-160)/(64-77) 134/64 (12/08 0525) SpO2:  [95 %-99 %] 99 % (12/08 0525)  Intake/Output from previous day: 12/07 0701 - 12/08 0700 In: 900 [P.O.:600; I.V.:300] Out: -  Intake/Output this shift: No intake/output data recorded.  Recent Labs    12/19/17 0601 12/20/17 0224  HGB 10.6* 10.7*   Recent Labs    12/19/17 0601 12/20/17 0224  WBC 11.5* 12.8*  RBC 3.76* 3.77*  HCT 32.6* 31.9*  PLT 248 260   Recent Labs    12/19/17 0601  NA 134*  K 3.4*  CL 97*  CO2 25  BUN 8  CREATININE 0.89  GLUCOSE 128*  CALCIUM 8.1*   No results for input(s): LABPT, INR in the last 72 hours.  PE:  incision healing well with no signs of infection.  Knee with full extension.  Anticipated LOS equal to or greater than 2 midnights due to - Age 24 and older with one or more of the following:  - Obesity  - Expected need for hospital services (PT, OT, Nursing) required for safe  discharge  - Anticipated need for postoperative skilled nursing care or inpatient rehab  - Active co-morbidities: None OR   - Unanticipated findings during/Post Surgery: Slow post-op progression: GI, pain control, mobility  - Patient is a high risk of re-admission due to: None   Assessment/Plan: 2 Days Post-Op Procedure(s) (LRB): RIGHT TOTAL KNEE ARTHROPLASTY (Right) Discharge home with home health if she does well with PT today.  Otherwise she will stay another night to continue working with PT.    Wylene Simmer 12/20/2017, 9:52 AM

## 2017-12-20 NOTE — Discharge Instructions (Signed)
Ice to the knee constantly.  Keep the incision covered and clean and dry for one week, then ok to get it wet in the shower.  Do exercise as instructed several times per day. Keep the knee in the brace at night to maintain knee extension.   DO NOT prop anything under the knee, prop under the ankle to keep the knee straight. Ok to bear full weight on the right leg.  Use a walker while you are up for balance and support.  Take baby aspirin (81mg  chewable) twice per day for 30 days and wear the knee high support hose. To prevent blood clots  Follow up with Dr Veverly Fells in two weeks in the office, call [phone] for appt

## 2017-12-20 NOTE — Care Management Note (Addendum)
Case Management Note  Patient Details  Name: Terri Waters MRN: [id number] Date of Birth: 1955/06/09  Subjective/Objective:       Pt to d/c home with husband.  Pt may d/c today after therapy and may decided to stay until tomorrow.  Pt reviewed Medicare rated list and chooses [facility] for [facility] services.  Pt needs RW, 3n1, and CPM.    Action/Plan: D/W Ruby Cola from [place] and facesheet faxed for delivery of CPM tomorrow.    RW and 3n1 to be delivered to room by [facility].  HH referral called to Glyn Ade with [facility]              Expected Discharge Date:  12/20/17               Expected Discharge Plan:  [place]  In-House Referral:  NA  Discharge planning Services  CM Consult  Post Acute Care Choice:  Durable Medical Equipment, Home Health Choice offered to:  Patient  DME Arranged:  3-N-1, Walker rolling DME Agency:  [place]:  PT, OT, NA [place] Agency:  [facility]  Status of Service:  Completed, signed off  If discussed at [place] of Stay Meetings, dates discussed:    Additional Comments: Dr. Veverly Fells does not want patient to have a CPM per RN.  [employer] advised.  Claudie Leach, RN 12/20/2017, 11:52 AM

## 2017-12-21 ENCOUNTER — Encounter (HOSPITAL_COMMUNITY): Payer: Self-pay | Admitting: Orthopedic Surgery

## 2017-12-29 ENCOUNTER — Encounter: Payer: Self-pay | Admitting: Physical Therapy

## 2017-12-29 ENCOUNTER — Other Ambulatory Visit: Payer: Self-pay

## 2017-12-29 ENCOUNTER — Ambulatory Visit: Payer: 59 | Attending: Orthopedic Surgery | Admitting: Physical Therapy

## 2017-12-29 DIAGNOSIS — R2689 Other abnormalities of gait and mobility: Secondary | ICD-10-CM | POA: Diagnosis present

## 2017-12-29 DIAGNOSIS — R600 Localized edema: Secondary | ICD-10-CM

## 2017-12-29 DIAGNOSIS — M25561 Pain in right knee: Secondary | ICD-10-CM | POA: Diagnosis present

## 2017-12-29 DIAGNOSIS — M25661 Stiffness of right knee, not elsewhere classified: Secondary | ICD-10-CM | POA: Insufficient documentation

## 2017-12-29 DIAGNOSIS — M6281 Muscle weakness (generalized): Secondary | ICD-10-CM | POA: Diagnosis present

## 2017-12-29 DIAGNOSIS — R262 Difficulty in walking, not elsewhere classified: Secondary | ICD-10-CM | POA: Insufficient documentation
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
## 2017-12-30 ENCOUNTER — Ambulatory Visit: Payer: 59 | Admitting: Physical Therapy

## 2017-12-30 DIAGNOSIS — M6281 Muscle weakness (generalized): Secondary | ICD-10-CM

## 2017-12-30 DIAGNOSIS — M25661 Stiffness of right knee, not elsewhere classified: Secondary | ICD-10-CM

## 2017-12-30 DIAGNOSIS — R262 Difficulty in walking, not elsewhere classified: Secondary | ICD-10-CM

## 2017-12-30 DIAGNOSIS — M25561 Pain in right knee: Secondary | ICD-10-CM

## 2017-12-30 DIAGNOSIS — R2689 Other abnormalities of gait and mobility: Secondary | ICD-10-CM

## 2017-12-30 DIAGNOSIS — R600 Localized edema: Secondary | ICD-10-CM

## 2017-12-30 NOTE — Therapy (Signed)
[facility] [address]  [place], [place], [address] Phone: [phone]   Fax:  [phone]  Physical Therapy Treatment  Patient Details  Name: Terri Waters MRN: [id number] Date of Birth: 1955/04/25 Referring Provider (PT): Netta Cedars, MD   Encounter Date: 12/30/2017  PT End of Session - 12/30/17 1336    Visit Number  2    Number of Visits  16    Date for PT Re-Evaluation  02/09/18    Authorization Type  UHC    Authorization - Number of Visits  50   9 used prior to eval   PT Start Time  1015    PT Stop Time  1108    PT Time Calculation (min)  53 min    Activity Tolerance  Patient limited by pain    Behavior During Therapy  [facility] for tasks assessed/performed       Past Medical History:  Diagnosis Date  . Cancer (HCC)    papillary carcinoma  . Diabetes mellitus without complication ([place])   . High cholesterol   . Hypertension   . Hypothyroidism   . Migraines   . Thyroid disease   . Ventricular tachycardia [facility])     Past Surgical History:  Procedure Laterality Date  . ABDOMINAL HYSTERECTOMY    . BREAST SURGERY     lumps removed - over 20 years ago  . THYROIDECTOMY    . TOTAL KNEE ARTHROPLASTY Right 12/18/2017   Procedure: RIGHT TOTAL KNEE ARTHROPLASTY;  Surgeon: Netta Cedars, MD;  Location: [place];  Service: Orthopedics;  Laterality: Right;    There were no vitals filed for this visit.  Subjective Assessment - 12/30/17 1323    Subjective  Pt relays severe pain in her knee last night and she had to call the MD office who recommended she try heat on back of knee and ice on front and that if pain continued like this the next day to come in. She will return back to MD tommorow however.    Pertinent History  R TKR 12/18/17    Patient Stated Goals  "to get the soreness out of my knee and walk normally again"    Currently in Pain?  Yes    Pain Score  9     Pain Location  Knee    Pain Orientation   Right;Anterior;Posterior    Pain Descriptors / Indicators  Aching;Throbbing;Sharp    Pain Type  Surgical pain                       [facility] Adult PT Treatment/Exercise - 12/30/17 0001      Ambulation/Gait   Ambulation/Gait Assistance  6: Modified independent (Device/Increase time)    Ambulation Distance (Feet)  90 Feet    Assistive device  Rolling walker    Gait Pattern  Step-through pattern;Decreased weight shift to right;Decreased stance time - right;Decreased stride length;Decreased hip/knee flexion - right;Antalgic    Ambulation Surface  Level;Indoor    Gait velocity  slow      Exercises   Exercises  Knee/Hip      Knee/Hip Exercises: Stretches   Passive Hamstring Stretch  Right;2 reps;30 seconds    Passive Hamstring Stretch Limitations  supine with strap    Knee: Self-Stretch to increase Flexion  Right    Knee: Self-Stretch Limitations  10 reps supine with strap heelslide 5 sec hold, 10 reps sitting heelslide 5 sec hold  Gastroc Stretch  Right;2 reps;30 seconds    Gastroc Stretch Limitations  supine with strap      Knee/Hip Exercises: Seated   Long Arc Quad  Right;2 sets;10 reps    [employer]  5 sec X 15      Knee/Hip Exercises: Supine   Quad Sets  Right;15 reps    [employer] Limitations  5 sec hold    Short Arc Quad Sets  2 sets;5 reps;Right      Modalities   Modalities  Cryotherapy;Electrical Stimulation;Moist Heat      Moist Heat Therapy   Number Minutes Moist Heat  10 Minutes    Moist Heat Location  --   hamstring     Cryotherapy   Number Minutes Cryotherapy  10 Minutes    Cryotherapy Location  Knee   anterior   Type of Cryotherapy  Ice pack      Electrical Stimulation   Electrical Stimulation Location  Rt knee    Electrical Stimulation Action  IFC    Electrical Stimulation Parameters  tolerance    Electrical Stimulation Goals  Pain               PT Short Term Goals - 12/29/17 0943      PT SHORT TERM GOAL #1   Title   Independent with initial HEP    Status  New    Target Date  01/12/18      PT SHORT TERM GOAL #2   Title  R knee AROM >/= 5-90 dg     Status  New    Target Date  01/12/18      PT SHORT TERM GOAL #3   Title  Pt will safety ambulate with [place] household and limited community distances to increase community access    Status  New    Target Date  01/19/18        PT Long Term Goals - 12/29/17 0943      PT LONG TERM GOAL #1   Title  Independent with ongoing/advanced HEP    Status  New    Target Date  02/09/18      PT LONG TERM GOAL #2   Title  R knee AROM >/= 3-110 dg     Status  New    Target Date  02/09/18      PT LONG TERM GOAL #3   Title  R hip and knee strength >/= 4+/5 for improved stability    Status  New    Target Date  02/09/18      PT LONG TERM GOAL #4   Title  Pt will ambulate with normal gait pattern with or w/o LRAD for safe community access    Status  New    Target Date  02/09/18      PT LONG TERM GOAL #5   Title  Pt will negotiate 1 flight of stairs reciprocally with normal step pattern to allow safe access to master bed and bathroom in home    Status  New    Target Date  02/09/18            Plan - 12/30/17 1337    Clinical Impression Statement  Pt came in reporting severe pain today so session focused on gentle ROM, stretching, and muscle activation in gentle pain free ROM. After this therex her knee pain had calmed down some and she was able to ambulate 90 ft in clinic with RW. She was then trialed with TENS IFC  for pain as well as heat to hamstring and ice pack to anterior knee to reduce pain and inflammation. Pt will continue to be gentle with progression as tolerated.     Rehab Potential  Good    PT Frequency  3x / week    PT Duration  6 weeks    PT Treatment/Interventions  Patient/family education;Therapeutic exercise;Therapeutic activities;Functional mobility training;Gait training;Stair training;Balance training;Manual techniques;Passive range of  motion;Scar mobilization;Taping;Dry needling;Electrical Stimulation;Cryotherapy;Vasopneumatic Device;ADLs/Self Care Home Management    PT Next Visit Plan  TKR protocol - ROM, quad/HS & hip strengthening, stretching, gait training working toward weaning assistive device, vasopneumatic comppression for pain/edema    Consulted and Agree with Plan of Care  Patient       Patient will benefit from skilled therapeutic intervention in order to improve the following deficits and impairments:  Pain, Decreased range of motion, Impaired flexibility, Decreased strength, Decreased scar mobility, Decreased mobility, Decreased activity tolerance, Difficulty walking, Abnormal gait, Decreased balance  Visit Diagnosis: Acute pain of right knee  Stiffness of right knee, not elsewhere classified  Muscle weakness (generalized)  Difficulty in walking, not elsewhere classified  Other abnormalities of gait and mobility  Localized edema     Problem List Patient Active Problem List   Diagnosis Date Noted  . 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 Migraine with aura and without status migrainosus, not intractable 04/09/2017    Debbe Odea, PT,DPT 12/30/2017, 1:40 PM  [facility] [address]  [place], [place], [address] Phone: [phone]   Fax:  [phone]  Name: Terri Waters MRN: [id number] Date of Birth: 03/28/1955

## 2018-01-01 ENCOUNTER — Ambulatory Visit: Payer: 59 | Admitting: Physical Therapy

## 2018-01-01 ENCOUNTER — Encounter: Payer: Self-pay | Admitting: Physical Therapy

## 2018-01-01 DIAGNOSIS — M25561 Pain in right knee: Secondary | ICD-10-CM | POA: Diagnosis not present

## 2018-01-01 DIAGNOSIS — R600 Localized edema: Secondary | ICD-10-CM

## 2018-01-01 DIAGNOSIS — M6281 Muscle weakness (generalized): Secondary | ICD-10-CM

## 2018-01-01 DIAGNOSIS — R262 Difficulty in walking, not elsewhere classified: Secondary | ICD-10-CM

## 2018-01-01 DIAGNOSIS — R2689 Other abnormalities of gait and mobility: Secondary | ICD-10-CM

## 2018-01-01 DIAGNOSIS — M25661 Stiffness of right knee, not elsewhere classified: Secondary | ICD-10-CM

## 2018-01-01 NOTE — Therapy (Signed)
[facility] [address]  [place], [place], [address] Phone: [phone]   Fax:  [phone]  Physical Therapy Treatment  Patient Details  Name: Terri Waters MRN: [id number] Date of Birth: 1955-12-14 Referring Provider (PT): Netta Cedars, MD   Encounter Date: 01/01/2018  PT End of Session - 01/01/18 1014    Visit Number  3    Number of Visits  16    Date for PT Re-Evaluation  02/09/18    Authorization Type  [employer]    Authorization - Number of Visits  50   9 used prior to eval   PT Start Time  1014    PT Stop Time  1118    PT Time Calculation (min)  64 min    Activity Tolerance  Patient limited by pain    Behavior During Therapy  [facility] for tasks assessed/performed       Past Medical History:  Diagnosis Date  . Cancer (HCC)    papillary carcinoma  . Diabetes mellitus without complication ([place])   . High cholesterol   . Hypertension   . Hypothyroidism   . Migraines   . Thyroid disease   . Ventricular tachycardia [facility])     Past Surgical History:  Procedure Laterality Date  . ABDOMINAL HYSTERECTOMY    . BREAST SURGERY     lumps removed - over 20 years ago  . THYROIDECTOMY    . TOTAL KNEE ARTHROPLASTY Right 12/18/2017   Procedure: RIGHT TOTAL KNEE ARTHROPLASTY;  Surgeon: Netta Cedars, MD;  Location: [place];  Service: Orthopedics;  Laterality: Right;    There were no vitals filed for this visit.  Subjective Assessment - 01/01/18 1016    Subjective  Pt reporting pain better today. Saw MD yesterday and x-rays taken - eveything looked good.    Pertinent History  R TKR 12/18/17    Patient Stated Goals  "to get the soreness out of my knee and walk normally again"    Currently in Pain?  Yes    Pain Score  5    4-5/10   Pain Location  Knee    Pain Orientation  Right    Pain Descriptors / Indicators  Tightness   "stiff & swollen"   Pain Type  Surgical pain;Acute pain         [facility] PT Assessment -  01/01/18 1014      Assessment   Medical Diagnosis  R TKR    Referring Provider (PT)  Netta Cedars, MD    Onset Date/Surgical Date  12/18/17    Next MD Visit  01/26/18                   [facility] [place] Adult PT Treatment/Exercise - 01/01/18 1014      Ambulation/Gait   Ambulation/Gait Assistance  6: Modified independent (Device/Increase time)    Ambulation/Gait Assistance Details  Cues for upright posture, increased hip and knee flexion, heel strike on weight acceptance and full hip extension to normalize gait pattern.    Ambulation Distance (Feet)  90 Feet    Assistive device  Rolling walker    Gait Pattern  Step-through pattern;Decreased weight shift to right;Decreased stance time - right;Decreased stride length;Decreased hip/knee flexion - right;Antalgic    Ambulation Surface  Level;Indoor      Self-Care   Self-Care  Heat/Ice Application;Other Self-Care Comments    Heat/Ice Application  precautions with ice/heat application    Other Self-Care Comments   TENS  instruction      Exercises   Exercises  Knee/Hip      Knee/Hip Exercises: Aerobic   Nustep  L3 x 6 min      Knee/Hip Exercises: Standing   Knee Flexion  Right;10 reps;AROM    Knee Flexion Limitations  UE support on RW    Hip Flexion  Right;10 reps;Knee bent;AROM    Hip Flexion Limitations  UE support on RW    Hip Abduction  Right;10 reps;Knee straight;AROM    Abduction Limitations  UE support on RW    Hip Extension  Right;10 reps;Knee straight;AAROM;AROM    Extension Limitations  UE support on RW      Knee/Hip Exercises: Seated   Long Arc Quad  Right;10 reps;2 sets;AROM    [employer] Limitations  + hip adduction ball squeeze    Other Seated Knee/Hip Exercises  R Fitter leg press (2 blue) x 15      Knee/Hip Exercises: Supine   Short Arc Quad Sets  Right;10 reps    Straight Leg Raises  Right;AAROM;5 reps;2 sets    Straight Leg Raises Limitations  PT assisted    Knee Extension  Right;20 reps    Knee Extension  Limitations  Quad & glute set into peanut ball under heels    Knee Flexion  Right;AAROM;20 reps    Knee Flexion Limitations  HS curl with heels on peanut ball      Modalities   Modalities  Cryotherapy;Electrical Stimulation      Cryotherapy   Number Minutes Cryotherapy  12 Minutes    Cryotherapy Location  Knee    Type of Cryotherapy  Ice pack      Electrical Stimulation   Electrical Stimulation Location  R knee    Electrical Stimulation Action  TENS    Electrical Stimulation Parameters  SD1, intensity to pt tolerance x 12"    Electrical Stimulation Goals  Pain             PT Education - 01/01/18 1102    Education Details  Info on home TENS unit including recommended settings    Person(s) Educated  Patient    Methods  Explanation;Demonstration;Handout    Comprehension  Verbalized understanding;Returned demonstration;Need further instruction       PT Short Term Goals - 01/01/18 1115      PT SHORT TERM GOAL #1   Title  Independent with initial HEP    Status  On-going      PT SHORT TERM GOAL #2   Title  R knee AROM >/= 5-90 dg     Status  On-going      PT SHORT TERM GOAL #3   Title  Pt will safety ambulate with [place] household and limited community distances to increase community access    Status  On-going        PT Long Term Goals - 01/01/18 1116      PT LONG TERM GOAL #1   Title  Independent with ongoing/advanced HEP    Status  On-going      PT LONG TERM GOAL #2   Title  R knee AROM >/= 3-110 dg     Status  On-going      PT LONG TERM GOAL #3   Title  R hip and knee strength >/= 4+/5 for improved stability    Status  On-going      PT LONG TERM GOAL #4   Title  Pt will ambulate with normal gait pattern with or  w/o LRAD for safe community access    Status  On-going      PT LONG TERM GOAL #5   Title  Pt will negotiate 1 flight of stairs reciprocally with normal step pattern to allow safe access to master bed and bathroom in home    Status  On-going             Plan - 01/01/18 1020    Clinical Impression Statement  Terri Waters reporting pain much better today - able to tolerate gentle progression of ROM and basic strengthening exercises, but still with very limited quad activation and unable to acheive full knee extension in SAQ/LAQ or initiate SLR w/o assistance. Provided cues for normal gait pattern with pt able to imporve gait mechanics after instruction. Treatment concluded with TENS and ice pack to promote continued pain and edema control - pt provided with info for home TENS unit..    Rehab Potential  Good    PT Treatment/Interventions  Patient/family education;Therapeutic exercise;Therapeutic activities;Functional mobility training;Gait training;Stair training;Balance training;Manual techniques;Passive range of motion;Scar mobilization;Taping;Dry needling;Electrical Stimulation;Cryotherapy;Vasopneumatic Device;ADLs/Self Care Home Management    PT Next Visit Plan  TKR protocol - update HEP as tolerated; ROM, quad/HS & hip strengthening, stretching, gait training working toward weaning assistive device, vasopneumatic comppression vs estim/ice for pain/edema    Consulted and Agree with Plan of Care  Patient       Patient will benefit from skilled therapeutic intervention in order to improve the following deficits and impairments:  Pain, Decreased range of motion, Impaired flexibility, Decreased strength, Decreased scar mobility, Decreased mobility, Decreased activity tolerance, Difficulty walking, Abnormal gait, Decreased balance  Visit Diagnosis: Acute pain of right knee  Stiffness of right knee, not elsewhere classified  Muscle weakness (generalized)  Difficulty in walking, not elsewhere classified  Other abnormalities of gait and mobility  Localized edema     Problem List Patient Active Problem List   Diagnosis Date Noted  . 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 Migraine with aura and without status  migrainosus, not intractable 04/09/2017    Percival Spanish, PT, MPT 01/01/2018, 11:35 AM  [facility] [address]  [place], [place], [address] Phone: [phone]   Fax:  [phone]  Name: KAMERON GLAZEBROOK MRN: [id number] Date of Birth: 1955-07-23

## 2018-01-01 NOTE — Patient Instructions (Signed)
TENS UNIT  This is helpful for muscle pain and spasm.   Search and Purchase a TENS 7000 2nd edition at www.tenspros.com or www.amazon.com  (It should be less than $30)     TENS unit instructions:   Do not shower or bathe with the unit on  Turn the unit off before removing electrodes or batteries  If the electrodes lose stickiness add a drop of water to the electrodes after they are disconnected from the unit and place on plastic sheet. If you continued to have difficulty, call the TENS unit company to purchase more electrodes.  Do not apply lotion on the skin area prior to use. Make sure the skin is clean and dry as this will help prolong the life of the electrodes.  After use, always check skin for unusual red areas, rash or other skin difficulties. If there are any skin problems, does not apply electrodes to the same area.  Never remove the electrodes from the unit by pulling the wires.  Do not use the TENS unit or electrodes other than as directed.  Do not change electrode placement without consulting your therapist or physician.  Keep 2 fingers with between each electrode.   TENS stands for Transcutaneous Electrical Nerve Stimulation. In other words, electrical impulses are allowed to pass through the skin in order to excite a nerve.   Purpose and Use of TENS:  TENS is a method used to manage acute and chronic pain without the use of drugs. It has been effective in managing pain associated with surgery, sprains, strains, trauma, rheumatoid arthritis, and neuralgias. It is a non-addictive, low risk, and non-invasive technique used to control pain. It is not, by any means, a curative form of treatment.   How TENS Works:  Most TENS units are a Paramedic unit powered by one 9 volt battery. Attached to the outside of the unit are two lead wires where two pins and/or snaps connect on each wire. All units come with a set of four reusable pads or electrodes. These are placed  on the skin surrounding the area involved. By inserting the leads into  the pads, the electricity can pass from the unit making the circuit complete.  As the intensity is turned up slowly, the electrical current enters the body from the electrodes through the skin to the surrounding nerve fibers. This triggers the release of hormones from within the body. These hormones contain pain relievers. By increasing the circulation of these hormones, the person's pain may be lessened. It is also believed that the electrical stimulation itself helps to block the pain messages being sent to the brain, thus also decreasing the body's perception of pain.   Hazards:  TENS units are NOT to be used by patients with PACEMAKERS, DEFIBRILLATORS, DIABETIC PUMPS, PREGNANT WOMEN, and patients with SEIZURE DISORDERS.  TENS units are NOT to be used over the heart, throat, brain, or spinal cord.  One of the major side effects from the TENS unit may be skin irritation. Some people may develop a rash if they are sensitive to the materials used in the electrodes or the connecting wires.   Wear the unit for 30-40 minutes up to 3-4x/day.   Avoid overuse due the body getting used to the stem making it not as effective over time.

## 2018-01-04 ENCOUNTER — Ambulatory Visit: Payer: 59 | Admitting: Physical Therapy

## 2018-01-04 ENCOUNTER — Encounter: Payer: Self-pay | Admitting: Physical Therapy

## 2018-01-04 DIAGNOSIS — R262 Difficulty in walking, not elsewhere classified: Secondary | ICD-10-CM

## 2018-01-04 DIAGNOSIS — M25561 Pain in right knee: Secondary | ICD-10-CM | POA: Diagnosis not present

## 2018-01-04 DIAGNOSIS — R600 Localized edema: Secondary | ICD-10-CM

## 2018-01-04 DIAGNOSIS — R2689 Other abnormalities of gait and mobility: Secondary | ICD-10-CM

## 2018-01-04 DIAGNOSIS — M25661 Stiffness of right knee, not elsewhere classified: Secondary | ICD-10-CM

## 2018-01-04 DIAGNOSIS — M6281 Muscle weakness (generalized): Secondary | ICD-10-CM

## 2018-01-04 NOTE — Therapy (Signed)
[facility] [address]  [place], [place], [address] Phone: [phone]   Fax:  [phone]  Physical Therapy Treatment  Patient Details  Name: Terri Waters MRN: [id number] Date of Birth: 1956/01/05 Referring Provider (PT): Netta Cedars, MD   Encounter Date: 01/04/2018  PT End of Session - 01/04/18 1050    Visit Number  4    Number of Visits  16    Date for PT Re-Evaluation  02/09/18    Authorization Type  [employer]    Authorization - Number of Visits  48   9 used prior to eval   PT Start Time  1050    PT Stop Time  1148    PT Time Calculation (min)  58 min    Activity Tolerance  Patient tolerated treatment well    Behavior During Therapy  [facility] for tasks assessed/performed       Past Medical History:  Diagnosis Date  . Cancer (HCC)    papillary carcinoma  . Diabetes mellitus without complication ([place])   . High cholesterol   . Hypertension   . Hypothyroidism   . Migraines   . Thyroid disease   . Ventricular tachycardia [facility])     Past Surgical History:  Procedure Laterality Date  . ABDOMINAL HYSTERECTOMY    . BREAST SURGERY     lumps removed - over 20 years ago  . THYROIDECTOMY    . TOTAL KNEE ARTHROPLASTY Right 12/18/2017   Procedure: RIGHT TOTAL KNEE ARTHROPLASTY;  Surgeon: Netta Cedars, MD;  Location: [place];  Service: Orthopedics;  Laterality: Right;    There were no vitals filed for this visit.  Subjective Assessment - 01/04/18 1053    Subjective  Pt reporting her knee feels like it swelled up more yesterday after doing her HEP exercises and still feels tight today. Pain was up to 8/10 yesterday and 6/10 earlier this morning, but down to 4/10 after pain meds and muscle relaxant.    Pertinent History  R TKR 12/18/17    Patient Stated Goals  "to get the soreness out of my knee and walk normally again"    Currently in Pain?  Yes    Pain Score  4     Pain Location  Knee    Pain Orientation  Right     Pain Descriptors / Indicators  Tightness   "stiff & swollen"   Pain Type  Surgical pain;Acute pain    Pain Frequency  Intermittent         [facility] PT Assessment - 01/04/18 1050      AROM   Right Knee Extension  5    Right Knee Flexion  81      PROM   Right Knee Extension  3    Right Knee Flexion  87                   [facility] Adult PT Treatment/Exercise - 01/04/18 1050      Ambulation/Gait   Ambulation/Gait Assistance  6: Modified independent (Device/Increase time)    Ambulation/Gait Assistance Details  Provided cues for proper hand placement of SPC as well as proper sequencing and step pattern with SPC.    Ambulation Distance (Feet)  140 Feet    Assistive device  Straight cane    Gait Pattern  Step-through pattern;Decreased hip/knee flexion - right;Decreased weight shift to right;Decreased stance time - right    Ambulation Surface  Level;Indoor    Gait  Comments  Pt stil demonstrating mild gait deviaitons with SPC but to a lesser degree than with RW - pt cleared to walk [facility]with [facility], but may still use RW as needed for community ambulation.      Self-Care   Self-Care  Other Self-Care Comments    Other Self-Care Comments   R knee patellar mobs in all directions + parallel scar mobilization avoiding direct pressure on incision until all scabbing gone.      Exercises   Exercises  Knee/Hip      Knee/Hip Exercises: Aerobic   Nustep  L3 x 6 min      Knee/Hip Exercises: Standing   Heel Raises  Both;10 reps;5 seconds    Heel Raises Limitations  cues for quad & glute set with heel raises    Hip Flexion  Right;10 reps;Knee straight;AROM    Hip Flexion Limitations  UE support on counter    Terminal Knee Extension  Right;10 reps;Strengthening    Terminal Knee Extension Limitations  ' hold with small ball into wall    Hip Abduction  Right;10 reps;Knee straight;AROM    Abduction Limitations  UE support on counter    Hip Extension  Right;10 reps;Knee  straight;AAROM;AROM    Extension Limitations  UE support on counter    Functional Squat  10 reps;5 seconds    Functional Squat Limitations  counter squat    Other Standing Knee Exercises  R hip/knee march x10      Knee/Hip Exercises: Supine   Quad Sets  Right;15 reps    Quad Sets Limitations  5 sec hold    Heel Slides  Right;10 reps;AROM;AAROM    Heel Slides Limitations  initial AROM with AAROM with strap for added flexion stretch    Straight Leg Raises  Right;AAROM;5 reps;2 sets    Straight Leg Raises Limitations  PT assisted    Patellar Mobs  R knee all directions      Modalities   Modalities  Vasopneumatic      Vasopneumatic   Number Minutes Vasopneumatic   10 minutes    Vasopnuematic Location   Knee   Rt   Vasopneumatic Pressure  Low    Vasopneumatic Temperature   coldest             PT Education - 01/04/18 1140    Education Details  HEP update    Person(s) Educated  Patient    Methods  Explanation;Demonstration;Handout    Comprehension  Verbalized understanding;Returned demonstration       PT Short Term Goals - 01/01/18 1115      PT SHORT TERM GOAL #1   Title  Independent with initial HEP    Status  On-going      PT SHORT TERM GOAL #2   Title  R knee AROM >/= 5-90 dg     Status  On-going      PT SHORT TERM GOAL #3   Title  Pt will safety ambulate with SPC household and limited community distances to increase community access    Status  On-going        PT Long Term Goals - 01/01/18 1116      PT LONG TERM GOAL #1   Title  Independent with ongoing/advanced HEP    Status  On-going      PT LONG TERM GOAL #2   Title  R knee AROM >/= 3-110 dg     Status  On-going      PT LONG TERM GOAL #3  Title  R hip and knee strength >/= 4+/5 for improved stability    Status  On-going      PT LONG TERM GOAL #4   Title  Pt will ambulate with normal gait pattern with or w/o LRAD for safe community access    Status  On-going      PT LONG TERM GOAL #5   Title   Pt will negotiate 1 flight of stairs reciprocally with normal step pattern to allow safe access to master bed and bathroom in home    Status  On-going            Plan - 01/04/18 1057    Clinical Impression Statement  R knee ROM improving - AROM now 5-81 and PROM 3-87.  Pt remains unable to complete SLR w/o assistance but good tolerance for standing SLR and marching hip flexion, therefore HEP updated to include basic standing exercises. Introduced gait with SPC with good tolerance and pt comfort althought pt stil demonstrating mild gait deviaitons with SPC but to a lesser degree than with RW - pt cleared to walk around homewith [facility], but may still use RW as needed for community ambulation. Treatment concluded with vasopneumatic compression to reduce pain/edema.    Rehab Potential  Good    PT Treatment/Interventions  Patient/family education;Therapeutic exercise;Therapeutic activities;Functional mobility training;Gait training;Stair training;Balance training;Manual techniques;Passive range of motion;Scar mobilization;Taping;Dry needling;Electrical Stimulation;Cryotherapy;Vasopneumatic Device;ADLs/Self Care Home Management    PT Next Visit Plan  TKR protocol - update HEP as tolerated; ROM, quad/HS & hip strengthening, stretching, gait training working toward weaning assistive device, vasopneumatic comppression vs estim/ice for pain/edema    Consulted and Agree with Plan of Care  Patient       Patient will benefit from skilled therapeutic intervention in order to improve the following deficits and impairments:  Pain, Decreased range of motion, Impaired flexibility, Decreased strength, Decreased scar mobility, Decreased mobility, Decreased activity tolerance, Difficulty walking, Abnormal gait, Decreased balance  Visit Diagnosis: Acute pain of right knee  Stiffness of right knee, not elsewhere classified  Muscle weakness (generalized)  Difficulty in walking, not elsewhere classified  Other  abnormalities of gait and mobility  Localized edema     Problem List Patient Active Problem List   Diagnosis Date Noted  . 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 Migraine with aura and without status migrainosus, not intractable 04/09/2017    Percival Spanish, PT, MPT 01/04/2018, 12:28 PM  [facility] [address]  [place], [place], [address] Phone: [phone]   Fax:  [phone]  Name: Terri Waters MRN: [id number] Date of Birth: 02/19/1955

## 2018-01-05 ENCOUNTER — Ambulatory Visit: Payer: 59 | Admitting: Physical Therapy

## 2018-01-05 ENCOUNTER — Encounter: Payer: Self-pay | Admitting: Physical Therapy

## 2018-01-05 DIAGNOSIS — M25561 Pain in right knee: Secondary | ICD-10-CM

## 2018-01-05 DIAGNOSIS — M6281 Muscle weakness (generalized): Secondary | ICD-10-CM

## 2018-01-05 DIAGNOSIS — M25661 Stiffness of right knee, not elsewhere classified: Secondary | ICD-10-CM

## 2018-01-05 DIAGNOSIS — R2689 Other abnormalities of gait and mobility: Secondary | ICD-10-CM

## 2018-01-05 DIAGNOSIS — R262 Difficulty in walking, not elsewhere classified: Secondary | ICD-10-CM

## 2018-01-05 DIAGNOSIS — R600 Localized edema: Secondary | ICD-10-CM

## 2018-01-05 NOTE — Therapy (Signed)
[facility] [address]  [place], [place], [address] Phone: [phone]   Fax:  [phone]  Physical Therapy Treatment  Patient Details  Name: Terri Waters MRN: [id number] Date of Birth: 01-01-1956 Referring Provider (PT): Netta Cedars, MD   Encounter Date: 01/05/2018  PT End of Session - 01/05/18 0800    Visit Number  5    Number of Visits  16    Date for PT Re-Evaluation  02/09/18    Authorization Type  [employer]    Authorization - Number of Visits  48   9 used prior to eval   PT Start Time  0800    PT Stop Time  0853    PT Time Calculation (min)  53 min    Activity Tolerance  Patient tolerated treatment well    Behavior During Therapy  [facility] for tasks assessed/performed       Past Medical History:  Diagnosis Date  . Cancer (HCC)    papillary carcinoma  . Diabetes mellitus without complication ([place])   . High cholesterol   . Hypertension   . Hypothyroidism   . Migraines   . Thyroid disease   . Ventricular tachycardia [facility])     Past Surgical History:  Procedure Laterality Date  . ABDOMINAL HYSTERECTOMY    . BREAST SURGERY     lumps removed - over 20 years ago  . THYROIDECTOMY    . TOTAL KNEE ARTHROPLASTY Right 12/18/2017   Procedure: RIGHT TOTAL KNEE ARTHROPLASTY;  Surgeon: Netta Cedars, MD;  Location: [place];  Service: Orthopedics;  Laterality: Right;    There were no vitals filed for this visit.  Subjective Assessment - 01/05/18 0801    Subjective  Pt reporting pain was not as bad this morning.    Pertinent History  R TKR 12/18/17    Patient Stated Goals  "to get the soreness out of my knee and walk normally again"    Currently in Pain?  Yes    Pain Score  3     Pain Location  Knee    Pain Orientation  Right    Pain Descriptors / Indicators  Tightness    Pain Type  Surgical pain;Acute pain    Pain Frequency  Intermittent                       [facility] Adult PT  Treatment/Exercise - 01/05/18 0800      Exercises   Exercises  Knee/Hip      Knee/Hip Exercises: Aerobic   Nustep  L3 x 6 min      Knee/Hip Exercises: Standing   Lateral Step Up  Right;10 reps;Step Height: 4";Hand Hold: 2    Lateral Step Up Limitations  cues for hip & knee flexion to place foot on step, rather than hip circumduction into extension    Forward Step Up  Right;10 reps;Step Height: 4";Hand Hold: 2    Forward Step Up Limitations  SPC & counter support      Knee/Hip Exercises: Seated   Long Arc Quad  Right;10 reps;2 sets;Strengthening    Long Arc Quad Weight  1 lbs.    Long [employer] Limitations  + hip adduction ball squeeze    Clamshell with TheraBand  Red   alt hip ABD/ER with red TB   Other Seated Knee/Hip Exercises  R Fitter leg press (1 black) 2 x 10    Hamstring Curl  Right;20 reps;Strengthening  Hamstring Limitations  red TB      Knee/Hip Exercises: Supine   Short Arc Quad Sets  Right;10 reps;2 sets;AROM    Short Arc Quad Sets Limitations  8" foam roll    Bridges  Both;10 reps;Strengthening    Bridges Limitations  straight leg bridge with heels on peanut ball    Straight Leg Raises  Right;AAROM;AROM;10 reps    Straight Leg Raises Limitations  pt better able to initiate lift with PT only guarding to ensure good control    Knee Extension  Right;20 reps   5" hold   Knee Extension Limitations  Quad & glute set into peanut ball under heels    Knee Flexion  Right;AAROM;20 reps    Knee Flexion Limitations  HS curl with heels on peanut ball               PT Short Term Goals - 01/05/18 0802      PT SHORT TERM GOAL #1   Title  Independent with initial HEP    Status  Achieved      PT SHORT TERM GOAL #2   Title  R knee AROM >/= 5-90 dg     Status  On-going      PT SHORT TERM GOAL #3   Title  Pt will safety ambulate with SPC household and limited community distances to increase community access    Status  Partially Met        PT Long Term Goals -  01/01/18 1116      PT LONG TERM GOAL #1   Title  Independent with ongoing/advanced HEP    Status  On-going      PT LONG TERM GOAL #2   Title  R knee AROM >/= 3-110 dg     Status  On-going      PT LONG TERM GOAL #3   Title  R hip and knee strength >/= 4+/5 for improved stability    Status  On-going      PT LONG TERM GOAL #4   Title  Pt will ambulate with normal gait pattern with or w/o LRAD for safe community access    Status  On-going      PT LONG TERM GOAL #5   Title  Pt will negotiate 1 flight of stairs reciprocally with normal step pattern to allow safe access to master bed and bathroom in home    Status  On-going            Plan - 01/05/18 0803    Clinical Impression Statement  Terri Waters reporting she is feeling comfortable walking with the cane in the hosue - actually feels more balanced with the cane than the RW. Pt encouraged to slowly attempt cane in community as she feels comfortable. Good tolerance for exercise progression with increased resistance and reps as well as introduction of forward and lateral step-ups, with pt now able to initiate SLR w/o PT assistance. Pt progressing well toward goals - STG's # 1 now met and #3 partially met.    Rehab Potential  Good    PT Treatment/Interventions  Patient/family education;Therapeutic exercise;Therapeutic activities;Functional mobility training;Gait training;Stair training;Balance training;Manual techniques;Passive range of motion;Scar mobilization;Taping;Dry needling;Electrical Stimulation;Cryotherapy;Vasopneumatic Device;ADLs/Self Care Home Management    PT Next Visit Plan  TKR protocol - update HEP as tolerated; ROM, quad/HS & hip strengthening, stretching, gait training working toward weaning assistive device, vasopneumatic comppression vs estim/ice for pain/edema    Consulted and Agree with Plan of Care  Patient  Patient will benefit from skilled therapeutic intervention in order to improve the following deficits and  impairments:  Pain, Decreased range of motion, Impaired flexibility, Decreased strength, Decreased scar mobility, Decreased mobility, Decreased activity tolerance, Difficulty walking, Abnormal gait, Decreased balance  Visit Diagnosis: Acute pain of right knee  Stiffness of right knee, not elsewhere classified  Muscle weakness (generalized)  Difficulty in walking, not elsewhere classified  Other abnormalities of gait and mobility  Localized edema     Problem List Patient Active Problem List   Diagnosis Date Noted  . 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 Migraine with aura and without status migrainosus, not intractable 04/09/2017    Percival Spanish, PT, MPT 01/05/2018, 8:48 AM  [facility] [address]  [place], [place], [address] Phone: [phone]   Fax:  [phone]  Name: Terri Waters MRN: [id number] Date of Birth: 1955-06-17

## 2018-01-08 ENCOUNTER — Ambulatory Visit: Payer: 59

## 2018-01-08 DIAGNOSIS — R262 Difficulty in walking, not elsewhere classified: Secondary | ICD-10-CM

## 2018-01-08 DIAGNOSIS — M25661 Stiffness of right knee, not elsewhere classified: Secondary | ICD-10-CM

## 2018-01-08 DIAGNOSIS — M25561 Pain in right knee: Secondary | ICD-10-CM

## 2018-01-08 DIAGNOSIS — M6281 Muscle weakness (generalized): Secondary | ICD-10-CM

## 2018-01-08 DIAGNOSIS — R600 Localized edema: Secondary | ICD-10-CM

## 2018-01-08 DIAGNOSIS — R2689 Other abnormalities of gait and mobility: Secondary | ICD-10-CM

## 2018-01-08 NOTE — Therapy (Signed)
[facility] [address]  [place], [place], [address] Phone: [phone]   Fax:  [phone]  Physical Therapy Treatment  Patient Details  Name: Terri Waters MRN: [id number] Date of Birth: [birth year] Referring Provider (PT): Netta Cedars, MD   Encounter Date: 01/08/2018  PT End of Session - 01/08/18 1106    Visit Number  6    Number of Visits  16    Date for PT Re-Evaluation  02/09/18    Authorization Type  UHC    Authorization - Number of Visits  48   9 used prior to eval   PT Start Time  1101    PT Stop Time  1155    PT Time Calculation (min)  54 min    Activity Tolerance  Patient tolerated treatment well    Behavior During Therapy  [facility] for tasks assessed/performed       Past Medical History:  Diagnosis Date  . Cancer (HCC)    papillary carcinoma  . Diabetes mellitus without complication ([place])   . High cholesterol   . Hypertension   . Hypothyroidism   . Migraines   . Thyroid disease   . Ventricular tachycardia [facility])     Past Surgical History:  Procedure Laterality Date  . ABDOMINAL HYSTERECTOMY    . BREAST SURGERY     lumps removed - over 20 years ago  . THYROIDECTOMY    . TOTAL KNEE ARTHROPLASTY Right 12/18/2017   Procedure: RIGHT TOTAL KNEE ARTHROPLASTY;  Surgeon: Netta Cedars, MD;  Location: [place];  Service: Orthopedics;  Laterality: Right;    There were no vitals filed for this visit.  Subjective Assessment - 01/08/18 1104    Subjective  Reports no issues with updated HEP.      Pertinent History  R TKR 12/18/17    Patient Stated Goals  "to get the soreness out of my knee and walk normally again"    Currently in Pain?  Yes    Pain Score  4     Pain Location  Knee    Pain Orientation  Right    Pain Descriptors / Indicators  Tightness    Pain Type  Surgical pain;Acute pain    Pain Onset  1 to 4 weeks ago    Pain Frequency  Intermittent    Aggravating Factors   Prolonged walking     Pain Relieving Factors  ice    Multiple Pain Sites  No                       [facility] Adult PT Treatment/Exercise - 01/08/18 1114      Knee/Hip Exercises: Aerobic   Nustep  L3 x 7 min (LE/UE)      Knee/Hip Exercises: Standing   Heel Raises  Both;15 reps;5 seconds    Heel Raises Limitations  cues for quad set     Terminal Knee Extension  Right;15 reps;Theraband    Theraband Level (Terminal Knee Extension)  Level 3 (Green)    Terminal Knee Extension Limitations  Cues for alignment    Cues for reduction in substitutions     Knee/Hip Exercises: Seated   Long Arc Quad  Right;10 reps    Long Arc Quad Weight  1 lbs.    Sit to Sand  without UE support;10 reps   Cues required for proper weight shift      Knee/Hip Exercises: Supine   Short Arc [employer]  Right;10 reps    Short Arc Quad Sets Limitations  8" foam roll under knee; 1# at ankle    Straight Leg Raises  Right;10 reps      Knee/Hip Exercises: Sidelying   Hip ABduction  Right;10 reps      Vasopneumatic   Number Minutes Vasopneumatic   10 minutes    Vasopnuematic Location   Knee    Vasopneumatic Pressure  Low    Vasopneumatic Temperature   coldest      Manual Therapy   Manual Therapy  Joint mobilization    Manual therapy comments  supine     Joint Mobilization  R patellar mobs all directions (mod limited mobility) for ROM                PT Short Term Goals - 01/05/18 0802      PT SHORT TERM GOAL #1   Title  Independent with initial HEP    Status  Achieved      PT SHORT TERM GOAL #2   Title  R knee AROM >/= 5-90 dg     Status  On-going      PT SHORT TERM GOAL #3   Title  Pt will safety ambulate with SPC household and limited community distances to increase community access    Status  Partially Met        PT Long Term Goals - 01/01/18 1116      PT LONG TERM GOAL #1   Title  Independent with ongoing/advanced HEP    Status  On-going      PT LONG TERM GOAL #2   Title  R knee AROM >/=  3-110 dg     Status  On-going      PT LONG TERM GOAL #3   Title  R hip and knee strength >/= 4+/5 for improved stability    Status  On-going      PT LONG TERM GOAL #4   Title  Pt will ambulate with normal gait pattern with or w/o LRAD for safe community access    Status  On-going      PT LONG TERM GOAL #5   Title  Pt will negotiate 1 flight of stairs reciprocally with normal step pattern to allow safe access to master bed and bathroom in home    Status  On-going            Plan - 01/08/18 1108    Clinical Impression Statement  Pt. tolerated addition of sit<>stand, progression of SLR, and SAQ well today.  Required cueing for proper wt. shift today with sit<>stand and quad set with SLR to ensure proper quad activation.  SLR now with ~ 2 dg quad lag much improved.  Ended visit with ice/compression to R knee to reduce post-exercise swelling and pain.      Rehab Potential  Good    PT Frequency  3x / week    PT Duration  6 weeks    PT Treatment/Interventions  Patient/family education;Therapeutic exercise;Therapeutic activities;Functional mobility training;Gait training;Stair training;Balance training;Manual techniques;Passive range of motion;Scar mobilization;Taping;Dry needling;Electrical Stimulation;Cryotherapy;Vasopneumatic Device;ADLs/Self Care Home Management    PT Next Visit Plan  TKR protocol - update HEP as tolerated; ROM, quad/HS & hip strengthening, stretching, gait training working toward weaning assistive device, vasopneumatic comppression vs estim/ice for pain/edema    Consulted and Agree with Plan of Care  Patient       Patient will benefit from skilled therapeutic intervention in order to improve the following deficits  and impairments:  Pain, Decreased range of motion, Impaired flexibility, Decreased strength, Decreased scar mobility, Decreased mobility, Decreased activity tolerance, Difficulty walking, Abnormal gait, Decreased balance  Visit Diagnosis: Acute pain of  right knee  Stiffness of right knee, not elsewhere classified  Muscle weakness (generalized)  Difficulty in walking, not elsewhere classified  Other abnormalities of gait and mobility  Localized edema     Problem List Patient Active Problem List   Diagnosis Date Noted  . 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 Migraine with aura and without status migrainosus, not intractable 04/09/2017    Bess Harvest, PTA 01/08/18 12:01 PM   [facility] [address]  [place], [place], [address] Phone: [phone]   Fax:  [phone]  Name: Terri Waters MRN: [id number] Date of Birth: November 08, 1955

## 2018-01-11 ENCOUNTER — Ambulatory Visit: Payer: 59

## 2018-01-11 DIAGNOSIS — R262 Difficulty in walking, not elsewhere classified: Secondary | ICD-10-CM

## 2018-01-11 DIAGNOSIS — M25561 Pain in right knee: Secondary | ICD-10-CM | POA: Diagnosis not present

## 2018-01-11 DIAGNOSIS — M25661 Stiffness of right knee, not elsewhere classified: Secondary | ICD-10-CM

## 2018-01-11 DIAGNOSIS — M6281 Muscle weakness (generalized): Secondary | ICD-10-CM

## 2018-01-11 DIAGNOSIS — R600 Localized edema: Secondary | ICD-10-CM

## 2018-01-11 DIAGNOSIS — R2689 Other abnormalities of gait and mobility: Secondary | ICD-10-CM

## 2018-01-11 NOTE — Therapy (Signed)
[facility] [address]  [place], [place], [address] Phone: [phone]   Fax:  [phone]  Physical Therapy Treatment  Patient Details  Name: Terri Waters MRN: [id number] Date of Birth: 10-02-1955 Referring Provider (PT): Netta Cedars, MD   Encounter Date: 01/11/2018  PT End of Session - 01/11/18 0802    Visit Number  7    Number of Visits  16    Date for PT Re-Evaluation  02/09/18    Authorization Type  UHC    Authorization - Number of Visits  48   9 used prior to eval   PT Start Time  0800    PT Stop Time  0850    PT Time Calculation (min)  50 min    Activity Tolerance  Patient tolerated treatment well    Behavior During Therapy  [facility] for tasks assessed/performed       Past Medical History:  Diagnosis Date  . Cancer (HCC)    papillary carcinoma  . Diabetes mellitus without complication ([place])   . High cholesterol   . Hypertension   . Hypothyroidism   . Migraines   . Thyroid disease   . Ventricular tachycardia [facility])     Past Surgical History:  Procedure Laterality Date  . ABDOMINAL HYSTERECTOMY    . BREAST SURGERY     lumps removed - over 20 years ago  . THYROIDECTOMY    . TOTAL KNEE ARTHROPLASTY Right 12/18/2017   Procedure: RIGHT TOTAL KNEE ARTHROPLASTY;  Surgeon: Netta Cedars, MD;  Location: [place];  Service: Orthopedics;  Laterality: Right;    There were no vitals filed for this visit.  Subjective Assessment - 01/11/18 0801    Subjective  Pt. reporting increased pain on Saturday.      Pertinent History  R TKR 12/18/17    Patient Stated Goals  "to get the soreness out of my knee and walk normally again"    Currently in Pain?  Yes    Pain Score  4     Pain Location  Knee    Pain Orientation  Right    Pain Descriptors / Indicators  Tightness    Pain Type  Surgical pain;Acute pain    Multiple Pain Sites  No                       [facility] Adult PT Treatment/Exercise -  01/11/18 0810      Ambulation/Gait   Ambulation/Gait  Yes    Ambulation/Gait Assistance  6: Modified independent (Device/Increase time)    Ambulation/Gait Assistance Details  Provided cues for upright posture, even stance time, and proper wt. shift over R LE    Ambulation Distance (Feet)  100 Feet    Assistive device  Straight cane    Gait Pattern  Step-through pattern;Decreased hip/knee flexion - right;Decreased weight shift to right;Decreased stance time - right    Ambulation Surface  Level;Indoor      Knee/Hip Exercises: Stretches   Passive Hamstring Stretch  Right;2 reps;30 seconds    Passive Hamstring Stretch Limitations  supine with strap     Hip Flexor Stretch  Right;1 rep;60 seconds    Hip Flexor Stretch Limitations  mod thomas position with strap     Gastroc Stretch  Right;2 reps;30 seconds    Gastroc Stretch Limitations  supine with strap       Knee/Hip Exercises: Aerobic   Nustep  L3 x 6 min (LE/UE)  Knee/Hip Exercises: Machines for Strengthening   Cybex Knee Flexion  --      Knee/Hip Exercises: Standing   Heel Raises  Both;15 reps    Heel Raises Limitations  cues for quad set     Terminal Knee Extension  Right;10 reps    Theraband Level (Terminal Knee Extension)  Level 4 (Blue)    Terminal Knee Extension Limitations  TKE     Forward Step Up  Right;10 reps;Hand Hold: 1;Step Height: 6"   Cues for R LE effort and to reduce L UE use on machine    Forward Step Up Limitations  1 UE support on counter     Wall Squat  10 reps;3 seconds    Wall Squat Limitations  therapist guarding knees    Cues required for R wt. shift      Knee/Hip Exercises: Seated   Hamstring Curl  Right;20 reps;Strengthening    Hamstring Limitations  red TB    Sit to Sand  without UE support   x 12 reps      Knee/Hip Exercises: Supine   Straight Leg Raises  Right;15 reps    Straight Leg Raises Limitations  Verbal cues provided for quad set before each rep      Vasopneumatic   Number  Minutes Vasopneumatic   10 minutes    Vasopnuematic Location   Knee    Vasopneumatic Pressure  Medium    Vasopneumatic Temperature   coldest      Manual Therapy   Manual Therapy  Soft tissue mobilization    Manual therapy comments  seated    Soft tissue mobilization  STM to R medial HS and gastroc in area of reported tightness/tenderness               PT Short Term Goals - 01/05/18 0802      PT SHORT TERM GOAL #1   Title  Independent with initial HEP    Status  Achieved      PT SHORT TERM GOAL #2   Title  R knee AROM >/= 5-90 dg     Status  On-going      PT SHORT TERM GOAL #3   Title  Pt will safety ambulate with SPC household and limited community distances to increase community access    Status  Partially Met        PT Long Term Goals - 01/01/18 1116      PT LONG TERM GOAL #1   Title  Independent with ongoing/advanced HEP    Status  On-going      PT LONG TERM GOAL #2   Title  R knee AROM >/= 3-110 dg     Status  On-going      PT LONG TERM GOAL #3   Title  R hip and knee strength >/= 4+/5 for improved stability    Status  On-going      PT LONG TERM GOAL #4   Title  Pt will ambulate with normal gait pattern with or w/o LRAD for safe community access    Status  On-going      PT LONG TERM GOAL #5   Title  Pt will negotiate 1 flight of stairs reciprocally with normal step pattern to allow safe access to master bed and bathroom in home    Status  On-going            Plan - 01/11/18 0802    Clinical Impression Statement  Issa seen today ambulating with  R knee in sustained bent positioning.  Session focused on gait training and therex to promote TKE and quad/HS strengthening.  Pt. tolerated all activities well today and able to progress to 6" forward step-up without issue.  Does still require cueing for R wt. shift with most standing activities.  Ended visit with ice/compression to R knee to reduce post-exercise pain and swelling.  Progressing well toward  goals.      Rehab Potential  Good    PT Frequency  --    PT Duration  --    PT Treatment/Interventions  Patient/family education;Therapeutic exercise;Therapeutic activities;Functional mobility training;Gait training;Stair training;Balance training;Manual techniques;Passive range of motion;Scar mobilization;Taping;Dry needling;Electrical Stimulation;Cryotherapy;Vasopneumatic Device;ADLs/Self Care Home Management    PT Next Visit Plan  TKR protocol - update HEP as tolerated; ROM, quad/HS & hip strengthening, stretching, gait training working toward weaning assistive device, vasopneumatic comppression vs estim/ice for pain/edema    Consulted and Agree with Plan of Care  Patient       Patient will benefit from skilled therapeutic intervention in order to improve the following deficits and impairments:  Pain, Decreased range of motion, Impaired flexibility, Decreased strength, Decreased scar mobility, Decreased mobility, Decreased activity tolerance, Difficulty walking, Abnormal gait, Decreased balance  Visit Diagnosis: Acute pain of right knee  Stiffness of right knee, not elsewhere classified  Muscle weakness (generalized)  Difficulty in walking, not elsewhere classified  Other abnormalities of gait and mobility  Localized edema     Problem List Patient Active Problem List   Diagnosis Date Noted  . 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 Migraine with aura and without status migrainosus, not intractable 04/09/2017    Bess Harvest, PTA 01/11/18 11:57 AM   [facility] [address]  [place], [place], [address] Phone: [phone]   Fax:  [phone]  Name: LAKINDRA WIBLE MRN: [id number] Date of Birth: 1955/09/28

## 2018-01-14 ENCOUNTER — Ambulatory Visit: Payer: 59 | Attending: Orthopedic Surgery

## 2018-01-14 DIAGNOSIS — R2689 Other abnormalities of gait and mobility: Secondary | ICD-10-CM | POA: Diagnosis present

## 2018-01-14 DIAGNOSIS — M25661 Stiffness of right knee, not elsewhere classified: Secondary | ICD-10-CM | POA: Insufficient documentation

## 2018-01-14 DIAGNOSIS — R262 Difficulty in walking, not elsewhere classified: Secondary | ICD-10-CM | POA: Diagnosis present

## 2018-01-14 DIAGNOSIS — M25561 Pain in right knee: Secondary | ICD-10-CM

## 2018-01-14 DIAGNOSIS — R600 Localized edema: Secondary | ICD-10-CM | POA: Diagnosis present

## 2018-01-14 DIAGNOSIS — M6281 Muscle weakness (generalized): Secondary | ICD-10-CM | POA: Diagnosis present

## 2018-01-14 NOTE — Therapy (Signed)
[facility] [address]  [place], [place], [address] Phone: [phone]   Fax:  [phone]  Physical Therapy Treatment  Patient Details  Name: Terri Waters MRN: [id number] Date of Birth: 1955/11/09 Referring Provider (PT): Netta Cedars, MD   Encounter Date: 01/14/2018  PT End of Session - 01/14/18 0903    Visit Number  8    Number of Visits  16    Date for PT Re-Evaluation  02/09/18    Authorization Type  UHC    Authorization - Number of Visits  48   9 used prior to eval   PT Start Time  0850    PT Stop Time  0940    PT Time Calculation (min)  50 min    Activity Tolerance  Patient tolerated treatment well    Behavior During Therapy  [facility] for tasks assessed/performed       Past Medical History:  Diagnosis Date  . Cancer (HCC)    papillary carcinoma  . Diabetes mellitus without complication ([place])   . High cholesterol   . Hypertension   . Hypothyroidism   . Migraines   . Thyroid disease   . Ventricular tachycardia [facility])     Past Surgical History:  Procedure Laterality Date  . ABDOMINAL HYSTERECTOMY    . BREAST SURGERY     lumps removed - over 20 years ago  . THYROIDECTOMY    . TOTAL KNEE ARTHROPLASTY Right 12/18/2017   Procedure: RIGHT TOTAL KNEE ARTHROPLASTY;  Surgeon: Netta Cedars, MD;  Location: [place];  Service: Orthopedics;  Laterality: Right;    There were no vitals filed for this visit.  Subjective Assessment - 01/14/18 0857    Subjective  Pt. reporting she has been trying to force knee to bend with squatting activity at home.      Patient Stated Goals  "to get the soreness out of my knee and walk normally again"    Currently in Pain?  No/denies    Pain Score  0-No pain   8-9/10 at worst with flexion activities   Multiple Pain Sites  No         [facility] PT Assessment - 01/14/18 0001      AROM   Right Knee Extension  3   supine with heel prop   Right Knee Flexion  88   supine       PROM   Right Knee Extension  2    Right Knee Flexion  91                   [facility] Adult PT Treatment/Exercise - 01/14/18 0940      Ambulation/Gait   Ambulation/Gait  Yes    Ambulation/Gait Assistance  6: Modified independent (Device/Increase time)    Ambulation/Gait Assistance Details  Required cueing for upright posture, B heel strike and even stance time; improved carryover today    Ambulation Distance (Feet)  200 Feet    Assistive device  Straight cane    Gait Pattern  Step-through pattern;Decreased hip/knee flexion - right;Decreased weight shift to right;Decreased stance time - right    Ambulation Surface  Level;Indoor      Knee/Hip Exercises: Stretches   Passive Hamstring Stretch  Right;1 rep;30 seconds    Passive Hamstring Stretch Limitations  strap     Knee: Self-Stretch to increase Flexion  --   5" x 10 reps leaning into treadmill side board    Press photographer  Right;1 rep;30 seconds    Gastroc Stretch Limitations  leaning into wall     Other Knee/Hip Stretches  Seated knee extension stretch x 1 min with heel prop on chair       Knee/Hip Exercises: Aerobic   Recumbent Bike  half revolutions - for ROM       Knee/Hip Exercises: Standing   Functional Squat  15 reps;3 seconds    Functional Squat Limitations  chair       Vasopneumatic   Number Minutes Vasopneumatic   10 minutes    Vasopnuematic Location   Knee    Vasopneumatic Pressure  Medium    Vasopneumatic Temperature   coldest      Manual Therapy   Manual Therapy  Joint mobilization;Passive ROM    Manual therapy comments  supine     Joint Mobilization  R patellar mobs all directions (mod limitation), R knee A/P mobs for improved ROM     Soft tissue mobilization  STM to superior 2" of incision with Free-up lotion with instruction on scar massage at home for pt. and instructed pt. to avoid scar massage over area of incision not yet healed    Passive ROM  R knee flexion/exension stretching  with therapist               PT Education - 01/14/18 1248    Education Details  HEP update     Person(s) Educated  Patient    Methods  Explanation;Demonstration;Verbal cues;Handout    Comprehension  Verbalized understanding;Returned demonstration;Verbal cues required;Need further instruction       PT Short Term Goals - 01/14/18 0935      PT SHORT TERM GOAL #1   Title  Independent with initial HEP    Status  Achieved      PT SHORT TERM GOAL #2   Title  R knee AROM >/= 5-90 dg     Status  Partially Met   3-88 AROM taken on 1.2.19     PT SHORT TERM GOAL #3   Title  Pt will safety ambulate with [place] household and limited community distances to increase community access    Status  Achieved        PT Long Term Goals - 01/01/18 1116      PT LONG TERM GOAL #1   Title  Independent with ongoing/advanced HEP    Status  On-going      PT LONG TERM GOAL #2   Title  R knee AROM >/= 3-110 dg     Status  On-going      PT LONG TERM GOAL #3   Title  R hip and knee strength >/= 4+/5 for improved stability    Status  On-going      PT LONG TERM GOAL #4   Title  Pt will ambulate with normal gait pattern with or w/o LRAD for safe community access    Status  On-going      PT LONG TERM GOAL #5   Title  Pt will negotiate 1 flight of stairs reciprocally with normal step pattern to allow safe access to master bed and bathroom in home    Status  On-going            Plan - 01/14/18 0916    Clinical Impression Statement  Terri Waters making progress with therapy.  Able to partially meet STG# 2 today with R knee AROM 3-88 dg, PROM 2-91 dg.  Now reports ability to ambulate community distances of ~  250-300 ft with SPC and able to ambulate safely in session today achieving STG#3.  Tolerated session focused on flexion/extension ROM activities well.  Manual therapy with R knee A/P mobs, patellar mobs, and LE stretching for hopeful improvement in ROM.  HEP updated with additional LE stretching and prone quad  stretching for continue progression of ROM at home.  Ended visit with ice/compression to R knee to reduce post-exercise swelling and pain.  Will monitor response to update HEP and review technique prn in coming visits.      PT Treatment/Interventions  Patient/family education;Therapeutic exercise;Therapeutic activities;Functional mobility training;Gait training;Stair training;Balance training;Manual techniques;Passive range of motion;Scar mobilization;Taping;Dry needling;Electrical Stimulation;Cryotherapy;Vasopneumatic Device;ADLs/Self Care Home Management    PT Next Visit Plan  TKR protocol - update HEP as tolerated; ROM, quad/HS & hip strengthening, stretching, gait training working toward weaning assistive device, vasopneumatic comppression vs estim/ice for pain/edema    Consulted and Agree with Plan of Care  Patient       Patient will benefit from skilled therapeutic intervention in order to improve the following deficits and impairments:  Pain, Decreased range of motion, Impaired flexibility, Decreased strength, Decreased scar mobility, Decreased mobility, Decreased activity tolerance, Difficulty walking, Abnormal gait, Decreased balance  Visit Diagnosis: Acute pain of right knee  Stiffness of right knee, not elsewhere classified  Muscle weakness (generalized)  Difficulty in walking, not elsewhere classified  Other abnormalities of gait and mobility  Localized edema     Problem List Patient Active Problem List   Diagnosis Date Noted  . 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 Migraine with aura and without status migrainosus, not intractable 04/09/2017    Bess Harvest, PTA 01/14/18 12:48 PM   [facility] [address]  [place], [place], [address] Phone: [phone]   Fax:  [phone]  Name: Terri Waters MRN: [id number] Date of Birth: 06-14-1955

## 2018-01-15 ENCOUNTER — Ambulatory Visit: Payer: 59

## 2018-01-15 DIAGNOSIS — R262 Difficulty in walking, not elsewhere classified: Secondary | ICD-10-CM

## 2018-01-15 DIAGNOSIS — M6281 Muscle weakness (generalized): Secondary | ICD-10-CM

## 2018-01-15 DIAGNOSIS — M25561 Pain in right knee: Secondary | ICD-10-CM

## 2018-01-15 DIAGNOSIS — R600 Localized edema: Secondary | ICD-10-CM

## 2018-01-15 DIAGNOSIS — R2689 Other abnormalities of gait and mobility: Secondary | ICD-10-CM

## 2018-01-15 DIAGNOSIS — M25661 Stiffness of right knee, not elsewhere classified: Secondary | ICD-10-CM

## 2018-01-15 NOTE — Therapy (Signed)
[facility] [address]  [place], [place], [address] Phone: [phone]   Fax:  [phone]  Physical Therapy Treatment  Patient Details  Name: Terri Waters MRN: [id number] Date of Birth: October 05, 1955 Referring Provider (PT): Netta Cedars, MD   Encounter Date: 01/15/2018  PT End of Session - 01/15/18 0807    Visit Number  9    Number of Visits  16    Date for PT Re-Evaluation  02/09/18    Authorization Type  UHC    Authorization - Number of Visits  48   9 used prior to eval   PT Start Time  0801    PT Stop Time  0858   ended visit with 12 min ice/compression to R knee   PT Time Calculation (min)  57 min    Activity Tolerance  Patient tolerated treatment well    Behavior During Therapy  [facility] for tasks assessed/performed       Past Medical History:  Diagnosis Date  . Cancer (HCC)    papillary carcinoma  . Diabetes mellitus without complication ([place])   . High cholesterol   . Hypertension   . Hypothyroidism   . Migraines   . Thyroid disease   . Ventricular tachycardia [facility])     Past Surgical History:  Procedure Laterality Date  . ABDOMINAL HYSTERECTOMY    . BREAST SURGERY     lumps removed - over 20 years ago  . THYROIDECTOMY    . TOTAL KNEE ARTHROPLASTY Right 12/18/2017   Procedure: RIGHT TOTAL KNEE ARTHROPLASTY;  Surgeon: Netta Cedars, MD;  Location: [place];  Service: Orthopedics;  Laterality: Right;    There were no vitals filed for this visit.  Subjective Assessment - 01/15/18 0806    Subjective  Pt. reporting she felt so good after yesterday's visit that she was able to perform updated HEP.  Denies questions with this.      Pertinent History  R TKR 12/18/17    Patient Stated Goals  "to get the soreness out of my knee and walk normally again"    Currently in Pain?  No/denies    Pain Score  0-No pain   up to 5-6/10 with forced flexion    Pain Location  Knee    Pain Orientation  Right    Pain  Descriptors / Indicators  Tightness    Pain Type  Surgical pain;Acute pain    Pain Onset  1 to 4 weeks ago    Pain Frequency  Intermittent    Aggravating Factors   forced flexion     Pain Relieving Factors  ice    Multiple Pain Sites  No         [facility] PT Assessment - 01/15/18 0001      AROM   Right Knee Extension  3   supine with heel on bolster; 8 dg seated with LAQ   Right Knee Flexion  91   seated                   [facility] Adult PT Treatment/Exercise - 01/15/18 0813      Knee/Hip Exercises: Stretches   Passive Hamstring Stretch  Right;1 rep;30 seconds    Passive Hamstring Stretch Limitations  strap     Quad Stretch  Right;1 rep;30 seconds    Gastroc Stretch  Right;1 rep;30 seconds    Gastroc Stretch Limitations  leaning into wall       Knee/Hip Exercises:  Aerobic   Recumbent Bike  half revolutions - for ROM       Knee/Hip Exercises: Standing   Hip Flexion  Right;Left;10 reps;Knee straight;Stengthening    Hip Flexion Limitations  yellow looped TB at ankles; counter     Hip Abduction  Right;Left;10 reps;Knee straight;Stengthening   Cues required for upright posture   Abduction Limitations  yellow looped at ankles; countre     Hip Extension  Right;Left;10 reps;Knee straight;Stengthening   Cues required for upright posture   Extension Limitations  yellow looped TB at ankles; counter     Other Standing Knee Exercises  R toe-clears 1# at ankle to 8" step x 15 reps'' light UE support on counter       Knee/Hip Exercises: Seated   Long Arc Quad  Right;15 reps    Long Arc Quad Weight  1 lbs.      Knee/Hip Exercises: Supine   Bridges  Both;10 reps    Straight Leg Raises  Right;10 reps    Straight Leg Raises Limitations  1#; Cues for quad set prior to movement       Vasopneumatic   Number Minutes Vasopneumatic   12 minutes   terminated after 12 min per pt. request    Vasopnuematic Location   Knee    Vasopneumatic Pressure  Medium    Vasopneumatic Temperature    coldest      Manual Therapy   Manual Therapy  Joint mobilization;Passive ROM    Manual therapy comments  supine     Joint Mobilization  R knee A/P mobs for improved ROM                PT Short Term Goals - 01/15/18 1203      PT SHORT TERM GOAL #1   Title  Independent with initial HEP    Status  Achieved      PT SHORT TERM GOAL #2   Title  R knee AROM >/= 5-90 dg     Status  Achieved      PT SHORT TERM GOAL #3   Title  Pt will safety ambulate with SPC household and limited community distances to increase community access    Status  Achieved        PT Long Term Goals - 01/01/18 1116      PT LONG TERM GOAL #1   Title  Independent with ongoing/advanced HEP    Status  On-going      PT LONG TERM GOAL #2   Title  R knee AROM >/= 3-110 dg     Status  On-going      PT LONG TERM GOAL #3   Title  R hip and knee strength >/= 4+/5 for improved stability    Status  On-going      PT LONG TERM GOAL #4   Title  Pt will ambulate with normal gait pattern with or w/o LRAD for safe community access    Status  On-going      PT LONG TERM GOAL #5   Title  Pt will negotiate 1 flight of stairs reciprocally with normal step pattern to allow safe access to master bed and bathroom in home    Status  On-going            Plan - 01/15/18 0820    Clinical Impression Statement  Pt. able to partially achieve STG #2 today demonstrating R knee AROM 3-91 dg today.  Able to demo good overall technique with updated  HEP with review today only requiring minor cueing with prone hang knee extension stretch for positioning.  Tolerated addition of 3-way standing hip kicker with yellow TB and 6" forward step-up well today.  Ended visit with ice/compression to R knee to reduce post-exercise soreness and swelling.  Progressing well toward goals.      Rehab Potential  Good    PT Treatment/Interventions  Patient/family education;Therapeutic exercise;Therapeutic activities;Functional mobility  training;Gait training;Stair training;Balance training;Manual techniques;Passive range of motion;Scar mobilization;Taping;Dry needling;Electrical Stimulation;Cryotherapy;Vasopneumatic Device;ADLs/Self Care Home Management    PT Next Visit Plan  TKR protocol - update HEP as tolerated; ROM, quad/HS & hip strengthening, stretching, gait training working toward weaning assistive device, vasopneumatic comppression vs estim/ice for pain/edema    Consulted and Agree with Plan of Care  Patient       Patient will benefit from skilled therapeutic intervention in order to improve the following deficits and impairments:  Pain, Decreased range of motion, Impaired flexibility, Decreased strength, Decreased scar mobility, Decreased mobility, Decreased activity tolerance, Difficulty walking, Abnormal gait, Decreased balance  Visit Diagnosis: Acute pain of right knee  Stiffness of right knee, not elsewhere classified  Muscle weakness (generalized)  Difficulty in walking, not elsewhere classified  Other abnormalities of gait and mobility  Localized edema     Problem List Patient Active Problem List   Diagnosis Date Noted  . 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 Migraine with aura and without status migrainosus, not intractable 04/09/2017    Bess Harvest, PTA 01/15/18 12:10 PM    [facility] [address]  [place], [place], [address] Phone: [phone]   Fax:  [phone]  Name: Terri Waters MRN: [id number] Date of Birth: 12-11-1955

## 2018-01-18 ENCOUNTER — Ambulatory Visit: Payer: 59

## 2018-01-18 DIAGNOSIS — R262 Difficulty in walking, not elsewhere classified: Secondary | ICD-10-CM

## 2018-01-18 DIAGNOSIS — M25561 Pain in right knee: Secondary | ICD-10-CM | POA: Diagnosis not present

## 2018-01-18 DIAGNOSIS — R2689 Other abnormalities of gait and mobility: Secondary | ICD-10-CM

## 2018-01-18 DIAGNOSIS — M6281 Muscle weakness (generalized): Secondary | ICD-10-CM

## 2018-01-18 DIAGNOSIS — R600 Localized edema: Secondary | ICD-10-CM

## 2018-01-18 DIAGNOSIS — M25661 Stiffness of right knee, not elsewhere classified: Secondary | ICD-10-CM

## 2018-01-18 NOTE — Therapy (Signed)
[facility] [address]  [place], [place], [address] Phone: [phone]   Fax:  [phone]  Physical Therapy Treatment  Patient Details  Name: Terri Waters MRN: [id number] Date of Birth: 1955/07/25 Referring Provider (PT): Netta Cedars, MD   Encounter Date: 01/18/2018  PT End of Session - 01/18/18 1023    Visit Number  10    Number of Visits  16    Date for PT Re-Evaluation  02/09/18    Authorization Type  UHC    Authorization - Number of Visits  48   9 used prior to eval   PT Start Time  1015    PT Stop Time  1128    PT Time Calculation (min)  73 min    Activity Tolerance  Patient tolerated treatment well    Behavior During Therapy  [facility] for tasks assessed/performed       Past Medical History:  Diagnosis Date  . Cancer (HCC)    papillary carcinoma  . Diabetes mellitus without complication ([place])   . High cholesterol   . Hypertension   . Hypothyroidism   . Migraines   . Thyroid disease   . Ventricular tachycardia [facility])     Past Surgical History:  Procedure Laterality Date  . ABDOMINAL HYSTERECTOMY    . BREAST SURGERY     lumps removed - over 20 years ago  . THYROIDECTOMY    . TOTAL KNEE ARTHROPLASTY Right 12/18/2017   Procedure: RIGHT TOTAL KNEE ARTHROPLASTY;  Surgeon: Netta Cedars, MD;  Location: [place];  Service: Orthopedics;  Laterality: Right;    There were no vitals filed for this visit.  Subjective Assessment - 01/18/18 1021    Subjective  Pt. reporting she is performing updated HEP 2x/day.      Pertinent History  R TKR 12/18/17    How long can you sit comfortably?  34mn to an hour     How long can you stand comfortably?  40 min to an hour     How long can you walk comfortably?  1 hour     Patient Stated Goals  "to get the soreness out of my knee and walk normally again"    Currently in Pain?  Yes    Pain Score  4     Pain Location  Knee    Pain Orientation  Right    Pain Descriptors  / Indicators  Aching    Pain Type  Surgical pain;Acute pain    Pain Onset  More than a month ago    Pain Frequency  Intermittent    Multiple Pain Sites  No         [facility] PT Assessment - 01/18/18 1030      Observation/Other Assessments   Focus on Therapeutic Outcomes (FOTO)   57% (43% limitation)       AROM   AROM Assessment Site  Knee    Right/Left Knee  Right    Right Knee Extension  3    Right Knee Flexion  92      PROM   PROM Assessment Site  Knee    Right/Left Knee  Right    Right Knee Extension  2    Right Knee Flexion  98      Strength   Strength Assessment Site  Hip;Knee    Right/Left Hip  Right;Left    Right Hip Flexion  4/5    Right Hip Extension  4/5  Right Hip ABduction  4/5    Right Hip ADduction  4/5    Left Hip Flexion  4/5    Left Hip Extension  4/5    Left Hip ABduction  4/5    Left Hip ADduction  4/5    Right/Left Knee  Right;Left    Right Knee Flexion  4-/5    Right Knee Extension  4/5    Left Knee Flexion  4+/5    Left Knee Extension  4+/5                   [facility] Adult PT Treatment/Exercise - 01/18/18 1025      Ambulation/Gait   Ambulation/Gait  Yes    Ambulation/Gait Assistance  6: Modified independent (Device/Increase time)    Ambulation/Gait Assistance Details  Required cueing for upright posture and even stance time with SPC     Ambulation Distance (Feet)  90 Feet    Assistive device  Straight cane    Gait Pattern  Step-through pattern;Decreased hip/knee flexion - right;Decreased weight shift to right;Decreased stance time - right    Ambulation Surface  Level;Indoor    Stairs  Yes    Stairs Assistance  5: Supervision    Stairs Assistance Details (indicate cue type and reason)  Cues provided for alternating pattern ascending; Cues provided for R eccentric step-to pattern x 1 step; other 13 steps descended leading with R LE as this was less painful for pt.; cues required for proper SPC placement intermittently    Stair  Management Technique  One rail Right;Step to pattern;Alternating pattern;With cane    Number of Stairs  14    Height of Stairs  7      Self-Care   Self-Care  Other Self-Care Comments    Other Self-Care Comments   Instruction to perform scar incision massage in area's along scar without scabbing       Knee/Hip Exercises: Stretches   Passive Hamstring Stretch  Right;1 rep;30 seconds    Passive Hamstring Stretch Limitations  strap     Hip Flexor Stretch  Right;1 rep;60 seconds    Hip Flexor Stretch Limitations  mod thomas position with strap     Gastroc Stretch  Right;1 rep;30 seconds    Gastroc Stretch Limitations  leaning into wall       Knee/Hip Exercises: Aerobic   Recumbent Bike  half revolutions - for ROM       Knee/Hip Exercises: Machines for Strengthening   Cybex Knee Extension  B LE's 5# x 10 reps;  B con/R ecc 5# x 10 reps     Cybex Knee Flexion  B LE's 20# x 15 reps      Knee/Hip Exercises: Standing   Lateral Step Up  Right;10 reps;Step Height: 6";Hand Hold: 2    Lateral Step Up Limitations  cues to avoid heavy UE use on machine bolster     Step Down  Right;10 reps;Step Height: 4";Hand Hold: 2    Step Down Limitations  Heavy UE support on bolster;       Knee/Hip Exercises: Supine   Bridges with Ball Squeeze  Both;10 reps   with adduction ball squeeze    Straight Leg Raises  Right;15 reps    Straight Leg Raises Limitations  1#       Vasopneumatic   Number Minutes Vasopneumatic   10 minutes    Vasopnuematic Location   Knee   R   Vasopneumatic Pressure  Medium    Vasopneumatic Temperature  coldest      Manual Therapy   Manual Therapy  Joint mobilization;Passive ROM    Manual therapy comments  supine     Joint Mobilization  R knee A/P mobs for improved ROM     Passive ROM  Manual R HS, GS stretch with therapist x 30 sec each                PT Short Term Goals - 01/15/18 1203      PT SHORT TERM GOAL #1   Title  Independent with initial HEP    Status   Achieved      PT SHORT TERM GOAL #2   Title  R knee AROM >/= 5-90 dg     Status  Achieved      PT SHORT TERM GOAL #3   Title  Pt will safety ambulate with SPC household and limited community distances to increase community access    Status  Achieved        PT Long Term Goals - 01/18/18 1033      PT LONG TERM GOAL #1   Title  Independent with ongoing/advanced HEP    Status  Partially Met      PT LONG TERM GOAL #2   Title  R knee AROM >/= 3-110 dg     Status  Partially Met      PT LONG TERM GOAL #3   Title  R hip and knee strength >/= 4+/5 for improved stability    Status  On-going      PT LONG TERM GOAL #4   Title  Pt will ambulate with normal gait pattern with or w/o LRAD for safe community access    Status  On-going      PT LONG TERM GOAL #5   Title  Pt will negotiate 1 flight of stairs reciprocally with normal step pattern to allow safe access to master bed and bathroom in home    Status  On-going            Plan - 01/18/18 1028    Clinical Impression Statement  Terri Waters making good progress with therapy.  Able to demo R knee ROM improvement AROM 3-92 dg, PROM 2-98 dg.  Reports 2x/day adherence to updated HEP.  Able to navigate stairs ascending/descending at step-to pattern with one rail use and SPC with plans to eventually transition pt. to step-through pattern as able.  Pt. demonstrating improvement of nearly a full muscle grade with R hip/knee strength today with MMT now grossly 4/5 overall strength.  Pt. progressing well with therex able to initiate 4" step-down, 6" lateral step-up, and quad extension machine strengthening.  Ended visit with ice/compression to R knee to reduce post-exercise swelling and pain.  Progressing well toward established goals.      PT Frequency  --    PT Duration  --    PT Treatment/Interventions  Patient/family education;Therapeutic exercise;Therapeutic activities;Functional mobility training;Gait training;Stair training;Balance  training;Manual techniques;Passive range of motion;Scar mobilization;Taping;Dry needling;Electrical Stimulation;Cryotherapy;Vasopneumatic Device;ADLs/Self Care Home Management    PT Next Visit Plan  TKR protocol - ROM, quad/HS & hip strengthening, stretching, gait training working on even wt. shift, upright posture and toward weaning assistive device, vasopneumatic compression device to manage swelling pain    Consulted and Agree with Plan of Care  Patient       Patient will benefit from skilled therapeutic intervention in order to improve the following deficits and impairments:  Pain, Decreased range of motion, Impaired flexibility, Decreased  strength, Decreased scar mobility, Decreased mobility, Decreased activity tolerance, Difficulty walking, Abnormal gait, Decreased balance  Visit Diagnosis: Acute pain of right knee  Stiffness of right knee, not elsewhere classified  Muscle weakness (generalized)  Difficulty in walking, not elsewhere classified  Other abnormalities of gait and mobility  Localized edema     Problem List Patient Active Problem List   Diagnosis Date Noted  . 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 Migraine with aura and without status migrainosus, not intractable 04/09/2017    Terri Waters, PTA 01/18/18 11:52 AM    [facility] [address]  [place], [place], [address] Phone: [phone]   Fax:  [phone]  Name: Terri Waters MRN: [id number] Date of Birth: [birth year]

## 2018-01-20 ENCOUNTER — Ambulatory Visit: Payer: 59 | Admitting: Physical Therapy

## 2018-01-20 ENCOUNTER — Encounter: Payer: Self-pay | Admitting: Physical Therapy

## 2018-01-20 DIAGNOSIS — M25561 Pain in right knee: Secondary | ICD-10-CM | POA: Diagnosis not present

## 2018-01-20 DIAGNOSIS — R600 Localized edema: Secondary | ICD-10-CM

## 2018-01-20 DIAGNOSIS — M25661 Stiffness of right knee, not elsewhere classified: Secondary | ICD-10-CM

## 2018-01-20 DIAGNOSIS — M6281 Muscle weakness (generalized): Secondary | ICD-10-CM

## 2018-01-20 DIAGNOSIS — R2689 Other abnormalities of gait and mobility: Secondary | ICD-10-CM

## 2018-01-20 DIAGNOSIS — R262 Difficulty in walking, not elsewhere classified: Secondary | ICD-10-CM

## 2018-01-20 NOTE — Therapy (Signed)
[facility] [address]  [place], [place], [address] Phone: [phone]   Fax:  [phone]  Physical Therapy Treatment  Patient Details  Name: Terri Waters MRN: [id number] Date of Birth: Sep 29, 1955 Referring Provider (PT): Netta Cedars, MD   Encounter Date: 01/20/2018  PT End of Session - 01/20/18 0846    Visit Number  11    Number of Visits  16    Date for PT Re-Evaluation  02/09/18    Authorization Type  UHC    Authorization - Number of Visits  48   9 used prior to eval   PT Start Time  0846    PT Stop Time  0939    PT Time Calculation (min)  53 min    Activity Tolerance  Patient tolerated treatment well    Behavior During Therapy  [facility] for tasks assessed/performed       Past Medical History:  Diagnosis Date  . Cancer (HCC)    papillary carcinoma  . Diabetes mellitus without complication ([place])   . High cholesterol   . Hypertension   . Hypothyroidism   . Migraines   . Thyroid disease   . Ventricular tachycardia [facility])     Past Surgical History:  Procedure Laterality Date  . ABDOMINAL HYSTERECTOMY    . BREAST SURGERY     lumps removed - over 20 years ago  . THYROIDECTOMY    . TOTAL KNEE ARTHROPLASTY Right 12/18/2017   Procedure: RIGHT TOTAL KNEE ARTHROPLASTY;  Surgeon: Netta Cedars, MD;  Location: [facility];  Service: Orthopedics;  Laterality: Right;    There were no vitals filed for this visit.  Subjective Assessment - 01/20/18 0850    Subjective  Pt noting some popping in her knee today with pain more localized to kneecap. Did note that she was a little more active yesterday with mopping her floors to clean up mud tracked in by the dog.    Pertinent History  R TKR 12/18/17    Patient Stated Goals  "to get the soreness out of my knee and walk normally again"    Currently in Pain?  Yes    Pain Score  4     Pain Location  Knee   patella   Pain Orientation  Right    Pain Descriptors / Indicators   Throbbing    Pain Type  Surgical pain;Acute pain    Pain Frequency  Intermittent                       [facility] Adult PT Treatment/Exercise - 01/20/18 0846      Exercises   Exercises  Knee/Hip      Knee/Hip Exercises: Aerobic   Recumbent Bike  partial revolutions with brief hold for stretch to increase flexion ROM x 6 min      Knee/Hip Exercises: Supine   Patellar Mobs  R knee - all directions (moderate restriction noted)      Vasopneumatic   Number Minutes Vasopneumatic   10 minutes    Vasopnuematic Location   Knee    Vasopneumatic Pressure  Medium    Vasopneumatic Temperature   coldest      Manual Therapy   Manual Therapy  Joint mobilization;Soft tissue mobilization;Taping    Manual therapy comments  supine     Joint Mobilization  R inferior patellar mobs in flexion & tibiofemoral A/P mobs for improved flexion ROM     Soft tissue  mobilization  STM & IASTM to patellar tendon and medial joint line    Kinesiotex  Edema      Kinesiotix   Edema  R knee lantern pattern               PT Short Term Goals - 01/15/18 1203      PT SHORT TERM GOAL #1   Title  Independent with initial HEP    Status  Achieved      PT SHORT TERM GOAL #2   Title  R knee AROM >/= 5-90 dg     Status  Achieved      PT SHORT TERM GOAL #3   Title  Pt will safety ambulate with SPC household and limited community distances to increase community access    Status  Achieved        PT Long Term Goals - 01/18/18 1033      PT LONG TERM GOAL #1   Title  Independent with ongoing/advanced HEP    Status  Partially Met      PT LONG TERM GOAL #2   Title  R knee AROM >/= 3-110 dg     Status  Partially Met      PT LONG TERM GOAL #3   Title  R hip and knee strength >/= 4+/5 for improved stability    Status  On-going      PT LONG TERM GOAL #4   Title  Pt will ambulate with normal gait pattern with or w/o LRAD for safe community access    Status  On-going      PT LONG TERM GOAL #5    Title  Pt will negotiate 1 flight of stairs reciprocally with normal step pattern to allow safe access to master bed and bathroom in home    Status  On-going            Plan - 01/20/18 0852    Clinical Impression Statement  Jaydyn noting new popping in her R knee with increased patellar and medial joint line pain - very ttp with increased localized edema at medial patellar tendon and medial joint line as well as overall edema noted in R knee today. Treatment focuing on manual therapy for increasing patellar mobility and releasing scar tissue adhesions as well as reducing pain/edema with pt noting significant improvement by end of session. Kinesiotaping applied in lantern pattern to promote further reduction in pain/edema and will assess response as of next visit. If medial joint line pain persist, may consider requesting order for ionto.     Rehab Potential  Good    PT Treatment/Interventions  Patient/family education;Therapeutic exercise;Therapeutic activities;Functional mobility training;Gait training;Stair training;Balance training;Manual techniques;Passive range of motion;Scar mobilization;Taping;Dry needling;Electrical Stimulation;Cryotherapy;Vasopneumatic Device;ADLs/Self Care Home Management    PT Next Visit Plan  TKR protocol - ROM with stretching& manual therapy as indicated, quad/HS & hip strengthening, gait training to normalize gait pattern while weaning AD as indicated, taping +/- vasopneumatic compression to manage edema/pain    Consulted and Agree with Plan of Care  Patient       Patient will benefit from skilled therapeutic intervention in order to improve the following deficits and impairments:  Pain, Decreased range of motion, Impaired flexibility, Decreased strength, Decreased scar mobility, Decreased mobility, Decreased activity tolerance, Difficulty walking, Abnormal gait, Decreased balance  Visit Diagnosis: Acute pain of right knee  Stiffness of right knee, not  elsewhere classified  Muscle weakness (generalized)  Difficulty in walking, not elsewhere classified  Other abnormalities  of gait and mobility  Localized edema     Problem List Patient Active Problem List   Diagnosis Date Noted  . 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 Migraine with aura and without status migrainosus, not intractable 04/09/2017    Percival Spanish, PT, MPT 01/20/2018, 11:30 AM  [facility] [address]  [place], [place], [address] Phone: [phone]   Fax:  [phone]  Name: ADNA NOFZIGER MRN: [id number] Date of Birth: 11/14/1955

## 2018-01-25 ENCOUNTER — Ambulatory Visit: Payer: 59

## 2018-01-25 DIAGNOSIS — M25661 Stiffness of right knee, not elsewhere classified: Secondary | ICD-10-CM

## 2018-01-25 DIAGNOSIS — R600 Localized edema: Secondary | ICD-10-CM

## 2018-01-25 DIAGNOSIS — R262 Difficulty in walking, not elsewhere classified: Secondary | ICD-10-CM

## 2018-01-25 DIAGNOSIS — R2689 Other abnormalities of gait and mobility: Secondary | ICD-10-CM

## 2018-01-25 DIAGNOSIS — M25561 Pain in right knee: Secondary | ICD-10-CM | POA: Diagnosis not present

## 2018-01-25 DIAGNOSIS — M6281 Muscle weakness (generalized): Secondary | ICD-10-CM

## 2018-01-25 NOTE — Therapy (Signed)
[facility] [address]  [place], [place], [address] Phone: [phone]   Fax:  [phone]  Physical Therapy Treatment  Patient Details  Name: SHERIN MURDOCH MRN: [id number] Date of Birth: [birth year] Referring Provider (PT): Netta Cedars, MD   Encounter Date: 01/25/2018  PT End of Session - 01/25/18 0801    Visit Number  12    Number of Visits  16    Date for PT Re-Evaluation  02/09/18    Authorization Type  UHC    Authorization - Number of Visits  48   9 used prior to eval   PT Start Time  0757    PT Stop Time  0857    PT Time Calculation (min)  60 min    Activity Tolerance  Patient tolerated treatment well    Behavior During Therapy  [facility] for tasks assessed/performed       Past Medical History:  Diagnosis Date  . Cancer (HCC)    papillary carcinoma  . Diabetes mellitus without complication ([place])   . High cholesterol   . Hypertension   . Hypothyroidism   . Migraines   . Thyroid disease   . Ventricular tachycardia [facility])     Past Surgical History:  Procedure Laterality Date  . ABDOMINAL HYSTERECTOMY    . BREAST SURGERY     lumps removed - over 20 years ago  . THYROIDECTOMY    . TOTAL KNEE ARTHROPLASTY Right 12/18/2017   Procedure: RIGHT TOTAL KNEE ARTHROPLASTY;  Surgeon: Netta Cedars, MD;  Location: [place];  Service: Orthopedics;  Laterality: Right;    There were no vitals filed for this visit.  Subjective Assessment - 01/25/18 0759    Subjective  Pt. noting she is still seeing noticeable increase in R knee swelling after sitting 15 min.  Unsure of benefit from taping.      Pertinent History  R TKR 12/18/17    Patient Stated Goals  "to get the soreness out of my knee and walk normally again"    Currently in Pain?  Yes    Pain Score  6     Pain Location  Knee    Pain Orientation  Right    Pain Descriptors / Indicators  Throbbing    Pain Type  Surgical pain;Acute pain    Pain Onset  More than a  month ago    Pain Frequency  Intermittent    Aggravating Factors   flexion     Pain Relieving Factors  ice     Multiple Pain Sites  No         [facility] PT Assessment - 01/25/18 0815      AROM   AROM Assessment Site  Knee    Right/Left Knee  Right    Right Knee Extension  3    Right Knee Flexion  100      PROM   PROM Assessment Site  Knee    Right/Left Knee  Right    Right Knee Flexion  105      Strength   Strength Assessment Site  Hip;Knee    Right/Left Hip  Right;Left    Right Hip Flexion  4/5    Right Hip Extension  4/5    Right Hip ABduction  4/5    Right Hip ADduction  4/5    Left Hip Flexion  4/5    Left Hip Extension  4/5    Left Hip ABduction  4/5  Left Hip ADduction  4+/5    Right/Left Knee  Right;Left    Right Knee Flexion  4-/5    Right Knee Extension  4/5    Left Knee Flexion  4+/5    Left Knee Extension  4+/5                   [facility] Adult PT Treatment/Exercise - 01/25/18 0815      Ambulation/Gait   Ambulation/Gait  Yes    Ambulation/Gait Assistance  6: Modified independent (Device/Increase time)    Ambulation/Gait Assistance Details  Required cueing for upright posture and even stance time on B LE     Ambulation Distance (Feet)  90 Feet    Assistive device  Straight cane    Gait Pattern  Step-through pattern;Decreased hip/knee flexion - right;Decreased weight shift to right;Decreased stance time - right    Ambulation Surface  Level;Indoor    Stairs  Yes    Stairs Assistance  5: Supervision    Stairs Assistance Details (indicate cue type and reason)  Provided cueing to encouraged step-through pattern; pt. able to demo somewhat improved R LE control descending today however still limited quad control descending with L LE and evident hip weakness     Stair Management Technique  One rail Right;Step to pattern;Alternating pattern;With cane    Number of Stairs  14    Height of Stairs  7      Knee/Hip Exercises: Stretches   Sports administrator   Right;1 rep;30 seconds    Quad Stretch Limitations  prone with strap     Hip Flexor Stretch  Right;1 rep;60 seconds    Hip Flexor Stretch Limitations  mod thomas position with strap     Knee: Self-Stretch to increase Flexion  Right   5" x 10 reps      Knee/Hip Exercises: Aerobic   Recumbent Bike  partial revolutions with brief hold for stretch to increase flexion ROM x 6 min      Knee/Hip Exercises: Standing   Terminal Knee Extension  Right;15 reps    Theraband Level (Terminal Knee Extension)  Level 4 (Blue)    Terminal Knee Extension Limitations  with R step up to 4" step and therapist anchoring blue TB for TKE strengthening     Forward Step Up  Right;15 reps;Hand Hold: 1;Step Height: 6"    Forward Step Up Limitations  1 UE support on counter       Knee/Hip Exercises: Supine   Straight Leg Raises  Right;15 reps    Straight Leg Raises Limitations  1#    Cues for quad set prior to movement provided      Vasopneumatic   Number Minutes Vasopneumatic   15 minutes    Vasopnuematic Location   Knee    Vasopneumatic Pressure  Medium    Vasopneumatic Temperature   coldest      Manual Therapy   Manual Therapy  Joint mobilization;Soft tissue mobilization    Manual therapy comments  supine     Joint Mobilization  R patellar mobs, A/P mobs for improved flexion ROM     Soft tissue mobilization  STM with free-up to scar     Passive ROM  Manual R HS, GS stretch with therapist x 30 sec each                PT Short Term Goals - 01/15/18 1203      PT SHORT TERM GOAL #1   Title  Independent with initial  HEP    Status  Achieved      PT SHORT TERM GOAL #2   Title  R knee AROM >/= 5-90 dg     Status  Achieved      PT SHORT TERM GOAL #3   Title  Pt will safety ambulate with SPC household and limited community distances to increase community access    Status  Achieved        PT Long Term Goals - 01/25/18 8315      PT LONG TERM GOAL #1   Title  Independent with ongoing/advanced  HEP    Status  Partially Met      PT LONG TERM GOAL #2   Title  R knee AROM >/= 3-110 dg     Status  Partially Met      PT LONG TERM GOAL #3   Title  R hip and knee strength >/= 4+/5 for improved stability    Status  On-going      PT LONG TERM GOAL #4   Title  Pt will ambulate with normal gait pattern with or w/o LRAD for safe community access    Status  On-going      PT LONG TERM GOAL #5   Title  Pt will negotiate 1 flight of stairs reciprocally with normal step pattern to allow safe access to master bed and bathroom in home    Status  On-going            Plan - 01/25/18 1227    Clinical Impression Statement  Pt. making good progress with R knee ROM today R AROM 3-100 dg, PROM 2-105 dg.  Majority of LTG's still ongoing at this time with pt. R hip/knee strength with MMT grossly 4/5 strength.  Still ambulating with forward trunk lean and gait training today emphasizing upright posture and even stance time B with some carryover with SPC.  Pt. remains more comfortable and stable with step-to pattern descending stairs today however able to perform reciprocal ascending/descending with cueing and mod rail use.  Pt. will continue to benefit from further skilled therapy to improve functional strength and mobility.      Rehab Potential  Good    PT Frequency  --    PT Duration  --    PT Treatment/Interventions  Patient/family education;Therapeutic exercise;Therapeutic activities;Functional mobility training;Gait training;Stair training;Balance training;Manual techniques;Passive range of motion;Scar mobilization;Taping;Dry needling;Electrical Stimulation;Cryotherapy;Vasopneumatic Device;ADLs/Self Care Home Management    PT Next Visit Plan  TKR protocol - ROM with stretching& manual therapy as indicated, quad/HS & hip strengthening, gait training to normalize gait pattern while weaning AD as indicated, taping +/- vasopneumatic compression to manage edema/pain    Consulted and Agree with Plan of  Care  Patient       Patient will benefit from skilled therapeutic intervention in order to improve the following deficits and impairments:  Pain, Decreased range of motion, Impaired flexibility, Decreased strength, Decreased scar mobility, Decreased mobility, Decreased activity tolerance, Difficulty walking, Abnormal gait, Decreased balance  Visit Diagnosis: Acute pain of right knee  Stiffness of right knee, not elsewhere classified  Muscle weakness (generalized)  Difficulty in walking, not elsewhere classified  Other abnormalities of gait and mobility  Localized edema     Problem List Patient Active Problem List   Diagnosis Date Noted  . 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 Migraine with aura and without status migrainosus, not intractable 04/09/2017    Bess Harvest, PTA 01/25/18 12:41 PM   [facility] [address]  [place], [place], [address] Phone: [phone]   Fax:  [phone]  Name: NAFISAH RUNIONS MRN: [id number] Date of Birth: July 18, 1955

## 2018-01-27 ENCOUNTER — Encounter: Payer: Self-pay | Admitting: Physical Therapy

## 2018-01-27 ENCOUNTER — Ambulatory Visit: Payer: 59 | Admitting: Physical Therapy

## 2018-01-27 DIAGNOSIS — M25661 Stiffness of right knee, not elsewhere classified: Secondary | ICD-10-CM

## 2018-01-27 DIAGNOSIS — M6281 Muscle weakness (generalized): Secondary | ICD-10-CM

## 2018-01-27 DIAGNOSIS — M25561 Pain in right knee: Secondary | ICD-10-CM | POA: Diagnosis not present

## 2018-01-27 DIAGNOSIS — R600 Localized edema: Secondary | ICD-10-CM

## 2018-01-27 DIAGNOSIS — R2689 Other abnormalities of gait and mobility: Secondary | ICD-10-CM

## 2018-01-27 DIAGNOSIS — R262 Difficulty in walking, not elsewhere classified: Secondary | ICD-10-CM

## 2018-01-27 NOTE — Addendum Note (Signed)
Addended by: Percival Spanish on: 01/27/2018 01:12 PM   Modules accepted: Orders

## 2018-01-27 NOTE — Therapy (Addendum)
[facility] [address]  [place], [place], [address] Phone: [phone]   Fax:  [phone]  Physical Therapy Treatment  Patient Details  Name: Terri Waters MRN: [id number] Date of Birth: [birth year] Referring Provider (PT): Netta Cedars, MD   Encounter Date: 01/27/2018  PT End of Session - 01/27/18 0844    Visit Number  13    Number of Visits  21    Date for PT Re-Evaluation  02/24/18    Authorization Type  UHC    Authorization - Number of Visits  48   9 used prior to eval   PT Start Time  0844    PT Stop Time  0940    PT Time Calculation (min)  56 min    Activity Tolerance  Patient tolerated treatment well    Behavior During Therapy  [facility] for tasks assessed/performed       Past Medical History:  Diagnosis Date  . Cancer (HCC)    papillary carcinoma  . Diabetes mellitus without complication ([place])   . High cholesterol   . Hypertension   . Hypothyroidism   . Migraines   . Thyroid disease   . Ventricular tachycardia [facility])     Past Surgical History:  Procedure Laterality Date  . ABDOMINAL HYSTERECTOMY    . BREAST SURGERY     lumps removed - over 20 years ago  . THYROIDECTOMY    . TOTAL KNEE ARTHROPLASTY Right 12/18/2017   Procedure: RIGHT TOTAL KNEE ARTHROPLASTY;  Surgeon: Netta Cedars, MD;  Location: [place];  Service: Orthopedics;  Laterality: Right;    There were no vitals filed for this visit.  Subjective Assessment - 01/27/18 0846    Subjective  Pt saw MD yesterday and expressed concerns about ongoing "popping" and patellar pain. States MD told her to keep going with PT for the next month & he will reassess need for manipulation on next f/u visit in a month. Pt also noted MD wanting her to start walking more so she is thinking of oing to the [facility]. No pain this morning, but notes tightness.    Pertinent History  R TKR 12/18/17    Patient Stated Goals  "to get the soreness out of my knee and walk  normally again"    Currently in Pain?  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)    Leisure  read, ball games, daily water aerobics at [employer] prior to surgery      Observation/Other Assessments   Focus on Therapeutic Outcomes (FOTO)   57% (43% limitation)    as of 01/18/18 FOTO                  [facility] Adult PT Treatment/Exercise - 01/27/18 0844      Exercises   Exercises  Knee/Hip      Knee/Hip Exercises: Aerobic   Recumbent Bike  partial revolutions with brief hold for stretch to increase flexion ROM x 6 min      Knee/Hip Exercises: Machines for Strengthening   Cybex Knee Extension  B LE 15# x 15, B con/L ecc 10# x 15    Cybex Knee Flexion  B LE 20# x 15, B con/L ecc 15# x 15    Cybex Leg Press  B LE 20# x 15      Knee/Hip Exercises: Standing   Hip Flexion  Right;Left;10 reps;Knee straight;Stengthening    Hip Flexion Limitations  red TB at ankle; UE support on back of chair    Side Lunges  Right;Left;10 reps;3 seconds    Side Lunges Limitations  TRX - cues to avoid knee fwd of toes    Hip ADduction  Right;Left;10 reps;Strengthening    Hip ADduction Limitations  red TB at ankle; UE support on back of chair    Hip Abduction  Right;Left;10 reps;Knee straight;Stengthening    Abduction Limitations  red TB at ankle; UE support on back of chair    Hip Extension  Right;Left;10 reps;Knee straight;Stengthening    Extension Limitations  red TB at ankle; UE support on back of chair    Functional Squat  15 reps;3 seconds    Functional Squat Limitations  TRX - cues  for even weight shift (tendency to increase wt shift to L LE)      Knee/Hip Exercises: Seated   Hamstring Curl  Right;20 reps;Strengthening    Hamstring Limitations  red TB      Modalities   Modalities  Cryotherapy      Cryotherapy   Number Minutes Cryotherapy  10 Minutes    Cryotherapy Location  Knee    Type of Cryotherapy  Ice pack             PT Education - 01/27/18 0932    Education Details  HEP update - resisted standing 4 way SLR & HS curls with red TB; Instruction in proper use of weight machines for use at the [facility]) Educated  Patient    Methods  Explanation;Demonstration;Handout    Comprehension  Verbalized understanding;Returned demonstration;Need further instruction       PT Short Term Goals - 01/15/18 1203      PT SHORT TERM GOAL #1   Title  Independent with initial HEP    Status  Achieved      PT SHORT TERM GOAL #2   Title  R knee AROM >/= 5-90 dg     Status  Achieved      PT SHORT TERM GOAL #3   Title  Pt will safety ambulate with [place] household and limited community distances to increase community access    Status  Achieved        PT Long Term Goals - 01/27/18 0930      PT LONG TERM GOAL #1   Title  Independent with ongoing/advanced HEP    Status  Partially Met    Target Date  02/24/18      PT LONG TERM GOAL #2   Title  R knee AROM >/= 3-110 dg     Status  Partially Met    Target Date  02/24/18      PT LONG TERM GOAL #3   Title  R hip and knee strength >/= 4+/5 for improved stability    Status  On-going    Target Date  02/24/18      PT LONG TERM GOAL #4  Title  Pt will ambulate with normal gait pattern with or w/o LRAD for safe community access    Status  On-going    Target Date  02/24/18      PT LONG TERM GOAL #5   Title  Pt will negotiate 1 flight of stairs reciprocally with normal step pattern to allow safe access to master bed and bathroom in home    Status  On-going    Target Date  02/24/18            Plan  - 01/27/18 0925    Clinical Impression Statement  Terri Waters reporting MD pleased with her current progress but wants her to continue to work on ROM to avoid need for manipulation and would like her to start walking more. Pt feeling comfortable with quad and knee flexion stretches and denies need ofr review today, therefore focused on progression of strengthening to target areas of weakness identified on MMT last session as well as instruction in use of weight machines for use at the [facility] as pt planning to start going to the [facility] for walking. Pt reporting MD wants her to continue PT until next MD appt on 02/23/18, therefore will extend POC & visits for 4 more weeks at 2x/wk.    Rehab Potential  Good    PT Frequency  2x / week    PT Duration  4 weeks    PT Treatment/Interventions  Patient/family education;Therapeutic exercise;Therapeutic activities;Functional mobility training;Gait training;Stair training;Balance training;Manual techniques;Passive range of motion;Scar mobilization;Taping;Dry needling;Electrical Stimulation;Cryotherapy;Vasopneumatic Device;ADLs/Self Care Home Management    PT Next Visit Plan  TKR protocol - ROM with stretching& manual therapy as indicated, quad/HS & hip strengthening, gait training to normalize gait pattern while weaning AD as indicated, taping +/- vasopneumatic compression to manage edema/pain    Consulted and Agree with Plan of Care  Patient       Patient will benefit from skilled therapeutic intervention in order to improve the following deficits and impairments:  Pain, Decreased range of motion, Impaired flexibility, Decreased strength, Decreased scar mobility, Decreased mobility, Decreased activity tolerance, Difficulty walking, Abnormal gait, Decreased balance  Visit Diagnosis: Acute pain of right knee  Stiffness of right knee, not elsewhere classified  Muscle weakness (generalized)  Difficulty in walking, not elsewhere classified  Other abnormalities of gait and  mobility  Localized edema     Problem List Patient Active Problem List   Diagnosis Date Noted  . 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 Migraine with aura and without status migrainosus, not intractable 04/09/2017    Percival Spanish, PT, MPT 01/27/2018, 1:09 PM  [facility] [address]  [place], [place], [address] Phone: [phone]   Fax:  [phone]  Name: Terri Waters [place] MRN: [id number] Date of Birth: 1955-12-18

## 2018-02-01 ENCOUNTER — Ambulatory Visit: Payer: 59

## 2018-02-01 DIAGNOSIS — M6281 Muscle weakness (generalized): Secondary | ICD-10-CM

## 2018-02-01 DIAGNOSIS — M25561 Pain in right knee: Secondary | ICD-10-CM

## 2018-02-01 DIAGNOSIS — R262 Difficulty in walking, not elsewhere classified: Secondary | ICD-10-CM

## 2018-02-01 DIAGNOSIS — R2689 Other abnormalities of gait and mobility: Secondary | ICD-10-CM

## 2018-02-01 DIAGNOSIS — R600 Localized edema: Secondary | ICD-10-CM

## 2018-02-01 DIAGNOSIS — M25661 Stiffness of right knee, not elsewhere classified: Secondary | ICD-10-CM

## 2018-02-01 NOTE — Therapy (Signed)
[facility] [address]  [place], [place], [address] Phone: [phone]   Fax:  [phone]  Physical Therapy Treatment  Patient Details  Name: Terri Waters MRN: [id number] Date of Birth: 09/24/1955 Referring Provider (PT): Netta Cedars, MD   Encounter Date: 02/01/2018  PT End of Session - 02/01/18 0807    Visit Number  14    Number of Visits  21    Date for PT Re-Evaluation  02/24/18    Authorization Type  UHC    Authorization - Number of Visits  48   9 used prior to eval   PT Start Time  0802    PT Stop Time  0840    PT Time Calculation (min)  38 min    Activity Tolerance  Patient tolerated treatment well    Behavior During Therapy  [facility] for tasks assessed/performed       Past Medical History:  Diagnosis Date  . Cancer (HCC)    papillary carcinoma  . Diabetes mellitus without complication ([place])   . High cholesterol   . Hypertension   . Hypothyroidism   . Migraines   . Thyroid disease   . Ventricular tachycardia [facility])     Past Surgical History:  Procedure Laterality Date  . ABDOMINAL HYSTERECTOMY    . BREAST SURGERY     lumps removed - over 20 years ago  . THYROIDECTOMY    . TOTAL KNEE ARTHROPLASTY Right 12/18/2017   Procedure: RIGHT TOTAL KNEE ARTHROPLASTY;  Surgeon: Netta Cedars, MD;  Location: [place];  Service: Orthopedics;  Laterality: Right;    There were no vitals filed for this visit.  Subjective Assessment - 02/01/18 0805    Subjective  Pt. reporting increase R knee "tightness" over weekend without known trigger.      Pertinent History  R TKR 12/18/17    How long can you sit comfortably?  66mn to an hour     How long can you stand comfortably?  40 min to an hour     How long can you walk comfortably?  1 hour     Patient Stated Goals  "to get the soreness out of my knee and walk normally again"    Currently in Pain?  Yes    Pain Score  7     Pain Location  Knee    Pain Orientation   Right    Pain Descriptors / Indicators  Throbbing    Pain Type  Surgical pain;Acute pain    Pain Onset  More than a month ago    Aggravating Factors   end range flexion                       [facility] Adult PT Treatment/Exercise - 02/01/18 0822      Knee/Hip Exercises: Stretches   Quad Stretch  Right;1 rep;60 seconds    Quad Stretch Limitations  prone with strap     Hip Flexor Stretch  Right;1 rep;60 seconds    Hip Flexor Stretch Limitations  mod thomas position with strap       Knee/Hip Exercises: Aerobic   Recumbent Bike  Full revolutions for ROM x 1 min - no resistance    difficulty    Nustep  L3 x 6 min (LE/UE)      Knee/Hip Exercises: Standing   Heel Raises  Both;15 reps    Heel Raises Limitations  at TM rail  Hip Flexion  Right;Left;Knee straight;Stengthening   x 12 reps    Hip Flexion Limitations  red TB at ankle; UE support on back of chair    Hip ADduction  Right;Left;Strengthening   x 12 reps    Hip ADduction Limitations  red TB at ankle; UE support on back of chair    Hip Abduction  Right;Left;Knee straight;Stengthening   x 12 reps    Abduction Limitations  red TB at ankle; UE support on back of chair    Hip Extension  Right;Left;Knee straight;Stengthening   x 12 reps    Extension Limitations  red TB at ankle; UE support on back of chair      Knee/Hip Exercises: Sidelying   Hip ABduction  Right   x 12 reps    Hip ABduction Limitations  Cues for proper motion     Hip ADduction  Right;Strengthening   x 12 reps               PT Short Term Goals - 01/15/18 1203      PT SHORT TERM GOAL #1   Title  Independent with initial HEP    Status  Achieved      PT SHORT TERM GOAL #2   Title  R knee AROM >/= 5-90 dg     Status  Achieved      PT SHORT TERM GOAL #3   Title  Pt will safety ambulate with SPC household and limited community distances to increase community access    Status  Achieved        PT Long Term Goals - 01/27/18 0930       PT LONG TERM GOAL #1   Title  Independent with ongoing/advanced HEP    Status  Partially Met    Target Date  02/24/18      PT LONG TERM GOAL #2   Title  R knee AROM >/= 3-110 dg     Status  Partially Met    Target Date  02/24/18      PT LONG TERM GOAL #3   Title  R hip and knee strength >/= 4+/5 for improved stability    Status  On-going    Target Date  02/24/18      PT LONG TERM GOAL #4   Title  Pt will ambulate with normal gait pattern with or w/o LRAD for safe community access    Status  On-going    Target Date  02/24/18      PT LONG TERM GOAL #5   Title  Pt will negotiate 1 flight of stairs reciprocally with normal step pattern to allow safe access to master bed and bathroom in home    Status  On-going    Target Date  02/24/18            Plan - 02/01/18 0809    Clinical Impression Statement  Pt. reporting increased R knee stiffness today however denies pain.  Reports she has been performing updated HEP every day since last session.  Tolerated all proximal hip and flexion/extension ROM activities well today in session with slight progression in reps with 4-way hip kicker with red TB.  Ended visit with pt. deferring ice reporting she will ice R knee at home.  Progressing well toward goals.      Rehab Potential  Good    PT Frequency  2x / week    PT Duration  4 weeks    PT Treatment/Interventions  Patient/family education;Therapeutic exercise;Therapeutic  activities;Functional mobility training;Gait training;Stair training;Balance training;Manual techniques;Passive range of motion;Scar mobilization;Taping;Dry needling;Electrical Stimulation;Cryotherapy;Vasopneumatic Device;ADLs/Self Care Home Management    PT Next Visit Plan  TKR protocol - ROM with stretching& manual therapy as indicated, quad/HS & hip strengthening, gait training to normalize gait pattern while weaning AD as indicated, taping +/- vasopneumatic compression to manage edema/pain    Consulted and Agree with  Plan of Care  Patient       Patient will benefit from skilled therapeutic intervention in order to improve the following deficits and impairments:  Pain, Decreased range of motion, Impaired flexibility, Decreased strength, Decreased scar mobility, Decreased mobility, Decreased activity tolerance, Difficulty walking, Abnormal gait, Decreased balance  Visit Diagnosis: Acute pain of right knee  Stiffness of right knee, not elsewhere classified  Muscle weakness (generalized)  Difficulty in walking, not elsewhere classified  Other abnormalities of gait and mobility  Localized edema     Problem List Patient Active Problem List   Diagnosis Date Noted  . 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 Migraine with aura and without status migrainosus, not intractable 04/09/2017    Bess Harvest, PTA 02/01/18 12:59 PM   [facility] [address]  [place], [place], [address] Phone: [phone]   Fax:  [phone]  Name: Terri Waters MRN: [id number] Date of Birth: [birth year]

## 2018-02-03 ENCOUNTER — Ambulatory Visit: Payer: 59 | Admitting: Physical Therapy

## 2018-02-03 ENCOUNTER — Encounter: Payer: Self-pay | Admitting: Physical Therapy

## 2018-02-03 DIAGNOSIS — R262 Difficulty in walking, not elsewhere classified: Secondary | ICD-10-CM

## 2018-02-03 DIAGNOSIS — M25661 Stiffness of right knee, not elsewhere classified: Secondary | ICD-10-CM

## 2018-02-03 DIAGNOSIS — R2689 Other abnormalities of gait and mobility: Secondary | ICD-10-CM

## 2018-02-03 DIAGNOSIS — M25561 Pain in right knee: Secondary | ICD-10-CM

## 2018-02-03 DIAGNOSIS — M6281 Muscle weakness (generalized): Secondary | ICD-10-CM

## 2018-02-03 DIAGNOSIS — R600 Localized edema: Secondary | ICD-10-CM

## 2018-02-03 NOTE — Therapy (Signed)
[facility] [address]  [place], [place], [address] Phone: [phone]   Fax:  [phone]  Physical Therapy Treatment  Patient Details  Name: Terri Waters MRN: [id number] Date of Birth: 1955-08-22 Referring Provider (PT): Netta Cedars, MD   Encounter Date: 02/03/2018  PT End of Session - 02/03/18 0807    Visit Number  15    Number of Visits  21    Date for PT Re-Evaluation  02/24/18    Authorization Type  UHC    Authorization - Number of Visits  48   9 used prior to eval   PT Start Time  0807   pt arrived late   PT Stop Time  0908    PT Time Calculation (min)  61 min    Activity Tolerance  Patient tolerated treatment well    Behavior During Therapy  [facility] for tasks assessed/performed       Past Medical History:  Diagnosis Date  . Cancer (HCC)    papillary carcinoma  . Diabetes mellitus without complication ([place])   . High cholesterol   . Hypertension   . Hypothyroidism   . Migraines   . Thyroid disease   . Ventricular tachycardia [facility])     Past Surgical History:  Procedure Laterality Date  . ABDOMINAL HYSTERECTOMY    . BREAST SURGERY     lumps removed - over 20 years ago  . THYROIDECTOMY    . TOTAL KNEE ARTHROPLASTY Right 12/18/2017   Procedure: RIGHT TOTAL KNEE ARTHROPLASTY;  Surgeon: Netta Cedars, MD;  Location: [place];  Service: Orthopedics;  Laterality: Right;    There were no vitals filed for this visit.  Subjective Assessment - 02/03/18 0808    Subjective  Pt reporting pain all day yesterday, but just tight this morning.    Pertinent History  R TKR 12/18/17    Patient Stated Goals  "to get the soreness out of my knee and walk normally again"    Currently in Pain?  No/denies    Pain Onset  More than a month ago         [facility] PT Assessment - 02/03/18 0807      AROM   Right Knee Extension  2    Right Knee Flexion  107      PROM   Right Knee Extension  0    Right Knee Flexion   109                   [facility] Adult PT Treatment/Exercise - 02/03/18 0807      Exercises   Exercises  Knee/Hip      Knee/Hip Exercises: Aerobic   Recumbent Bike  full revolutions x 6 min (unable to keep adequate pace to add resistance)      Knee/Hip Exercises: Standing   Forward Lunges  Right;Left;10 reps;3 seconds    Forward Lunges Limitations  cues to avoid knee fwd of toes    Side Lunges  Right;Left;10 reps;3 seconds    Side Lunges Limitations  TRX - cues to avoidLE ER and knee fwd of toes    Functional Squat  15 reps;3 seconds    Functional Squat Limitations  TRX - cues for even weight shift (tendency to increase wt shift to L LE)      Knee/Hip Exercises: Seated   Other Seated Knee/Hip Exercises  R Fitter leg press (1 black/ 1 blue) x 15    Hamstring Curl  Right;15 reps;Strengthening    Hamstring Limitations  green TB      Knee/Hip Exercises: Supine   Quad Sets  Right;5 reps   10 sec hold   Quad Sets Limitations  + PT providing longitudinal distraction    Patellar Mobs  R knee - all directions - emphasis on sup/inf (moderate restriction noted)      Modalities   Modalities  Vasopneumatic      Vasopneumatic   Number Minutes Vasopneumatic   10 minutes    Vasopnuematic Location   Knee    Vasopneumatic Pressure  Medium    Vasopneumatic Temperature   coldest      Manual Therapy   Manual Therapy  Joint mobilization;Soft tissue mobilization;Myofascial release;Passive ROM;Manual Traction    Manual therapy comments  supine & hooklying    Joint Mobilization  R inferior patellar mobs in flexion & tibiofemoral A/P mobs for improved flexion ROM, femorotibial A/P mobs fro extension ROM    Soft tissue mobilization  Scar massage focusing on superior patella to distal incision; roller stick to mid quads    Myofascial Release  manual TPR to mid VI/RF    Passive ROM  contract/relax into end range R knee flexion    Manual Traction  R LE longitudinal distraction + active quad  set 5 x 10 sec to promiote TKE               PT Short Term Goals - 01/15/18 1203      PT SHORT TERM GOAL #1   Title  Independent with initial HEP    Status  Achieved      PT SHORT TERM GOAL #2   Title  R knee AROM >/= 5-90 dg     Status  Achieved      PT SHORT TERM GOAL #3   Title  Pt will safety ambulate with SPC household and limited community distances to increase community access    Status  Achieved        PT Long Term Goals - 01/27/18 0930      PT LONG TERM GOAL #1   Title  Independent with ongoing/advanced HEP    Status  Partially Met    Target Date  02/24/18      PT LONG TERM GOAL #2   Title  R knee AROM >/= 3-110 dg     Status  Partially Met    Target Date  02/24/18      PT LONG TERM GOAL #3   Title  R hip and knee strength >/= 4+/5 for improved stability    Status  On-going    Target Date  02/24/18      PT LONG TERM GOAL #4   Title  Pt will ambulate with normal gait pattern with or w/o LRAD for safe community access    Status  On-going    Target Date  02/24/18      PT LONG TERM GOAL #5   Title  Pt will negotiate 1 flight of stairs reciprocally with normal step pattern to allow safe access to master bed and bathroom in home    Status  On-going    Target Date  02/24/18            Plan - 02/03/18 0810    Clinical Impression Statement  R knee AROM continues to steadily improve with gap between AROM and PROM narrowing. Pt continues to report increased stiffness/tightness especially upon rising from bed in the morning which is expected  at this stage of recovery. Pt reporting less reliance on [facility] but notes occasional "catch" in knee and continues to demonstrate decreased weight shift to R with gait. Continued skilled PT indicated to address remaining ROM, strength and gait deficits.    Rehab Potential  Good    PT Frequency  2x / week    PT Duration  4 weeks    PT Treatment/Interventions  Patient/family education;Therapeutic exercise;Therapeutic  activities;Functional mobility training;Gait training;Stair training;Balance training;Manual techniques;Passive range of motion;Scar mobilization;Taping;Dry needling;Electrical Stimulation;Cryotherapy;Vasopneumatic Device;ADLs/Self Care Home Management    PT Next Visit Plan  TKR protocol - ROM with stretching & manual therapy as indicated, quad/HS & hip strengthening, gait training to normalize gait pattern while weaning AD as indicated, taping +/- vasopneumatic compression to manage edema/pain    Consulted and Agree with Plan of Care  Patient       Patient will benefit from skilled therapeutic intervention in order to improve the following deficits and impairments:  Pain, Decreased range of motion, Impaired flexibility, Decreased strength, Decreased scar mobility, Decreased mobility, Decreased activity tolerance, Difficulty walking, Abnormal gait, Decreased balance  Visit Diagnosis: Acute pain of right knee  Stiffness of right knee, not elsewhere classified  Muscle weakness (generalized)  Difficulty in walking, not elsewhere classified  Other abnormalities of gait and mobility  Localized edema     Problem List Patient Active Problem List   Diagnosis Date Noted  . 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 Migraine with aura and without status migrainosus, not intractable 04/09/2017    Percival Spanish, PT, MPT 02/03/2018, 9:15 AM  [facility] [address]  [place], [place], [address] Phone: [phone]   Fax:  [phone]  Name: KEYLEE SHRESTHA MRN: [id number] Date of Birth: 11/12/1955

## 2018-02-08 ENCOUNTER — Ambulatory Visit: Payer: 59

## 2018-02-08 DIAGNOSIS — R262 Difficulty in walking, not elsewhere classified: Secondary | ICD-10-CM

## 2018-02-08 DIAGNOSIS — M25661 Stiffness of right knee, not elsewhere classified: Secondary | ICD-10-CM

## 2018-02-08 DIAGNOSIS — R2689 Other abnormalities of gait and mobility: Secondary | ICD-10-CM

## 2018-02-08 DIAGNOSIS — R600 Localized edema: Secondary | ICD-10-CM

## 2018-02-08 DIAGNOSIS — M25561 Pain in right knee: Secondary | ICD-10-CM

## 2018-02-08 DIAGNOSIS — M6281 Muscle weakness (generalized): Secondary | ICD-10-CM

## 2018-02-08 NOTE — Therapy (Signed)
[facility] [address]  [place], [place], [address] Phone: [phone]   Fax:  [phone]  Physical Therapy Treatment  Patient Details  Name: Terri Waters MRN: [id number] Date of Birth: 08-07-1955 Referring Provider (PT): Netta Cedars, MD   Encounter Date: 02/08/2018  PT End of Session - 02/08/18 1108    Visit Number  16    Number of Visits  21    Date for PT Re-Evaluation  02/24/18    Authorization Type  UHC    Authorization - Number of Visits  48   9 used prior to eval   PT Start Time  0926    PT Stop Time  1024   ended visit with 15 min moist heat to thigh and ice pack to R knee   PT Time Calculation (min)  58 min    Activity Tolerance  Patient tolerated treatment well    Behavior During Therapy  [facility] for tasks assessed/performed       Past Medical History:  Diagnosis Date  . Cancer (HCC)    papillary carcinoma  . Diabetes mellitus without complication ([place])   . High cholesterol   . Hypertension   . Hypothyroidism   . Migraines   . Thyroid disease   . Ventricular tachycardia [facility])     Past Surgical History:  Procedure Laterality Date  . ABDOMINAL HYSTERECTOMY    . BREAST SURGERY     lumps removed - over 20 years ago  . THYROIDECTOMY    . TOTAL KNEE ARTHROPLASTY Right 12/18/2017   Procedure: RIGHT TOTAL KNEE ARTHROPLASTY;  Surgeon: Netta Cedars, MD;  Location: [place];  Service: Orthopedics;  Laterality: Right;    There were no vitals filed for this visit.  Subjective Assessment - 02/08/18 0930    Subjective  Pt. with complaint of sharp pain at R posterior knee and R calf muscle today which bothers her most when walking.  Has been performing HEP.      Pertinent History  R TKR 12/18/17    Patient Stated Goals  "to get the soreness out of my knee and walk normally again"    Currently in Pain?  Yes    Pain Score  3    rising to 5/10 sharp pain at times with walking    Pain Location  Knee     Pain Orientation  Right    Pain Descriptors / Indicators  Sharp    Pain Type  Surgical pain;Acute pain    Pain Onset  More than a month ago    Pain Frequency  Intermittent    Aggravating Factors   walking     Pain Relieving Factors  ice    Multiple Pain Sites  No                       [facility] Adult PT Treatment/Exercise - 02/08/18 0952      Knee/Hip Exercises: Stretches   Passive Hamstring Stretch  Right;1 rep;30 seconds    Passive Hamstring Stretch Limitations  manual with therapist     Gastroc Stretch  Right;30 seconds;2 reps    Gastroc Stretch Limitations  manual with therapist and into wall stanidng       Knee/Hip Exercises: Aerobic   Recumbent Bike  full revolutions x 6 min       Knee/Hip Exercises: Standing   Terminal Knee Extension  Right;20 reps;Theraband    Theraband Level (Terminal Knee Extension)  Level 4 (Blue)    Functional Squat  15 reps;3 seconds    Functional Squat Limitations  TRX+ heel raise at top of movement     Wall Squat  10 reps;3 seconds    Wall Squat Limitations  increased depth tolerated well today     SLS  R SLS 2 x 20 sec; intermittent support at counter     Other Standing Knee Exercises  Alternating toe-clears to 9" while standing on airex pad x 10 rpes each at counter       Knee/Hip Exercises: Supine   Bridges  Both   x 12 reps      Moist Heat Therapy   Number Minutes Moist Heat  15 Minutes    Moist Heat Location  --   R posterior distal HS, R calf      Cryotherapy   Number Minutes Cryotherapy  15 Minutes    Cryotherapy Location  Knee    Type of Cryotherapy  Ice pack      Manual Therapy   Manual Therapy  Soft tissue mobilization;Joint mobilization    Manual therapy comments  in prone hang position slight R knee flexion with therapist support     Joint Mobilization  R patellar mobs inferior/superior for improved ROM    Soft tissue mobilization  STM/DTM to proximal calf and distal HS in area of reported tenderness     Myofascial Release  TPR to R lateral HS               PT Short Term Goals - 01/15/18 1203      PT SHORT TERM GOAL #1   Title  Independent with initial HEP    Status  Achieved      PT SHORT TERM GOAL #2   Title  R knee AROM >/= 5-90 dg     Status  Achieved      PT SHORT TERM GOAL #3   Title  Pt will safety ambulate with SPC household and limited community distances to increase community access    Status  Achieved        PT Long Term Goals - 01/27/18 0930      PT LONG TERM GOAL #1   Title  Independent with ongoing/advanced HEP    Status  Partially Met    Target Date  02/24/18      PT LONG TERM GOAL #2   Title  R knee AROM >/= 3-110 dg     Status  Partially Met    Target Date  02/24/18      PT LONG TERM GOAL #3   Title  R hip and knee strength >/= 4+/5 for improved stability    Status  On-going    Target Date  02/24/18      PT LONG TERM GOAL #4   Title  Pt will ambulate with normal gait pattern with or w/o LRAD for safe community access    Status  On-going    Target Date  02/24/18      PT LONG TERM GOAL #5   Title  Pt will negotiate 1 flight of stairs reciprocally with normal step pattern to allow safe access to master bed and bathroom in home    Status  On-going    Target Date  02/24/18            Plan - 02/08/18 1101    Clinical Impression Statement  Terri Waters seen ambulating into session today with improved gait mechanics and reduced  trunk lean progressing toward LTG #4.  Pt. primary complaint today was R posterior thigh and calf pain proximal to R knee.  Pt. description of pain seemed "muscular" in nature and responded well to STM/DTM and manual stretching with therapist.  Terri Waters tolerated all strengthening and ROM activities in session today well focused on gentle calf/HS activation.  Ended visit with ice pack to R knee and moist heat applied to R calf and distal HS group for hopeful reduction in muscular tone.  Will monitor response to MT at upcoming  visit and continue to progress per pt.     Rehab Potential  Good    PT Frequency  2x / week    PT Duration  4 weeks    PT Treatment/Interventions  Patient/family education;Therapeutic exercise;Therapeutic activities;Functional mobility training;Gait training;Stair training;Balance training;Manual techniques;Passive range of motion;Scar mobilization;Taping;Dry needling;Electrical Stimulation;Cryotherapy;Vasopneumatic Device;ADLs/Self Care Home Management    PT Next Visit Plan  TKR protocol - Monitor response to R HS/GS STM/DTM; ROM with stretching & manual therapy as indicated, quad/HS & hip strengthening, gait training to normalize gait pattern; taping +/- vasopneumatic compression to manage edema/pain    Consulted and Agree with Plan of Care  Patient       Patient will benefit from skilled therapeutic intervention in order to improve the following deficits and impairments:  Pain, Decreased range of motion, Impaired flexibility, Decreased strength, Decreased scar mobility, Decreased mobility, Decreased activity tolerance, Difficulty walking, Abnormal gait, Decreased balance  Visit Diagnosis: Acute pain of right knee  Stiffness of right knee, not elsewhere classified  Muscle weakness (generalized)  Difficulty in walking, not elsewhere classified  Other abnormalities of gait and mobility  Localized edema     Problem List Patient Active Problem List   Diagnosis Date Noted  . 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 Migraine with aura and without status migrainosus, not intractable 04/09/2017    Terri Waters, Terri Waters 02/08/18 11:14 AM   [facility] [address]  [place], [place], [address] Phone: [phone]   Fax:  [phone]  Name: Terri Waters MRN: [id number] Date of Birth: 15-Sep-1955

## 2018-02-10 ENCOUNTER — Ambulatory Visit: Payer: 59 | Admitting: Physical Therapy

## 2018-02-10 ENCOUNTER — Encounter: Payer: Self-pay | Admitting: Physical Therapy

## 2018-02-10 DIAGNOSIS — M6281 Muscle weakness (generalized): Secondary | ICD-10-CM

## 2018-02-10 DIAGNOSIS — R262 Difficulty in walking, not elsewhere classified: Secondary | ICD-10-CM

## 2018-02-10 DIAGNOSIS — R600 Localized edema: Secondary | ICD-10-CM

## 2018-02-10 DIAGNOSIS — M25561 Pain in right knee: Secondary | ICD-10-CM

## 2018-02-10 DIAGNOSIS — R2689 Other abnormalities of gait and mobility: Secondary | ICD-10-CM

## 2018-02-10 DIAGNOSIS — M25661 Stiffness of right knee, not elsewhere classified: Secondary | ICD-10-CM

## 2018-02-10 NOTE — Therapy (Signed)
[facility] [address]  [place], [place], [address] Phone: [phone]   Fax:  [phone]  Physical Therapy Treatment  Patient Details  Name: Terri Waters MRN: [id number] Date of Birth: 09-Apr-1955 Referring Provider (PT): Netta Cedars, MD   Encounter Date: 02/10/2018  PT End of Session - 02/10/18 0845    Visit Number  17    Number of Visits  21    Date for PT Re-Evaluation  02/24/18    Authorization Type  [employer]    Authorization - Number of Visits  48   9 used prior to eval   PT Start Time  0845    PT Stop Time  0945    PT Time Calculation (min)  60 min    Activity Tolerance  Patient tolerated treatment well    Behavior During Therapy  [facility] for tasks assessed/performed       Past Medical History:  Diagnosis Date  . Cancer (HCC)    papillary carcinoma  . Diabetes mellitus without complication ([place])   . High cholesterol   . Hypertension   . Hypothyroidism   . Migraines   . Thyroid disease   . Ventricular tachycardia [facility])     Past Surgical History:  Procedure Laterality Date  . ABDOMINAL HYSTERECTOMY    . BREAST SURGERY     lumps removed - over 20 years ago  . THYROIDECTOMY    . TOTAL KNEE ARTHROPLASTY Right 12/18/2017   Procedure: RIGHT TOTAL KNEE ARTHROPLASTY;  Surgeon: Netta Cedars, MD;  Location: [facility];  Service: Orthopedics;  Laterality: Right;    There were no vitals filed for this visit.  Subjective Assessment - 02/10/18 0846    Subjective  Pt reporting she is still getting a painful "knot" at the back of her R knee with pain wrapping around lateral leg to anterior knee.    Pertinent History  R TKR 12/18/17    Patient Stated Goals  "to get the soreness out of my knee and walk normally again"    Currently in Pain?  Yes    Pain Score  4     Pain Location  Knee    Pain Orientation  Right    Pain Descriptors / Indicators  Sharp    Pain Frequency  Intermittent                        [facility] Adult PT Treatment/Exercise - 02/10/18 0845      Exercises   Exercises  Knee/Hip      Knee/Hip Exercises: Stretches   Passive Hamstring Stretch  Right;30 seconds;3 reps    Passive Hamstring Stretch Limitations  neutral & with LE IR/ER with strap at forefoot for added gastroc stretch      Knee/Hip Exercises: Aerobic   Recumbent Bike  L2 x 6 min      Knee/Hip Exercises: Supine   Knee Flexion  Both;AROM;20 reps    Knee Flexion Limitations  HS curl with heels on peanut ball with hold for stretch at end range flexion    Other Supine Knee/Hip Exercises  Straight leg bridge 10 x 5 sec    Other Supine Knee/Hip Exercises  Straight leg bridge + mini-HS curl x 10      Modalities   Modalities  Cryotherapy;Moist Heat      Moist Heat Therapy   Number Minutes Moist Heat  10 Minutes    Moist Heat Location  Knee  R posterior distal HS, R calf      Cryotherapy   Number Minutes Cryotherapy  10 Minutes    Cryotherapy Location  Knee    Type of Cryotherapy  Ice pack      Manual Therapy   Manual Therapy  Soft tissue mobilization;Myofascial release;Taping;Joint mobilization    Joint Mobilization  R inferior patellar mobs in flexion & tibiofemoral A/P mobs for improved flexion ROM, femorotibial A/P mobs fro extension ROM    Soft tissue mobilization  STM/DTM to R popliteus, distal lateral HS & proximal med/lat gastroc; medial R quads    Myofascial Release  TPR to R popliteus & VMO    [place];Inhibit Muscle      Kinesiotix   Create Space  R posterior knee - "X" pattern 25%    Inhibit Muscle   R VMO - "Y" strip 30% with anchor mid quad &tails wrapping around VMO               PT Short Term Goals - 01/15/18 1203      PT SHORT TERM GOAL #1   Title  Independent with initial HEP    Status  Achieved      PT SHORT TERM GOAL #2   Title  R knee AROM >/= 5-90 dg     Status  Achieved      PT SHORT TERM GOAL #3   Title  Pt will  safety ambulate with [place] household and limited community distances to increase community access    Status  Achieved        PT Long Term Goals - 01/27/18 0930      PT LONG TERM GOAL #1   Title  Independent with ongoing/advanced HEP    Status  Partially Met    Target Date  02/24/18      PT LONG TERM GOAL #2   Title  R knee AROM >/= 3-110 dg     Status  Partially Met    Target Date  02/24/18      PT LONG TERM GOAL #3   Title  R hip and knee strength >/= 4+/5 for improved stability    Status  On-going    Target Date  02/24/18      PT LONG TERM GOAL #4   Title  Pt will ambulate with normal gait pattern with or w/o LRAD for safe community access    Status  On-going    Target Date  02/24/18      PT LONG TERM GOAL #5   Title  Pt will negotiate 1 flight of stairs reciprocally with normal step pattern to allow safe access to master bed and bathroom in home    Status  On-going    Target Date  02/24/18            Plan - 02/10/18 0849    Clinical Impression Statement  Emmabelle reporting ongoing pain in R posterior knee with significant point tenderness over popliteus muscle along with increased muscle tension and ttp in lateral distal HS and medial/lateral proximal gastroc - limited tolerance for STM and manual TPR to R popliteus, therefore initiated trial of kinesiotape to posterior R knee to promote muscle relaxation and decreased pain as well as instructed in medial & lateral isolation of hamstring/gastroc stretch with pt reporting stretches were better able to isolate painful area. Another tender area (probable TP) identified in R VMO during inferior patellar glide with joint mobs for flexion ROM - pt  again with limited tolerance for STM/manual TPR, therefore taping applied to temporarily inhibit VMO to allow for release of abnormal muscle tension.     Rehab Potential  Good    PT Frequency  2x / week    PT Duration  4 weeks    PT Treatment/Interventions  Patient/family  education;Therapeutic exercise;Therapeutic activities;Functional mobility training;Gait training;Stair training;Balance training;Manual techniques;Passive range of motion;Scar mobilization;Taping;Dry needling;Electrical Stimulation;Cryotherapy;Vasopneumatic Device;ADLs/Self Care Home Management    PT Next Visit Plan  Assess response to taping; TKR protocol - ROM with stretching & manual therapy as indicated, quad/HS & hip strengthening, gait training to normalize gait pattern; taping +/- vasopneumatic compression to manage edema/pain    Consulted and Agree with Plan of Care  Patient       Patient will benefit from skilled therapeutic intervention in order to improve the following deficits and impairments:  Pain, Decreased range of motion, Impaired flexibility, Decreased strength, Decreased scar mobility, Decreased mobility, Decreased activity tolerance, Difficulty walking, Abnormal gait, Decreased balance  Visit Diagnosis: Acute pain of right knee  Stiffness of right knee, not elsewhere classified  Muscle weakness (generalized)  Difficulty in walking, not elsewhere classified  Other abnormalities of gait and mobility  Localized edema     Problem List Patient Active Problem List   Diagnosis Date Noted  . 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 Migraine with aura and without status migrainosus, not intractable 04/09/2017    Percival Spanish, PT, MPT 02/10/2018, 11:28 AM  [facility] [address]  [place], [place], [address] Phone: [phone]   Fax:  [phone]  Name: MACKENZEE BECVAR MRN: [id number] Date of Birth: [birth year]

## 2018-02-15 ENCOUNTER — Encounter: Payer: Self-pay | Admitting: Physical Therapy

## 2018-02-15 ENCOUNTER — Ambulatory Visit: Payer: 59 | Attending: Orthopedic Surgery | Admitting: Physical Therapy

## 2018-02-15 DIAGNOSIS — M6281 Muscle weakness (generalized): Secondary | ICD-10-CM

## 2018-02-15 DIAGNOSIS — R2689 Other abnormalities of gait and mobility: Secondary | ICD-10-CM | POA: Insufficient documentation

## 2018-02-15 DIAGNOSIS — M25561 Pain in right knee: Secondary | ICD-10-CM | POA: Diagnosis present

## 2018-02-15 DIAGNOSIS — M25661 Stiffness of right knee, not elsewhere classified: Secondary | ICD-10-CM | POA: Insufficient documentation

## 2018-02-15 DIAGNOSIS — R600 Localized edema: Secondary | ICD-10-CM | POA: Diagnosis present

## 2018-02-15 DIAGNOSIS — R262 Difficulty in walking, not elsewhere classified: Secondary | ICD-10-CM | POA: Diagnosis present

## 2018-02-15 NOTE — Therapy (Signed)
[facility] [address]  [place], [place], [address] Phone: [phone]   Fax:  [phone]  Physical Therapy Treatment  Patient Details  Name: Terri Waters MRN: [id number] Date of Birth: 1955/07/28 Referring Provider (PT): Netta Cedars, MD   Encounter Date: 02/15/2018  PT End of Session - 02/15/18 0801    Visit Number  18    Number of Visits  21    Date for PT Re-Evaluation  02/24/18    Authorization Type  UHC    Authorization - Number of Visits  48   9 used prior to eval   PT Start Time  0801    PT Stop Time  0849    PT Time Calculation (min)  48 min    Activity Tolerance  Patient tolerated treatment well    Behavior During Therapy  [facility] for tasks assessed/performed       Past Medical History:  Diagnosis Date  . Cancer (HCC)    papillary carcinoma  . Diabetes mellitus without complication ([place])   . High cholesterol   . Hypertension   . Hypothyroidism   . Migraines   . Thyroid disease   . Ventricular tachycardia [facility])     Past Surgical History:  Procedure Laterality Date  . ABDOMINAL HYSTERECTOMY    . BREAST SURGERY     lumps removed - over 20 years ago  . THYROIDECTOMY    . TOTAL KNEE ARTHROPLASTY Right 12/18/2017   Procedure: RIGHT TOTAL KNEE ARTHROPLASTY;  Surgeon: Netta Cedars, MD;  Location: [facility];  Service: Orthopedics;  Laterality: Right;    There were no vitals filed for this visit.  Subjective Assessment - 02/15/18 0803    Subjective  Pt reporting her knee has been really hurting over the weekend, up to "12/10" on Saturday - states knee "locked up" and feels like it has a tight band around the leg.    Pertinent History  R TKR 12/18/17    Patient Stated Goals  "to get the soreness out of my knee and walk normally again"    Currently in Pain?  Yes    Pain Score  3                        [facility] Adult PT Treatment/Exercise - 02/15/18 0801      Exercises   Exercises   Knee/Hip      Knee/Hip Exercises: Stretches   Passive Hamstring Stretch  Right;30 seconds;3 reps    Passive Hamstring Stretch Limitations  neutral & with LE IR/ER with strap at forefoot for added gastroc stretch      Knee/Hip Exercises: Aerobic   Recumbent Bike  L2-1 x 2 min - discontinued due to medial knee pain      Knee/Hip Exercises: Seated   Other Seated Knee/Hip Exercises  R Fitter leg press (1 black/2 blue) x 20    Hamstring Curl  Right;20 reps;Strengthening    Hamstring Limitations  green TB      Modalities   Modalities  Iontophoresis      Iontophoresis   Type of Iontophoresis  Dexamethasone    Location  L medial knee    Dose  80 mA-min, 1.0 mL    Time  4-6 hr patch (#1 of 6)      Manual Therapy   Manual Therapy  Soft tissue mobilization;Myofascial release;Other (comment)    Manual therapy comments  prone with knee flexed &  extended with foot over edge of mat table    Soft tissue mobilization  STM/DTM to R popliteus, distal medial HS & medial R quads    Myofascial Release  TPR to R popliteus & R medial HS    Other Manual Therapy  Instructed pt in use of rolling pin to HS and quads for self-STM             PT Education - 02/15/18 0845    Education Details  Role of DN & expeceted response to treatment; Ionto patch instructions    Person(s) Educated  Patient    Methods  Explanation;Handout    Comprehension  Verbalized understanding       PT Short Term Goals - 01/15/18 1203      PT SHORT TERM GOAL #1   Title  Independent with initial HEP    Status  Achieved      PT SHORT TERM GOAL #2   Title  R knee AROM >/= 5-90 dg     Status  Achieved      PT SHORT TERM GOAL #3   Title  Pt will safety ambulate with [facility] household and limited community distances to increase community access    Status  Achieved        PT Long Term Goals - 01/27/18 0930      PT LONG TERM GOAL #1   Title  Independent with ongoing/advanced HEP    Status  Partially Met    Target Date   02/24/18      PT LONG TERM GOAL #2   Title  R knee AROM >/= 3-110 dg     Status  Partially Met    Target Date  02/24/18      PT LONG TERM GOAL #3   Title  R hip and knee strength >/= 4+/5 for improved stability    Status  On-going    Target Date  02/24/18      PT LONG TERM GOAL #4   Title  Pt will ambulate with normal gait pattern with or w/o LRAD for safe community access    Status  On-going    Target Date  02/24/18      PT LONG TERM GOAL #5   Title  Pt will negotiate 1 flight of stairs reciprocally with normal step pattern to allow safe access to master bed and bathroom in home    Status  On-going    Target Date  02/24/18            Plan - 02/15/18 0806    Clinical Impression Statement  Talasia reporting some relief from taping to posterior knee but did not note much benefit from VMO taping - still with biggest c/o of R medial knee pain. Ongoing taut bands and TPs presist in VMO and medial HS which demonstrate limited response to manual therapy today - may benefit from DN, but pt requating to wait until next visit as she has a busy day today. Initiated triual of iontophoresis for medial knee pain and will assess response on next visit.    Rehab Potential  Good    PT Frequency  2x / week    PT Duration  4 weeks    PT Treatment/Interventions  Patient/family education;Therapeutic exercise;Therapeutic activities;Functional mobility training;Gait training;Stair training;Balance training;Manual techniques;Passive range of motion;Scar mobilization;Taping;Dry needling;Electrical Stimulation;Cryotherapy;Vasopneumatic Device;ADLs/Self Care Home Management    PT Next Visit Plan  Assess response to ionto patch; DN as indicated for ongoing TPs?;TKR protocol - ROM  with stretching & manual therapy as indicated, quad/HS & hip strengthening, gait training to normalize gait pattern; taping +/- vasopneumatic compression to manage edema/pain    Consulted and Agree with Plan of Care  Patient        Patient will benefit from skilled therapeutic intervention in order to improve the following deficits and impairments:  Pain, Decreased range of motion, Impaired flexibility, Decreased strength, Decreased scar mobility, Decreased mobility, Decreased activity tolerance, Difficulty walking, Abnormal gait, Decreased balance  Visit Diagnosis: Acute pain of right knee  Stiffness of right knee, not elsewhere classified  Muscle weakness (generalized)  Difficulty in walking, not elsewhere classified  Other abnormalities of gait and mobility  Localized edema     Problem List Patient Active Problem List   Diagnosis Date Noted  . 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 Migraine with aura and without status migrainosus, not intractable 04/09/2017    Percival Spanish, PT, MPT 02/15/2018, 9:09 AM  [facility] [address]  [place], [place], [address] Phone: [phone]   Fax:  [phone]  Name: ELIZEBATH WEVER MRN: [id number] Date of Birth: 1955/09/08

## 2018-02-15 NOTE — Patient Instructions (Addendum)
Trigger Point Dry Needling  . What is Trigger Point Dry Needling (DN)? o DN is a physical therapy technique used to treat muscle pain and dysfunction. Specifically, DN helps deactivate muscle trigger points (muscle knots).  o A thin filiform needle is used to penetrate the skin and stimulate the underlying trigger point. The goal is for a local twitch response (LTR) to occur and for the trigger point to relax. No medication of any kind is injected during the procedure.   . What Does Trigger Point Dry Needling Feel Like?  o The procedure feels different for each individual patient. Some patients report that they do not actually feel the needle enter the skin and overall the process is not painful. Very mild bleeding may occur. However, many patients feel a deep cramping in the muscle in which the needle was inserted. This is the local twitch response.   Mar[place] Kitchen How Will I feel after the treatment? o Soreness is normal, and the onset of soreness may not occur for a few hours. Typically this soreness does not last longer than two days.  o Bruising is uncommon, however; ice can be used to decrease any possible bruising.  o In rare cases feeling tired or nauseous after the treatment is normal. In addition, your symptoms may get worse before they get better, this period will typically not last longer than 24 hours.   . What Can I do After My Treatment? o Increase your hydration by drinking more water for the next 24 hours. o You may place ice or heat on the areas treated that have become sore, however, do not use heat on inflamed or bruised areas. Heat often brings more relief post needling. o You can continue your regular activities, but vigorous activity is not recommended initially after the treatment for 24 hours. o DN is best combined with other physical therapy such as strengthening, stretching, and other therapies.     IONTOPHORESIS PATIENT PRECAUTIONS & CONTRAINDICATIONS:  . Redness under one or  both electrodes can occur.  This characterized by a uniform redness that usually disappears within 12 hours of treatment. . Small pinhead size blisters may result in response to the drug.  Contact your physician if the problem persists more than 24 hours. . On rare occasions, iontophoresis therapy can result in temporary skin reactions such as rash, inflammation, irritation or burns.  The skin reactions may be the result of individual sensitivity to the ionic solution used, the condition of the skin at the start of treatment, reaction to the materials in the electrodes, allergies or sensitivity to dexamethasone, or a poor connection between the patch and your skin.  Discontinue using iontophoresis if you have any of these reactions and report to your therapist. . Remove the Patch or electrodes if you have any undue sensation of pain or burning during the treatment and report discomfort to your therapist. . Tell your Therapist if you have had known adverse reactions to the application of electrical current. Marland Kitchen Approximate treatment time is 4-6 hours.  Remove the patch after 6 hours. . The Patch can be worn during normal activity, however excessive motion where the electrodes have been placed can cause poor contact between the skin and the electrode or uneven electrical current resulting in greater risk of skin irritation. Marland Kitchen Keep out of the reach of children.   . DO NOT use if you have a cardiac pacemaker or any other electrically sensitive implanted device. . DO NOT use if you have a  known sensitivity to dexamethasone. . DO NOT use during Magnetic Resonance Imaging (MRI). . DO NOT use over broken or compromised skin (e.g. sunburn, cuts, or acne) due to the increased risk of skin reaction. . DO NOT SHAVE over the area to be treated:  To establish good contact between the Patch and the skin, excessive hair may be clipped. . DO NOT place the Patch or electrodes on or over your eyes, directly over your  heart, or brain. . DO NOT reuse the Patch or electrodes as this may cause burns to occur.

## 2018-02-17 ENCOUNTER — Ambulatory Visit: Payer: 59 | Admitting: Physical Therapy

## 2018-02-17 ENCOUNTER — Encounter: Payer: Self-pay | Admitting: Physical Therapy

## 2018-02-17 DIAGNOSIS — R2689 Other abnormalities of gait and mobility: Secondary | ICD-10-CM

## 2018-02-17 DIAGNOSIS — M6281 Muscle weakness (generalized): Secondary | ICD-10-CM

## 2018-02-17 DIAGNOSIS — R262 Difficulty in walking, not elsewhere classified: Secondary | ICD-10-CM

## 2018-02-17 DIAGNOSIS — R600 Localized edema: Secondary | ICD-10-CM

## 2018-02-17 DIAGNOSIS — M25561 Pain in right knee: Secondary | ICD-10-CM

## 2018-02-17 DIAGNOSIS — M25661 Stiffness of right knee, not elsewhere classified: Secondary | ICD-10-CM

## 2018-02-17 NOTE — Therapy (Addendum)
[facility] [address]  [place], [place], [address] Phone: [phone]   Fax:  [phone]  Physical Therapy Treatment  Patient Details  Name: Terri Waters MRN: [id number] Date of Birth: [birth year] Referring Provider (PT): Netta Cedars, MD   Encounter Date: 02/17/2018  PT End of Session - 02/17/18 0801    Visit Number  19    Number of Visits  21    Date for PT Re-Evaluation  02/24/18    Authorization Type  U[employer]    Authorization - Number of Visits  48   9 used prior to eval   PT Start Time  0801    PT Stop Time  0849    PT Time Calculation (min)  48 min    Activity Tolerance  Patient tolerated treatment well    Behavior During Therapy  [facility] for tasks assessed/performed       Past Medical History:  Diagnosis Date  . Cancer (HCC)    papillary carcinoma  . Diabetes mellitus without complication ([place])   . High cholesterol   . Hypertension   . Hypothyroidism   . Migraines   . Thyroid disease   . Ventricular tachycardia [facility])     Past Surgical History:  Procedure Laterality Date  . ABDOMINAL HYSTERECTOMY    . BREAST SURGERY     lumps removed - over 20 years ago  . THYROIDECTOMY    . TOTAL KNEE ARTHROPLASTY Right 12/18/2017   Procedure: RIGHT TOTAL KNEE ARTHROPLASTY;  Surgeon: Netta Cedars, MD;  Location: [facility];  Service: Orthopedics;  Laterality: Right;    There were no vitals filed for this visit.  Subjective Assessment - 02/17/18 0804    Subjective  Pt reporting relief from ionto patch lasting until midday the next day, but pain has since returned.    Pertinent History  R TKR 12/18/17    Patient Stated Goals  "to get the soreness out of my knee and walk normally again"    Currently in Pain?  Yes    Pain Score  4     Pain Location  Knee    Pain Orientation  Right;Medial    Pain Descriptors / Indicators  Sharp    Pain Type  Surgical pain;Acute pain    Pain Frequency  Intermittent                        [facility] Adult PT Treatment/Exercise - 02/17/18 0801      Exercises   Exercises  Knee/Hip      Knee/Hip Exercises: Stretches   Passive Hamstring Stretch  Right;30 seconds;3 reps    Passive Hamstring Stretch Limitations  seated hip hinge neutral & with LE IR/ER with strap at forefoot for added gastroc stretch    Gastroc Stretch  Right;30 seconds;2 reps   each   Gastroc Stretch Limitations  toes curled on baseboard & lunge position at wall      Knee/Hip Exercises: Aerobic   Recumbent Bike  L1 x 6 min      Knee/Hip Exercises: Seated   Hamstring Curl  Right;20 reps;Strengthening    Hamstring Limitations  green TB      Modalities   Modalities  Iontophoresis      Iontophoresis   Type of Iontophoresis  Dexamethasone    Location  L medial knee    Dose  80 mA-min, 1.0 mL    Time  4-6 hr patch (#2 of  6)      Manual Therapy   Manual Therapy  Soft tissue mobilization;Myofascial release    Manual therapy comments  prone & supine    Soft tissue mobilization  STM/DTM to R distal medial HS, hip adductors & medial R quads    Myofascial Release  manual TPR to STM/DTM to R distal medial HS, R adductors & R VMO       Trigger Point Dry Needling - 02/17/18 0801    Consent Given?  Yes    Education Handout Provided  Yes    Muscles Treated Lower Body  Hamstring    Hamstring Response  Twitch response elicited;Palpable increased muscle length   R distal medial HS          PT Education - 02/17/18 0811    Education Details  Review of purpose of DN, expected response to treatment & recommendations for post-treatment activity    Person(s) Educated  Patient    Methods  Explanation    Comprehension  Verbalized understanding       PT Short Term Goals - 01/15/18 1203      PT SHORT TERM GOAL #1   Title  Independent with initial HEP    Status  Achieved      PT SHORT TERM GOAL #2   Title  R knee AROM >/= 5-90 dg     Status  Achieved      PT SHORT TERM  GOAL #3   Title  Pt will safety ambulate with [place] household and limited community distances to increase community access    Status  Achieved        PT Long Term Goals - 01/27/18 0930      PT LONG TERM GOAL #1   Title  Independent with ongoing/advanced HEP    Status  Partially Met    Target Date  02/24/18      PT LONG TERM GOAL #2   Title  R knee AROM >/= 3-110 dg     Status  Partially Met    Target Date  02/24/18      PT LONG TERM GOAL #3   Title  R hip and knee strength >/= 4+/5 for improved stability    Status  On-going    Target Date  02/24/18      PT LONG TERM GOAL #4   Title  Pt will ambulate with normal gait pattern with or w/o LRAD for safe community access    Status  On-going    Target Date  02/24/18      PT LONG TERM GOAL #5   Title  Pt will negotiate 1 flight of stairs reciprocally with normal step pattern to allow safe access to master bed and bathroom in home    Status  On-going    Target Date  02/24/18            Plan - 02/17/18 0801    Clinical Impression Statement  C[place] noting relief from ionto patch lasting into the next day, but now still experiencing "lumps" (TPs) in hamstrings, gastroc and VMO. Pt continues to have limited tolerance for deep pressure with manual STM/MFR, therefore initiated trial of DN R distal medial HS upon informed pt consent - positive twitch response elicited with palpable reduction in tension and ttp at level treated, with pt noting less pain with walking afterwards. Additonal TPs also identified in medial HS, proximal gastroc & VMO, but pt wishing to defer further DN today - may consider on  next visit pending long term response to initial DN. Manual & DN followed by stretching and strengtnhening to further normalize muscle tension and treatment concluded with 2nd ionto patch application given benefit noted from initial application.    Rehab Potential  Good    PT Frequency  2x / week    PT Duration  4 weeks    PT  Treatment/Interventions  Patient/family education;Therapeutic exercise;Therapeutic activities;Functional mobility training;Gait training;Stair training;Balance training;Manual techniques;Passive range of motion;Scar mobilization;Taping;Dry needling;Electrical Stimulation;Cryotherapy;Vasopneumatic Device;ADLs/Self Care Home Management    PT Next Visit Plan  MD progress note for appt 02/23/18; DN as indicated for ongoing TPs;TKR protocol - ROM with stretching & manual therapy as indicated, quad/HS & hip strengthening, gait training to normalize gait pattern; taping +/- vasopneumatic compression to manage edema/pain; Modalities including ionto PRN for pain    Consulted and Agree with Plan of Care  Patient       Patient will benefit from skilled therapeutic intervention in order to improve the following deficits and impairments:  Pain, Decreased range of motion, Impaired flexibility, Decreased strength, Decreased scar mobility, Decreased mobility, Decreased activity tolerance, Difficulty walking, Abnormal gait, Decreased balance  Visit Diagnosis: Acute pain of right knee  Stiffness of right knee, not elsewhere classified  Muscle weakness (generalized)  Difficulty in walking, not elsewhere classified  Other abnormalities of gait and mobility  Localized edema     Problem List Patient Active Problem List   Diagnosis Date Noted  . 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 Migraine with aura and without status migrainosus, not intractable 04/09/2017    Percival Spanish, PT, MPT 02/17/2018, 10:25 AM  [facility] [address]  [place], [place], [address] Phone: [phone]   Fax:  [phone]  Name: JANIAH DEVINNEY MRN: [id number] Date of Birth: 04/28/1955

## 2018-02-22 ENCOUNTER — Ambulatory Visit: Payer: 59 | Admitting: Physical Therapy

## 2018-02-22 ENCOUNTER — Encounter: Payer: Self-pay | Admitting: Physical Therapy

## 2018-02-22 DIAGNOSIS — R262 Difficulty in walking, not elsewhere classified: Secondary | ICD-10-CM

## 2018-02-22 DIAGNOSIS — M25561 Pain in right knee: Secondary | ICD-10-CM

## 2018-02-22 DIAGNOSIS — R600 Localized edema: Secondary | ICD-10-CM

## 2018-02-22 DIAGNOSIS — M6281 Muscle weakness (generalized): Secondary | ICD-10-CM

## 2018-02-22 DIAGNOSIS — M25661 Stiffness of right knee, not elsewhere classified: Secondary | ICD-10-CM

## 2018-02-22 DIAGNOSIS — R2689 Other abnormalities of gait and mobility: Secondary | ICD-10-CM

## 2018-02-22 NOTE — Therapy (Addendum)
[facility] [address]  [place], [place], [address] Phone: [phone]   Fax:  [phone]  Physical Therapy Treatment / Recert / Discharge Summary  Patient Details  Name: Terri Waters MRN: [id number] Date of Birth: 1955-02-14 Referring Provider (PT): Netta Cedars, MD   Encounter Date: 02/22/2018  PT End of Session - 02/22/18 0845    Visit Number  20    Number of Visits  28    Date for PT Re-Evaluation  03/22/18    Authorization Type  UHC    Authorization - Number of Visits  60   total combined for PT, OT & ST per year   PT Start Time  0845    PT Stop Time  0936    PT Time Calculation (min)  51 min    Activity Tolerance  Patient tolerated treatment well    Behavior During Therapy  [facility] for tasks assessed/performed       Past Medical History:  Diagnosis Date  . Cancer (HCC)    papillary carcinoma  . Diabetes mellitus without complication ([place])   . High cholesterol   . Hypertension   . Hypothyroidism   . Migraines   . Thyroid disease   . Ventricular tachycardia [facility])     Past Surgical History:  Procedure Laterality Date  . ABDOMINAL HYSTERECTOMY    . BREAST SURGERY     lumps removed - over 20 years ago  . THYROIDECTOMY    . TOTAL KNEE ARTHROPLASTY Right 12/18/2017   Procedure: RIGHT TOTAL KNEE ARTHROPLASTY;  Surgeon: Netta Cedars, MD;  Location: [facility];  Service: Orthopedics;  Laterality: Right;    There were no vitals filed for this visit.  Subjective Assessment - 02/22/18 0847    Subjective  Pt reporting good relief from DN and ionto patch last visit lasting 2 days. Noting increased discomfort by Friday/Saturday, but reports walking up 2 flights of stairs at the hospital while visiting a friend and more walking on Saturday. Pt still noting poor sleep tolerance due to waking up with knee pain and stiffness.    Pertinent History  R TKR 12/18/17    Limitations  Sitting;Standing;Walking;House hold  activities    How long can you sit comfortably?  75 minutes    How long can you stand comfortably?  30 mnutes    How long can you walk comfortably?  10-15 minutes    Patient Stated Goals  "to get the soreness out of my knee and walk normally again"    Pain Score  3     Pain Location  Knee    Pain Orientation  Right;Medial    Pain Descriptors / Indicators  Aching    Pain Type  Surgical pain;Acute pain    Pain Frequency  Intermittent         [facility] PT Assessment - 02/22/18 0845      Assessment   Medical Diagnosis  R TKR    Referring Provider (PT)  Netta Cedars, MD    Onset Date/Surgical Date  12/18/17    Next MD Visit  02/23/18      Prior Function   Level of Independence  Independent    Vocation  Full time employment   currently not working due to surgery   Conservator, 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)    Leisure  read, [employer], daily water aerobics at [employer] prior to surgery  AROM   Right Knee Extension  2    Right Knee Flexion  109      PROM   Right Knee Extension  0    Right Knee Flexion  109      Strength   Right Hip Flexion  4/5    Right Hip Extension  4/5    Right Hip ABduction  4/5    Right Hip ADduction  4/5    Left Hip Flexion  4+/5    Left Hip Extension  4/5    Left Hip ABduction  4/5    Left Hip ADduction  4+/5    Right Knee Flexion  4/5    Right Knee Extension  4+/5    Left Knee Flexion  5/5    Left Knee Extension  5/5      Ambulation/Gait   Ambulation/Gait Assistance  7: Independent    Ambulation Distance (Feet)  150 Feet    Assistive device  None    Gait Pattern  Step-through pattern;Antalgic;Decreased weight shift to right;Decreased stance time - right    Stairs Assistance  6: Modified independent (Device/Increase time)    Stair Management Technique  One rail Right;Alternating pattern;Forwards;Step to pattern;Sideways    Number of Stairs  14    Height of Stairs  7    Gait Comments  Able to ascend stairs  reciprocally with good step pattern but revert to step-to or sideways step on descent due to limited eccentric control.                   [facility] Adult PT Treatment/Exercise - 02/22/18 0845      Self-Care   Self-Care  Posture    Posture  Discussed sleeping position and discovered pt with tendiency to end up with R LE in hip IR and knee valgus - instructed pt in placement of pillow to help maintain more neutral R LE alignment while sleeping.      Exercises   Exercises  Knee/Hip      Knee/Hip Exercises: Aerobic   Recumbent Bike  L2 x 6 min      Knee/Hip Exercises: Machines for Strengthening   Cybex Leg Press  B LE 25# x 15; R LE 15# x15      Knee/Hip Exercises: Standing   Step Down  Right;10 reps;Step Height: 6";Hand Hold: 2;2 sets    Step Down Limitations  lateral & fwd eccentiric lowering with light heel touch    Wall Squat  15 reps;3 seconds      Modalities   Modalities  Iontophoresis      Iontophoresis   Type of Iontophoresis  Dexamethasone    Location  L medial knee    Dose  80 mA-min, 1.0 mL    Time  4-6 hr patch (#3 of 6)      Manual Therapy   Manual Therapy  Joint mobilization;Passive ROM    Joint Mobilization  R inferior patellar mobs in flexion & tibiofemoral A/P mobs for improved flexion ROM, femorotibial A/P mobs for extension ROM               PT Short Term Goals - 01/15/18 1203      PT SHORT TERM GOAL #1   Title  Independent with initial HEP    Status  Achieved      PT SHORT TERM GOAL #2   Title  R knee AROM >/= 5-90 dg     Status  Achieved      PT  SHORT TERM GOAL #3   Title  Pt will safety ambulate with [place] household and limited community distances to increase community access    Status  Achieved        PT Long Term Goals - 02/22/18 0853      PT LONG TERM GOAL #1   Title  Independent with ongoing/advanced HEP    Status  Partially Met    Target Date  03/22/18      PT LONG TERM GOAL #2   Title  R knee AROM >/= 3-110 dg      Status  Partially Met    Target Date  03/22/18      PT LONG TERM GOAL #3   Title  R hip and knee strength >/= 4+/5 for improved stability    Status  Partially Met    Target Date  03/22/18      PT LONG TERM GOAL #4   Title  Pt will ambulate with normal gait pattern with or w/o LRAD for safe community access    Status  On-going    Target Date  03/22/18      PT LONG TERM GOAL #5   Title  Pt will negotiate 1 flight of stairs reciprocally with normal step pattern to allow safe access to master bed and bathroom in home    Status  Partially Met    Target Date  03/22/18            Plan - 02/22/18 0852    Clinical Impression Statement  Terri Waters reporting good pain relief from DN and ionto patch last visit lasting 2 days, with pain worsening over the weekend with increased walking and stairs. Pt mostly ambulating w/o SPC at present but still demonstrating slight antalgic gait on R, favoring R LE with decreased weight shift to R and stance time on R. She is able to ascend stairs reciprocally with good step pattern but continues to rely on step-to or sideways approach to stair descent due to pain and limited eccentric control. Still limited with sleep due to pain and stiffness but discovered pt sleeping in position creating excessive valgus force on knee - instruction provided for use of pillow(s) to maintain neutral knee and hip alignment. R knee AROM currently 2-109 dg with hip and knee strength grossly 4/5. Recent focus on ROM and strengthening has been limited by ongoing pain issues, but somewhat better tolerance today following manual therapy including DN as well as ionto patches on recent visits. LTGs only partially met at this time. [place] wishing to continue PT to further address ongoing pain issues as well as maximize functional R knee ROM and strength in R LE, therefore recommend recert for 2x/wk for up to 4 more weeks.    Rehab Potential  Good    PT Frequency  2x / week    PT Duration  4  weeks   up to 4 wks   PT Treatment/Interventions  Patient/family education;Therapeutic exercise;Therapeutic activities;Functional mobility training;Gait training;Stair training;Balance training;Manual techniques;Passive range of motion;Scar mobilization;Taping;Dry needling;Electrical Stimulation;Cryotherapy;Vasopneumatic Device;ADLs/Self Care Home Management;Iontophoresis 71m/ml Dexamethasone    PT Next Visit Plan  FOTO (missed on 20th visit); DN as indicated for ongoing TPs; TKR protocol - ROM with stretching & manual therapy as indicated, quad/HS & hip strengthening, gait training to normalize gait pattern; taping +/- vasopneumatic compression to manage edema/pain; Modalities including ionto PRN for pain    Consulted and Agree with Plan of Care  Patient       Patient  will benefit from skilled therapeutic intervention in order to improve the following deficits and impairments:  Pain, Decreased range of motion, Impaired flexibility, Decreased strength, Decreased scar mobility, Decreased mobility, Decreased activity tolerance, Difficulty walking, Abnormal gait, Decreased balance  Visit Diagnosis: Acute pain of right knee  Stiffness of right knee, not elsewhere classified  Muscle weakness (generalized)  Difficulty in walking, not elsewhere classified  Other abnormalities of gait and mobility  Localized edema     Problem List Patient Active Problem List   Diagnosis Date Noted  . 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 Migraine with aura and without status migrainosus, not intractable 04/09/2017    Percival Spanish, PT, MPT 02/22/2018, 1:33 PM  [facility] [address]  [place], [place], [address] Phone: [phone]   Fax:  [phone]  Name: Terri Waters MRN: [id number] Date of Birth: 06/05/1955  PHYSICAL THERAPY DISCHARGE SUMMARY  Visits from Start of Care: 20  Current functional level related  to goals / functional outcomes:   Refer to above clinical impression for status as of last visit on 02/22/2018. Patient was recerted for additional PT as of above visit, then cancelled all additional visits due to financial reasons. New orders received and patient scheduled for re-eval on 03/17/2018 but showed up at wrong time and did not want to come back at scheduled time. Rescheduled for today, 03/23/2018, but no showed for appointment. When called regarding no show, she stated she had a work conflict and wants to hold off on PT for now, therefore will proceed with discharge from PT for this episode as patient is now 30 days since last completed visit. If she chooses to return, will complete new eval.   Remaining deficits:   As above.   Education / Equipment:   HEP  Plan: Patient agrees to discharge.  Patient goals were partially met. Patient is being discharged due to not returning since the last visit.  ?????     Percival Spanish, PT, MPT 03/23/18, 3:15 PM  [facility] [address]  [place], [place], [address] Phone: [phone]   Fax:  [phone]

## 2018-03-01 ENCOUNTER — Ambulatory Visit: Payer: 59

## 2018-03-15 ENCOUNTER — Encounter: Payer: 59 | Admitting: Physical Therapy

## 2018-03-17 ENCOUNTER — Ambulatory Visit: Payer: 59 | Admitting: Physical Therapy

## 2018-03-23 ENCOUNTER — Ambulatory Visit: Payer: 59 | Attending: Physician Assistant | Admitting: Physical Therapy

## 2018-03-25 ENCOUNTER — Encounter: Payer: 59 | Admitting: Physical Therapy

## 2019-06-23 ENCOUNTER — Observation Stay (HOSPITAL_COMMUNITY): Payer: 59

## 2019-06-23 ENCOUNTER — Other Ambulatory Visit: Payer: Self-pay

## 2019-06-23 ENCOUNTER — Observation Stay (HOSPITAL_BASED_OUTPATIENT_CLINIC_OR_DEPARTMENT_OTHER)
Admission: AD | Admit: 2019-06-23 | Discharge: 2019-06-24 | Disposition: A | Discharge status: Home / Self Care | Payer: 59 | Attending: Family Medicine | Admitting: Family Medicine

## 2019-06-23 ENCOUNTER — Emergency Department (HOSPITAL_COMMUNITY): Payer: 59

## 2019-06-23 ENCOUNTER — Emergency Department (HOSPITAL_BASED_OUTPATIENT_CLINIC_OR_DEPARTMENT_OTHER): Payer: 59

## 2019-06-23 ENCOUNTER — Encounter (HOSPITAL_BASED_OUTPATIENT_CLINIC_OR_DEPARTMENT_OTHER): Payer: Self-pay | Admitting: Emergency Medicine

## 2019-06-23 DIAGNOSIS — E119 Type 2 diabetes mellitus without complications: Secondary | ICD-10-CM | POA: Diagnosis not present

## 2019-06-23 DIAGNOSIS — Z7984 Long term (current) use of oral hypoglycemic drugs: Secondary | ICD-10-CM | POA: Insufficient documentation

## 2019-06-23 DIAGNOSIS — Z7982 Long term (current) use of aspirin: Secondary | ICD-10-CM | POA: Diagnosis not present

## 2019-06-23 DIAGNOSIS — I639 Cerebral infarction, unspecified: Secondary | ICD-10-CM | POA: Diagnosis not present

## 2019-06-23 DIAGNOSIS — E1140 Type 2 diabetes mellitus with diabetic neuropathy, unspecified: Secondary | ICD-10-CM | POA: Diagnosis not present

## 2019-06-23 DIAGNOSIS — Z20822 Contact with and (suspected) exposure to covid-19: Secondary | ICD-10-CM | POA: Insufficient documentation

## 2019-06-23 DIAGNOSIS — Z96651 Presence of right artificial knee joint: Secondary | ICD-10-CM | POA: Diagnosis not present

## 2019-06-23 DIAGNOSIS — Z85850 Personal history of malignant neoplasm of thyroid: Secondary | ICD-10-CM | POA: Insufficient documentation

## 2019-06-23 DIAGNOSIS — R42 Dizziness and giddiness: Secondary | ICD-10-CM | POA: Diagnosis present

## 2019-06-23 DIAGNOSIS — E782 Mixed hyperlipidemia: Secondary | ICD-10-CM | POA: Diagnosis not present

## 2019-06-23 DIAGNOSIS — Z79899 Other long term (current) drug therapy: Secondary | ICD-10-CM | POA: Diagnosis not present

## 2019-06-23 DIAGNOSIS — D352 Benign neoplasm of pituitary gland: Secondary | ICD-10-CM | POA: Insufficient documentation

## 2019-06-23 DIAGNOSIS — R110 Nausea: Secondary | ICD-10-CM | POA: Insufficient documentation

## 2019-06-23 DIAGNOSIS — I10 Essential (primary) hypertension: Secondary | ICD-10-CM | POA: Insufficient documentation

## 2019-06-23 LAB — COMPREHENSIVE METABOLIC PANEL
ALT: 16 U/L (ref 0–44)
AST: 14 U/L — ABNORMAL LOW (ref 15–41)
Albumin: 3.7 g/dL (ref 3.5–5.0)
Alkaline Phosphatase: 109 U/L (ref 38–126)
Anion gap: 11 (ref 5–15)
BUN: 14 mg/dL (ref 8–23)
CO2: 30 mmol/L (ref 22–32)
Calcium: 8.7 mg/dL — ABNORMAL LOW (ref 8.9–10.3)
Chloride: 98 mmol/L (ref 98–111)
Creatinine, Ser: 1.04 mg/dL — ABNORMAL HIGH (ref 0.44–1.00)
GFR calc Af Amer: >60 mL/min (ref >60)
GFR calc non Af Amer: 57 mL/min — ABNORMAL LOW (ref >60)
Glucose, Bld: 167 mg/dL — ABNORMAL HIGH (ref 70–99)
Potassium: 3.7 mmol/L (ref 3.5–5.1)
Sodium: 139 mmol/L (ref 135–145)
Total Bilirubin: 0.5 mg/dL (ref 0.3–1.2)
Total Protein: 7.3 g/dL (ref 6.5–8.1)

## 2019-06-23 LAB — URINALYSIS, ROUTINE W REFLEX MICROSCOPIC
Bilirubin Urine: NEGATIVE
Glucose, UA: NEGATIVE mg/dL
Hgb urine dipstick: NEGATIVE
Ketones, ur: NEGATIVE mg/dL
Nitrite: NEGATIVE
Protein, ur: NEGATIVE mg/dL
Specific Gravity, Urine: 1.025 (ref 1.005–1.030)
pH: 5.5 (ref 5.0–8.0)

## 2019-06-23 LAB — URINALYSIS, MICROSCOPIC (REFLEX): RBC / HPF: NONE SEEN RBC/hpf (ref 0–5)

## 2019-06-23 LAB — CBG MONITORING, ED: Glucose-Capillary: 145 mg/dL — ABNORMAL HIGH (ref 70–99)

## 2019-06-23 LAB — CBC WITH DIFFERENTIAL/PLATELET
Abs Immature Granulocytes: 0.01 10*3/uL (ref 0.00–0.07)
Basophils Absolute: 0 10*3/uL (ref 0.0–0.1)
Basophils Relative: 0 %
Eosinophils Absolute: 0.1 10*3/uL (ref 0.0–0.5)
Eosinophils Relative: 1 %
HCT: 37.6 % (ref 36.0–46.0)
Hemoglobin: 12 g/dL (ref 12.0–15.0)
Immature Granulocytes: 0 %
Lymphocytes Relative: 39 %
Lymphs Abs: 2.7 10*3/uL (ref 0.7–4.0)
MCH: 27.8 pg (ref 26.0–34.0)
MCHC: 31.9 g/dL (ref 30.0–36.0)
MCV: 87.2 fL (ref 80.0–100.0)
Monocytes Absolute: 0.5 10*3/uL (ref 0.1–1.0)
Monocytes Relative: 8 %
Neutro Abs: 3.6 10*3/uL (ref 1.7–7.7)
Neutrophils Relative %: 52 %
Platelets: 347 10*3/uL (ref 150–400)
RBC: 4.31 MIL/uL (ref 3.87–5.11)
RDW: 15.7 % — ABNORMAL HIGH (ref 11.5–15.5)
WBC: 6.9 10*3/uL (ref 4.0–10.5)
nRBC: 0 % (ref 0.0–0.2)

## 2019-06-23 LAB — HIV ANTIBODY (ROUTINE TESTING W REFLEX): HIV Screen 4th Generation wRfx: NONREACTIVE

## 2019-06-23 LAB — GLUCOSE, CAPILLARY
Glucose-Capillary: 139 mg/dL — ABNORMAL HIGH (ref 70–99)
Glucose-Capillary: 86 mg/dL (ref 70–99)

## 2019-06-23 LAB — SARS CORONAVIRUS 2 BY RT PCR (HOSPITAL ORDER, PERFORMED IN ~~LOC~~ HOSPITAL LAB): SARS Coronavirus 2: NEGATIVE
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
## 2019-06-23 IMAGING — MR MR HEAD W/O CM
6 of 11 series · 24 of 48 positions shown · non-contrast
Comparison: CT angiogram head/neck performed earlier the same day
[DATE], MRI/MRA head [DATE]

CLINICAL DATA: Ataxia, stroke suspected. Additional history
provided: New onset dizziness.

EXAM:
MRI HEAD WITHOUT CONTRAST
TECHNIQUE: Multiplanar, multiecho pulse sequences of the brain and surrounding
structures were obtained without intravenous contrast.

[Series 2: DWI · axial · 3.0mm · 0.94mm/px · z∈[-99,+46]mm · 7 of 100 slices shown (1 of 2)]
[im 1/100]
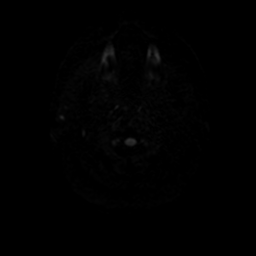
[im 17/100]
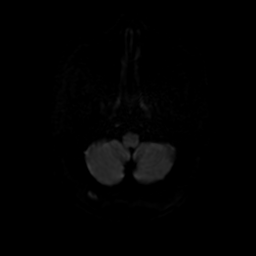
[im 34/100]
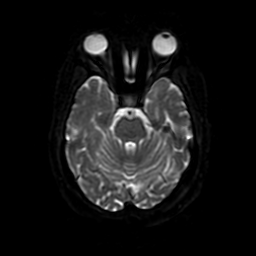
[im 50/100]
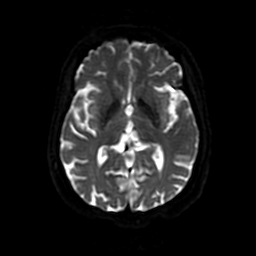
[im 67/100]
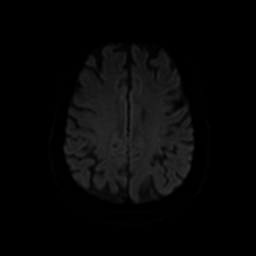
[im 83/100]
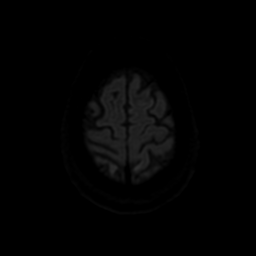
[im 100/100]
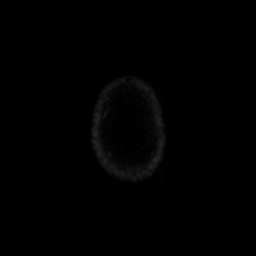

[Series 3: DWI · coronal · 4.0mm · 0.94mm/px · 6 of 74 slices shown (2 of 2)]
[im 1/74]
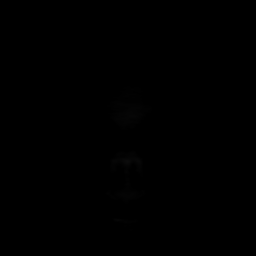
[im 15/74]
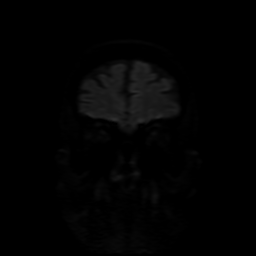
[im 30/74]
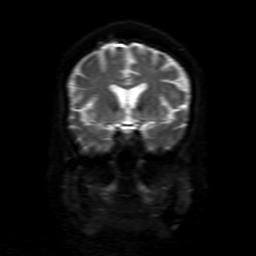
[im 44/74]
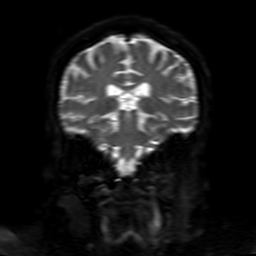
[im 59/74]
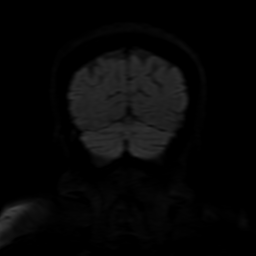
[im 74/74]
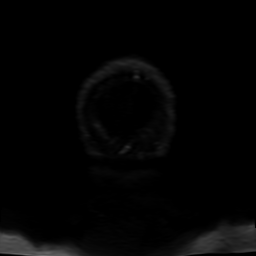

[Series 4: FLAIR · sagittal · 5.0mm · 0.23mm/px · 2 of 23 slices shown (1 of 2)]
[im 1/23]
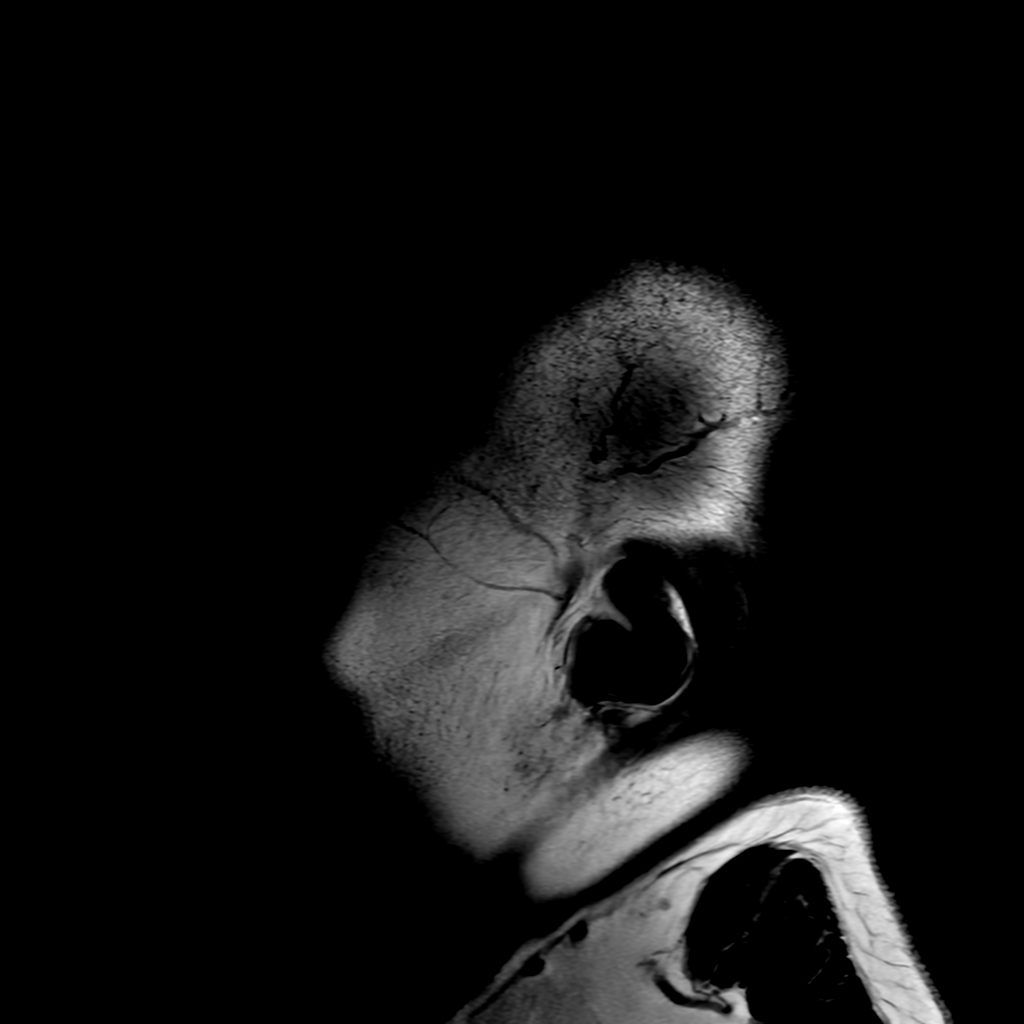
[im 23/23]
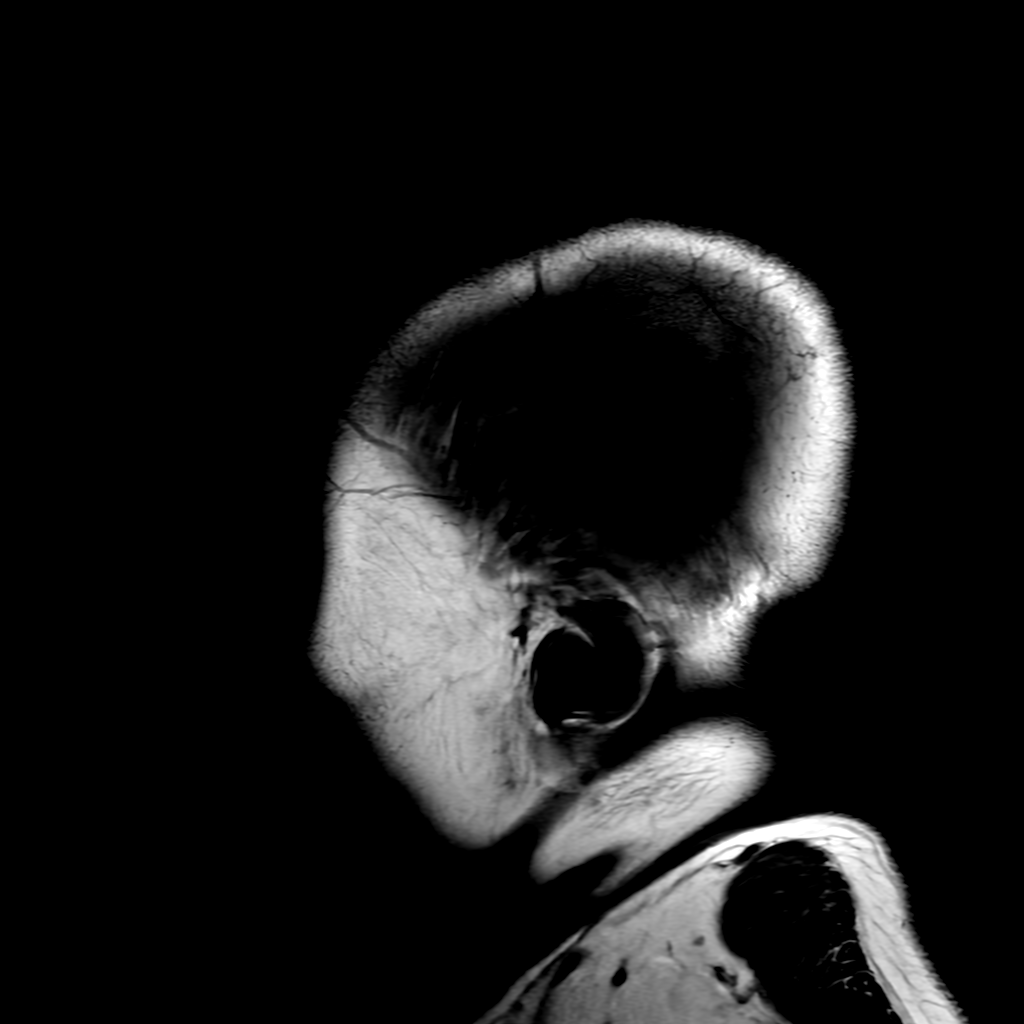

[Series 6: FLAIR · axial · 3.0mm · 0.41mm/px · z∈[-84,+47]mm · 2 of 23 slices shown (2 of 2)]
[im 1/23]
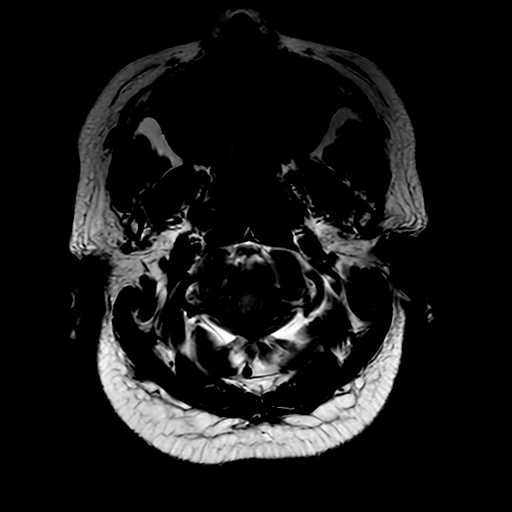
[im 23/23]
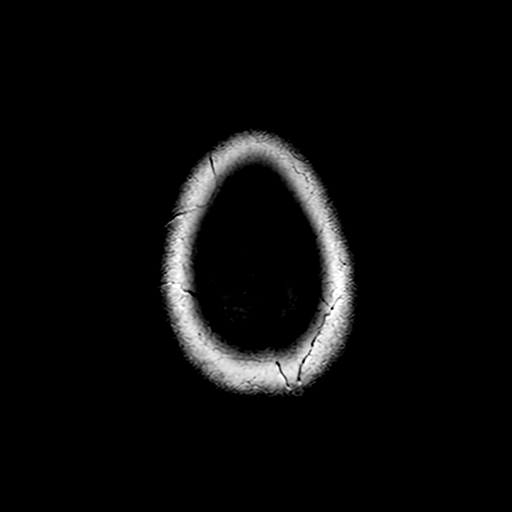

[Series 250: ADC · axial · 3.0mm · 0.94mm/px · z∈[-99,+46]mm · 4 of 50 slices shown (1 of 2)]
[im 1/50]
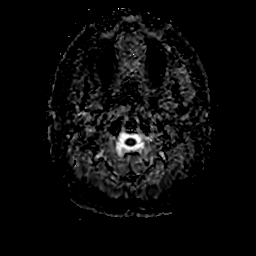
[im 17/50]
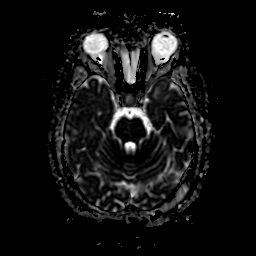
[im 33/50]
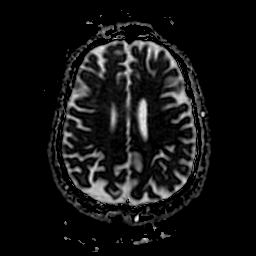
[im 50/50]
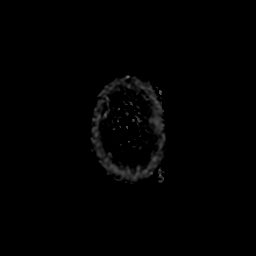

[Series 350: ADC · coronal · 4.0mm · 0.94mm/px · 3 of 37 slices shown (2 of 2)]
[im 1/37]
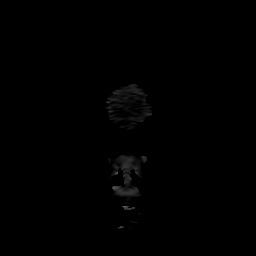
[im 19/37]
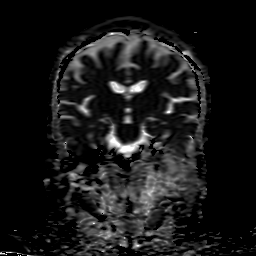
[im 37/37]
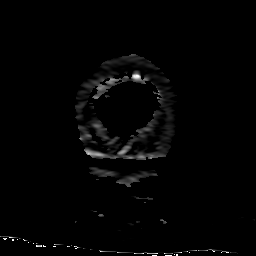

[24 of 48 positions shown; findings below may reference images not displayed]

FINDINGS: Brain:

Stable, mild generalized parenchymal atrophy.

There is a subtle punctate focus of diffusion weighted
hyperintensity within the left cerebellum which is too small to
accurately characterize on the ADC map (series 2, image 14). This
finding is not appreciated on the coronal diffusion-weighted
sequence and may reflect artifact. A tiny acute infarct is difficult
to definitively exclude. No evidence of acute infarct elsewhere
within the brain.

There is mild-to-moderate scattered T2/FLAIR hyperintensity within
the cerebral white matter which is nonspecific, but consistent with
chronic small vessel ischemic disease. Findings are stable as
compared to prior MRI [DATE].

There is a 4 mm rounded focus of T1 hyperintensity within the
central pituitary gland. In retrospect, this finding was present on
prior examination, although seen to better advantage on today's
study.

No chronic intracranial blood products.

No extra-axial fluid collection.

No midline shift.

Vascular: Expected proximal arterial flow voids. Left cerebellar
developmental venous anomaly.

Skull and upper cervical spine: No focal marrow lesion. Incompletely
assessed cervical spondylosis.

Sinuses/Orbits: Visualized orbits show no acute finding. No
significant paranasal sinus disease or mastoid effusion at the
imaged levels.
IMPRESSION: Subtle punctate focus of restricted diffusion questioned within the
left cerebellum, only appreciated on the axial diffusion-weighted
sequence. Given the appearance, this is favored to reflect artifact.
A tiny acute infarct cannot be excluded.

Mild-to-moderate chronic small vessel ischemic changes within the
cerebral white matter, stable.

4 mm rounded focus of T1 hyperintensity within the central pituitary
gland. Differential considerations include Rathke's cleft cyst
versus hemorrhagic/necrotic pituitary microadenoma. Correlate with
relevant laboratory values. Additionally, this finding could be
better characterized with non-emergent contrast enhanced pituitary
protocol brain MRI.

Stable, mild generalized parenchymal atrophy.

## 2019-06-23 IMAGING — MR MR HEAD WO/W CM
8 of 16 series · 22 of 48 positions shown · IV contrast (gadavist)
Comparison: Brain MRI [9Q] hours today.

CLINICAL DATA: 64-year-old female with 4 mm T1 hyperintense area in
the central pituitary gland on routine brain MRI earlier today.
Dizziness and nausea.

EXAM:
MRI HEAD WITHOUT AND WITH CONTRAST
TECHNIQUE: Multiplanar, multiecho pulse sequences of the brain and surrounding
structures were obtained without and with intravenous contrast.
CONTRAST:  7.2mL GADAVIST GADOBUTROL 1 MMOL/ML IV SOLN

[Series 5: T1 · sagittal · 5.0mm · 0.72mm/px · 4 of 21 slices shown (1 of 3)]
[im 1/21]
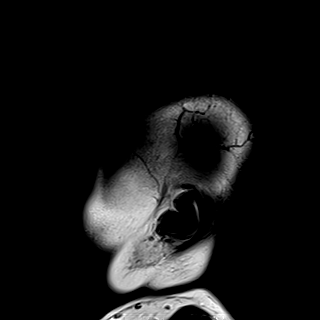
[im 7/21]
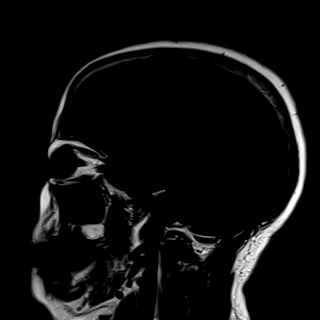
[im 14/21]
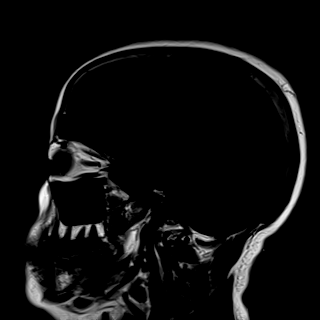
[im 21/21]
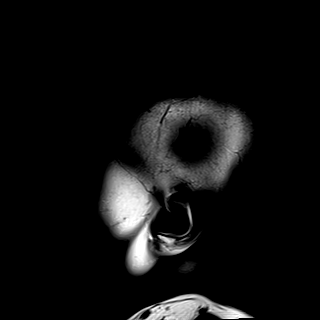

[Series 7: T1 · sagittal · 3.0mm · 0.25mm/px · 2 of 14 slices shown (2 of 3)]
[im 1/14]
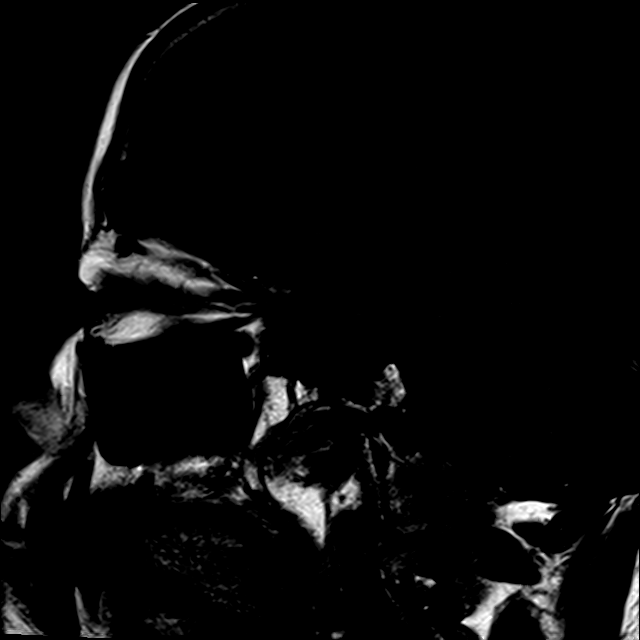
[im 14/14]
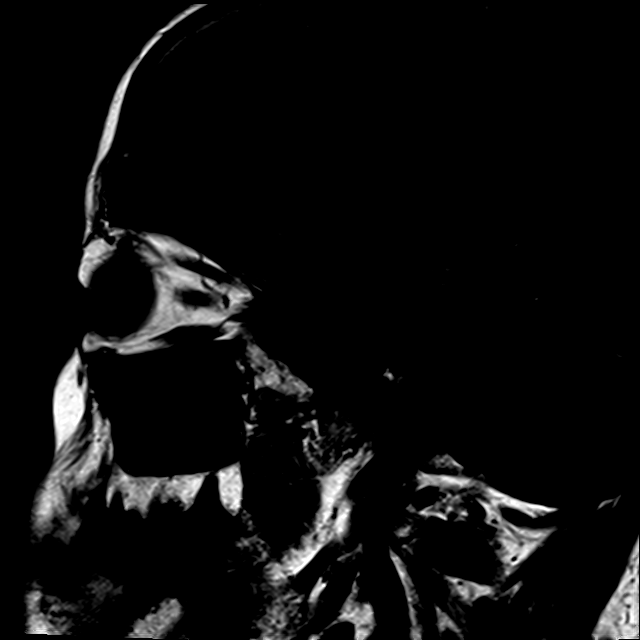

[Series 8: T1 · coronal · 3.0mm · 0.31mm/px · 2 of 15 slices shown (3 of 3)]
[im 1/15]
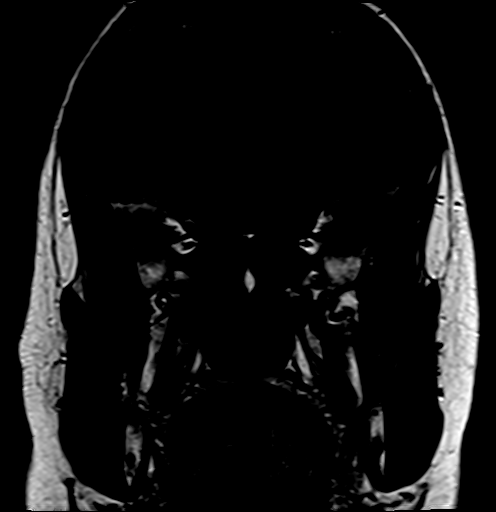
[im 15/15]
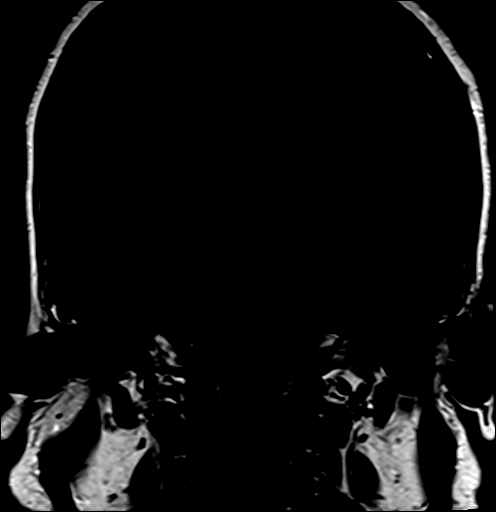

[Series 9: t1_tse_cor_dynamic pre · coronal · non-contrast · 3.0mm · 0.49mm/px · 2 of 15 slices shown]
[im 1/15]
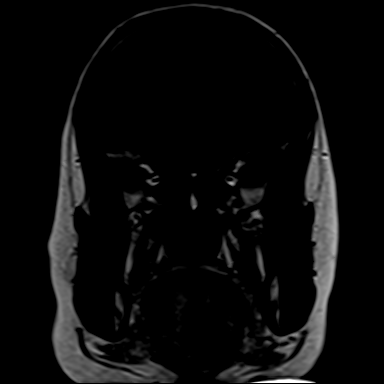
[im 15/15]
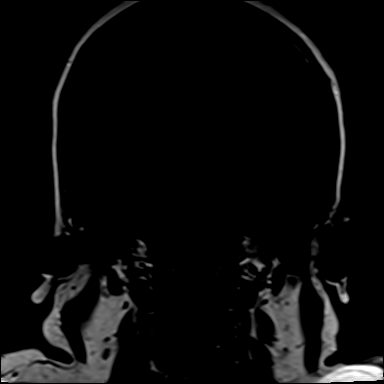

[Series 16: T2 post-contrast · coronal · 5.0mm · 0.90mm/px · 4 of 28 slices shown]
[im 1/28]
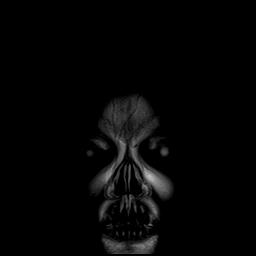
[im 10/28]
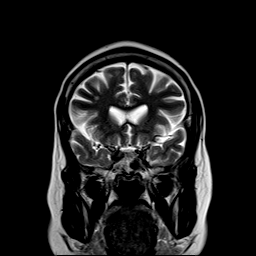
[im 19/28]
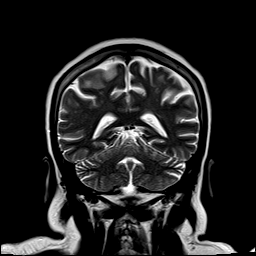
[im 28/28]
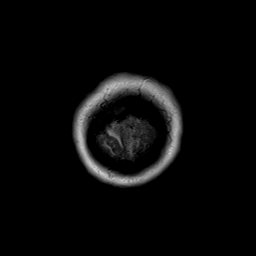

[Series 17: T1 post-contrast · sagittal · 3.0mm · 0.25mm/px · 2 of 14 slices shown (1 of 3)]
[im 1/14]
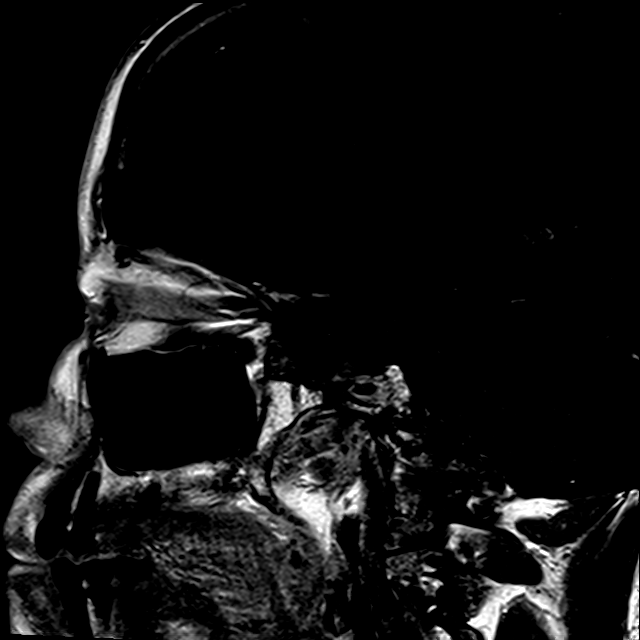
[im 14/14]
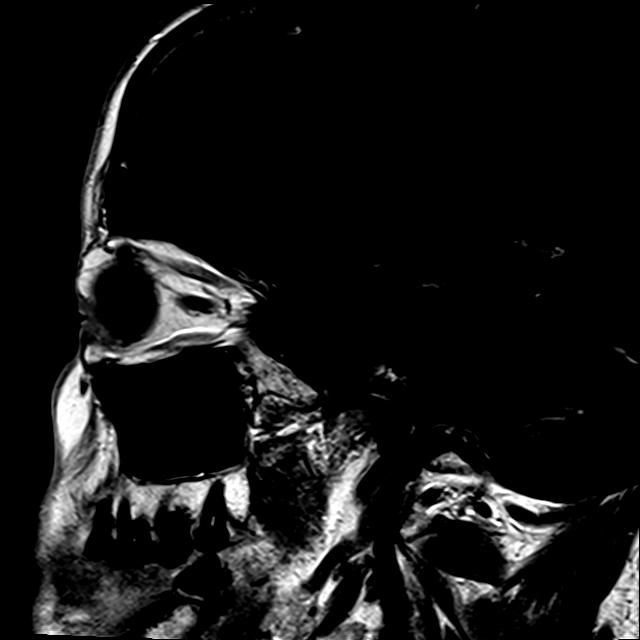

[Series 18: T1 post-contrast · coronal · 3.0mm · 0.31mm/px · 2 of 15 slices shown (2 of 3)]
[im 1/15]
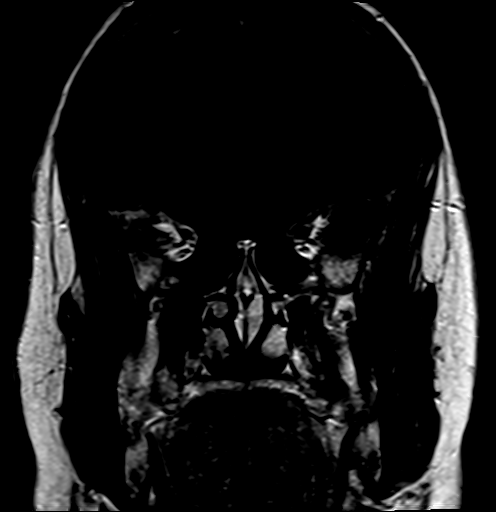
[im 15/15]
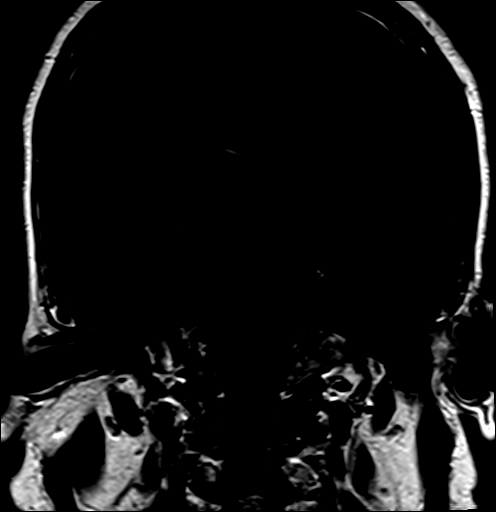

[Series 20: T1 post-contrast · coronal · 5.0mm · 0.34mm/px · 4 of 28 slices shown (3 of 3)]
[im 1/28]
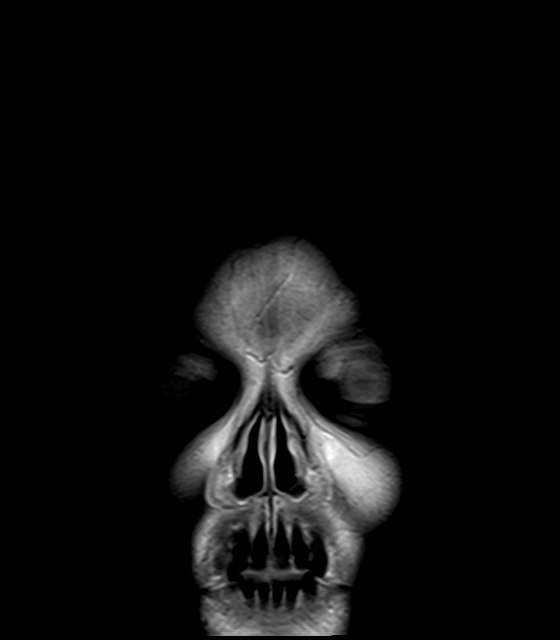
[im 10/28]
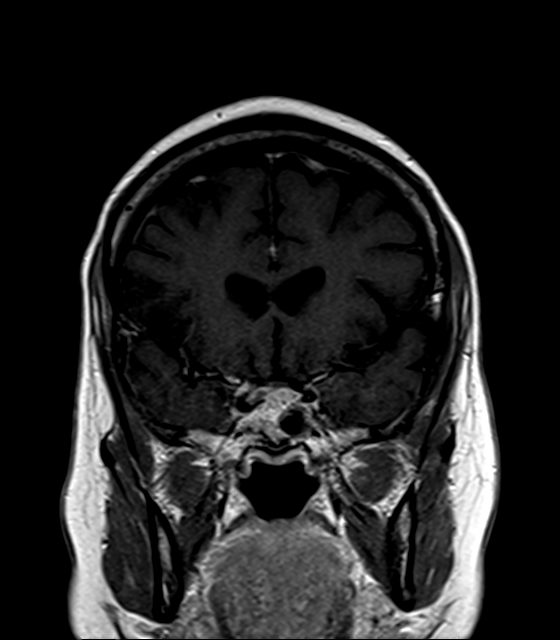
[im 19/28]
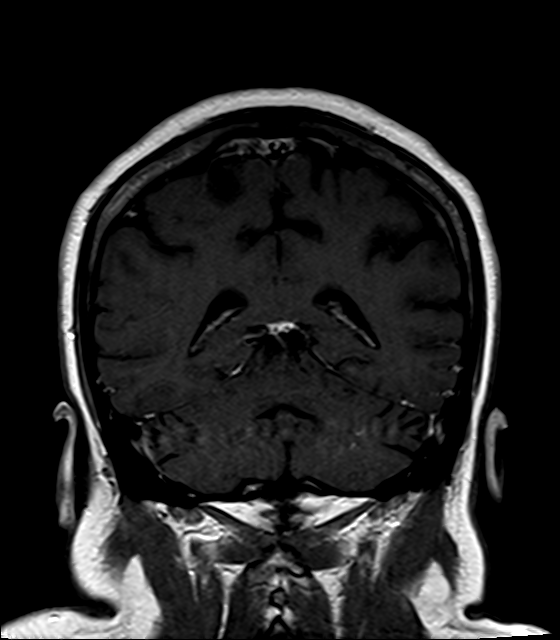
[im 28/28]
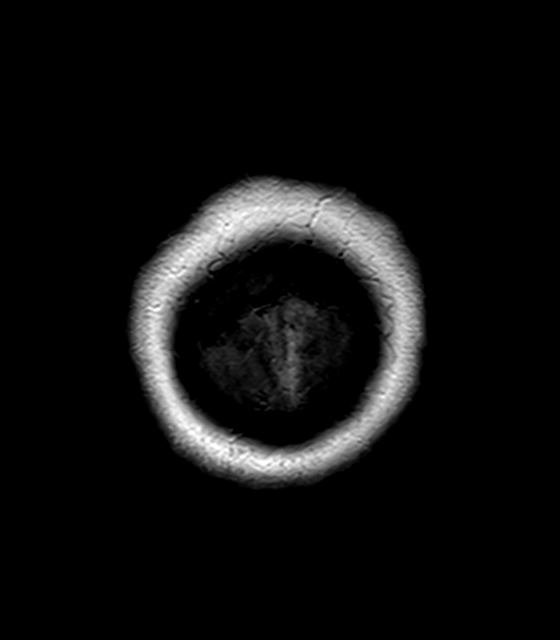

[22 of 48 positions shown; findings below may reference images not displayed]

FINDINGS: Diffusion-weighted imaging was not repeated. Whole brain pre and
postcontrast images were obtained. No intracranial mass effect or
ventriculomegaly.

In the area of heterogeneous diffusion in the left cerebellum
described earlier there is a small developmental venous anomaly
(normal variant series 19, image 11), which likely resulted in mild
susceptibility artifact explaining the diffusion heterogeneity.

No abnormal gray or white matter enhancement. No dural thickening.
The major dural venous sinuses are enhancing and appear to be
patent.

Dedicated pituitary imaging. Overall normal pituitary size and
configuration. The 3-4 mm oval intrinsic T1 and T2 hyperintense area
is re-demonstrated in the central aspect of the gland on series 8,
image 9. Following contrast, the gland enhances homogeneously.
Normal infundibulum and suprasellar cistern. Normal hypothalamus.
Normal cavernous sinus.

Visualized bone marrow signal is within normal limits. Negative
visible cervical spine.
IMPRESSION: 1. Tiny intrinsic T1 hyperintense area within the central pituitary
which is otherwise normal. This is most compatible with a benign
either RUDI cleft or pars intermedia cyst.
No further imaging evaluation is necessary. This follows ACR
consensus guidelines: Management of Incidental Pituitary Findings on
CT, MRI and F18-FDG PET: A White Paper of the ACR Incidental
Findings Committee. [HOSPITAL] [9Q]; 15: 966-72.

2. Small developmental venous anomaly in the left cerebellum (normal
variant) which explains the mild diffusion heterogeneity there
earlier today.
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
## 2019-06-23 MED ORDER — GLIMEPIRIDE 2 MG PO TABS
2.0000 mg | ORAL_TABLET | Freq: Two times a day (BID) | ORAL | Status: DC
  Administered 2019-06-24: 2 mg via ORAL
  Filled 2019-06-23 (×3): qty 1

## 2019-06-23 MED ORDER — CLOPIDOGREL BISULFATE 300 MG PO TABS
300.0000 mg | ORAL_TABLET | Freq: Once | ORAL | Status: AC
  Administered 2019-06-23: 300 mg via ORAL
  Filled 2019-06-23: qty 1

## 2019-06-23 MED ORDER — GADOBUTROL 1 MMOL/ML IV SOLN
7.2000 mL | Freq: Once | INTRAVENOUS | Status: AC | PRN
  Administered 2019-06-23: 7.2 mL via INTRAVENOUS

## 2019-06-23 MED ORDER — ASPIRIN EC 81 MG PO TBEC
81.0000 mg | DELAYED_RELEASE_TABLET | Freq: Every day | ORAL | Status: DC
  Administered 2019-06-23 – 2019-06-24 (×2): 81 mg via ORAL
  Filled 2019-06-23 (×2): qty 1

## 2019-06-23 MED ORDER — DIAZEPAM 5 MG PO TABS: 5.0000 mg | ORAL_TABLET | Freq: Once | ORAL | Status: DC

## 2019-06-23 MED ORDER — VITAMIN D3 1.25 MG (50000 UT) PO CAPS: 50000.0000 [IU] | ORAL_CAPSULE | ORAL | Status: DC

## 2019-06-23 MED ORDER — VITAMIN D (ERGOCALCIFEROL) 1.25 MG (50000 UNIT) PO CAPS: 50000.0000 [IU] | ORAL_CAPSULE | ORAL | Status: DC

## 2019-06-23 MED ORDER — ACETAMINOPHEN 325 MG PO TABS
650.0000 mg | ORAL_TABLET | ORAL | Status: DC | PRN
  Administered 2019-06-24: 650 mg via ORAL
  Filled 2019-06-23: qty 2

## 2019-06-23 MED ORDER — ATORVASTATIN CALCIUM 40 MG PO TABS
40.0000 mg | ORAL_TABLET | Freq: Every day | ORAL | Status: DC
  Administered 2019-06-23 – 2019-06-24 (×2): 40 mg via ORAL
  Filled 2019-06-23 (×2): qty 1

## 2019-06-23 MED ORDER — INSULIN ASPART 100 UNIT/ML ~~LOC~~ SOLN: 0.0000 [IU] | Freq: Three times a day (TID) | SUBCUTANEOUS | Status: DC

## 2019-06-23 MED ORDER — LEVOTHYROXINE SODIUM 88 MCG PO TABS
88.0000 ug | ORAL_TABLET | ORAL | Status: DC
  Administered 2019-06-24: 88 ug via ORAL
  Filled 2019-06-23: qty 1

## 2019-06-23 MED ORDER — NIACIN ER (ANTIHYPERLIPIDEMIC) 500 MG PO TBCR
500.0000 mg | EXTENDED_RELEASE_TABLET | Freq: Every day | ORAL | Status: DC
  Administered 2019-06-23: 500 mg via ORAL
  Filled 2019-06-23 (×2): qty 1

## 2019-06-23 MED ORDER — PANTOPRAZOLE SODIUM 40 MG PO TBEC
40.0000 mg | DELAYED_RELEASE_TABLET | Freq: Two times a day (BID) | ORAL | Status: DC
  Administered 2019-06-24: 40 mg via ORAL
  Filled 2019-06-23: qty 1

## 2019-06-23 MED ORDER — MECLIZINE HCL 25 MG PO TABS
25.0000 mg | ORAL_TABLET | Freq: Once | ORAL | Status: AC
  Administered 2019-06-23: 25 mg via ORAL
  Filled 2019-06-23: qty 1

## 2019-06-23 MED ORDER — STROKE: EARLY STAGES OF RECOVERY BOOK
Freq: Once | Status: DC
  Filled 2019-06-23: qty 1

## 2019-06-23 MED ORDER — ACETAMINOPHEN 160 MG/5ML PO SOLN: 650.0000 mg | ORAL | Status: DC | PRN

## 2019-06-23 MED ORDER — IOHEXOL 350 MG/ML SOLN
100.0000 mL | Freq: Once | INTRAVENOUS | Status: AC | PRN
  Administered 2019-06-23: 100 mL via INTRAVENOUS

## 2019-06-23 MED ORDER — LEVOTHYROXINE SODIUM 88 MCG PO TABS: 44.0000 ug | ORAL_TABLET | ORAL | Status: DC

## 2019-06-23 MED ORDER — ONDANSETRON HCL 4 MG/2ML IJ SOLN
4.0000 mg | Freq: Once | INTRAMUSCULAR | Status: AC
  Administered 2019-06-23: 4 mg via INTRAVENOUS
  Filled 2019-06-23: qty 2

## 2019-06-23 MED ORDER — CLOPIDOGREL BISULFATE 75 MG PO TABS
75.0000 mg | ORAL_TABLET | Freq: Every day | ORAL | Status: DC
  Administered 2019-06-24: 75 mg via ORAL
  Filled 2019-06-23: qty 1

## 2019-06-23 MED ORDER — ENOXAPARIN SODIUM 40 MG/0.4ML ~~LOC~~ SOLN: 40.0000 mg | SUBCUTANEOUS | Status: DC

## 2019-06-23 MED ORDER — ENOXAPARIN SODIUM 40 MG/0.4ML ~~LOC~~ SOLN
40.0000 mg | SUBCUTANEOUS | Status: DC
  Administered 2019-06-23: 40 mg via SUBCUTANEOUS
  Filled 2019-06-23: qty 0.4

## 2019-06-23 MED ORDER — SODIUM CHLORIDE 0.9 % IV BOLUS
1000.0000 mL | Freq: Once | INTRAVENOUS | Status: AC
  Administered 2019-06-23: 1000 mL via INTRAVENOUS

## 2019-06-23 MED ORDER — ACETAMINOPHEN 650 MG RE SUPP: 650.0000 mg | RECTAL | Status: DC | PRN

## 2019-06-23 MED ORDER — LABETALOL HCL 5 MG/ML IV SOLN: 5.0000 mg | INTRAVENOUS | Status: DC | PRN
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
## 2019-06-23 NOTE — ED Notes (Signed)
Pt agreeable to MRI at this time, pt transferred to MRI.

## 2019-06-23 NOTE — ED Notes (Signed)
Pt refused to sign the request to trasfere because she states the dr is sending her she di NOT request the the transfere for the MRI
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
## 2019-06-23 NOTE — Consult Note (Addendum)
Neurology Consultation Reason for Consult: Vertigo Referring Physician: Maryan Rued, W  CC: Vertigo  History is obtained from: Patient  HPI: Terri Waters is a 64 y.o. female who was in her normal state of health while she was sitting on the toilet and then had acute onset vertigo.  This was not precipitated by any type of movement, just happened spontaneously while she was on the toilet.  This has been persistent, though exacerbated with any type of movements since this started at 2 AM.  She denies any numbness, weakness, visual change, or other symptoms.  She denies any history of tremor or clumsiness in the left hand, though she has had some today.  LKW: 2 AM tpa given?: no, outside of window    ROS: A 14 point ROS was performed and is negative except as noted in the HPI.   Past Medical History:  Diagnosis Date  . Cancer (HCC)    papillary carcinoma  . Diabetes mellitus without complication ([place])   . High cholesterol   . Hypertension   . Hypothyroidism   . Migraines   . Thyroid disease   . Ventricular tachycardia (HCC)      Family History  Problem Relation Age of Onset  . Heart disease Mother   . Chronic Renal Failure Mother   . Diabetes Mother   . Stroke Father   . Hypertension Father   . High Cholesterol Father   . Breast cancer Maternal Grandmother   . Colon cancer Maternal Grandmother   . Colon cancer Maternal Grandfather      Social History:  reports that she has never smoked. She has never used smokeless tobacco. She reports that she does not drink alcohol and does not use drugs.   Exam: Current vital signs: BP 120/78   Pulse 71   Temp 98.2 F (36.8 C) (Oral)   Resp 16   Ht 5\' 3"  (1.6 m)   Wt 108 kg   SpO2 96%   BMI 42.16 kg/m  Vital signs in last 24 hours: Temp:  [98.2 F (36.8 C)-98.6 F (37 C)] 98.2 F (36.8 C) (06/10 1106) Pulse Rate:  [65-71] 71 (06/10 1106) Resp:  [16-17] 16 (06/10 1106) BP: (108-142)/(60-81) 120/78 (06/10 1106) SpO2:   [96 %-100 %] 96 % (06/10 1106) Weight:  [166 kg] 108 kg (06/10 1106)   Physical Exam  Constitutional: Appears well-developed and well-nourished.  Psych: Affect appropriate to situation Eyes: No scleral injection HENT: No OP obstrucion MSK: no joint deformities.  Cardiovascular: Normal rate and regular rhythm.  Respiratory: Effort normal, non-labored breathing GI: Soft.  No distension. There is no tenderness.  Skin: WDI  Neuro: Mental Status: Patient is awake, alert, oriented to person, place, month, year, and situation. Patient is able to give a clear and coherent history. No signs of aphasia or neglect Cranial Nerves: II: Visual Fields are full. Pupils are equal, round, and reactive to light.   III,IV, VI: EOMI without ptosis or diploplia.  No nystagmus noted. V: Facial sensation is symmetric to temperature VII: Facial movement is symmetric.  VIII: hearing is intact to voice X: Uvula elevates symmetrically XI: Shoulder shrug is symmetric. XII: tongue is midline without atrophy or fasciculations.  Motor: Tone is normal. Bulk is normal. 5/5 strength was present in all four extremities.  Sensory: Sensation is symmetric to light touch and temperature in the arms and legs. Cerebellar: FNF and HKS are intact on the right, she has mild tremor with only minimal past-pointing on finger-nose-finger  on the left, mildly ataxic heel-knee-shin on the left.  Dix-Hallpike was negative, no evidence of nystagmus, though she does become mildly vertiginous in both directions while laying her back.    I have reviewed labs in epic and the results pertinent to this consultation are: CMP is unremarkable  I have reviewed the images obtained: MRI brain-punctate focus of restricted diffusion in the left cerebellum.  Impression: 64 year old female with acute onset vertigo and left hand clumsiness.  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  Recommendations: - HgbA1c, fasting lipid panel - Frequent neuro checks - Echocardiogram - CT angio head neck - Prophylactic therapy-Antiplatelet med: Aspirin -81 mg and Plavix 75 mg daily following 300 mg load - Risk factor modification - Telemetry monitoring - PT consult, OT consult, Speech consult - Stroke team to follow - Consider endocrine consult, contrasted MRI to evaluate pituitary lesion  Roland Rack, MD [facility] [phone]  If 7pm- 7am, please page neurology on call as listed in [place].

## 2019-06-23 NOTE — ED Notes (Signed)
Report called to [facility] , charge nurse pt aware that she may be hallway, still has IV  Left AC, pt refused valium
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
## 2019-06-23 NOTE — Progress Notes (Signed)
Pt arrived to 3W23. A&Ox4, complains of no pain, only intermittent dizziness.

## 2019-06-24 ENCOUNTER — Observation Stay (HOSPITAL_BASED_OUTPATIENT_CLINIC_OR_DEPARTMENT_OTHER): Payer: 59

## 2019-06-24 ENCOUNTER — Encounter (HOSPITAL_COMMUNITY): Payer: Self-pay | Admitting: Family Medicine

## 2019-06-24 DIAGNOSIS — I639 Cerebral infarction, unspecified: Secondary | ICD-10-CM | POA: Diagnosis not present

## 2019-06-24 DIAGNOSIS — I6389 Other cerebral infarction: Secondary | ICD-10-CM

## 2019-06-24 LAB — CBC
HCT: 38 % (ref 36.0–46.0)
Hemoglobin: 12.1 g/dL (ref 12.0–15.0)
MCH: 27.8 pg (ref 26.0–34.0)
MCHC: 31.8 g/dL (ref 30.0–36.0)
MCV: 87.4 fL (ref 80.0–100.0)
Platelets: 346 10*3/uL (ref 150–400)
RBC: 4.35 MIL/uL (ref 3.87–5.11)
RDW: 15.7 % — ABNORMAL HIGH (ref 11.5–15.5)
WBC: 5.7 10*3/uL (ref 4.0–10.5)
nRBC: 0 % (ref 0.0–0.2)

## 2019-06-24 LAB — ECHOCARDIOGRAM COMPLETE
Height: 63 in
Weight: 3808 oz

## 2019-06-24 LAB — BASIC METABOLIC PANEL
Anion gap: 9 (ref 5–15)
BUN: 10 mg/dL (ref 8–23)
CO2: 25 mmol/L (ref 22–32)
Calcium: 8.8 mg/dL — ABNORMAL LOW (ref 8.9–10.3)
Chloride: 103 mmol/L (ref 98–111)
Creatinine, Ser: 0.95 mg/dL (ref 0.44–1.00)
GFR calc Af Amer: >60 mL/min (ref >60)
GFR calc non Af Amer: >60 mL/min (ref >60)
Glucose, Bld: 143 mg/dL — ABNORMAL HIGH (ref 70–99)
Potassium: 3.7 mmol/L (ref 3.5–5.1)
Sodium: 137 mmol/L (ref 135–145)

## 2019-06-24 LAB — LIPID PANEL
Cholesterol: 128 mg/dL (ref 0–200)
HDL: 52 mg/dL (ref >40)
LDL Cholesterol: 59 mg/dL (ref 0–99)
Total CHOL/HDL Ratio: 2.5 RATIO
Triglycerides: 83 mg/dL (ref <150)
VLDL: 17 mg/dL (ref 0–40)

## 2019-06-24 LAB — GLUCOSE, CAPILLARY
Glucose-Capillary: 110 mg/dL — ABNORMAL HIGH (ref 70–99)
Glucose-Capillary: 148 mg/dL — ABNORMAL HIGH (ref 70–99)
Glucose-Capillary: 107 mg/dL — ABNORMAL HIGH (ref 70–99)
Glucose-Capillary: 130 mg/dL — ABNORMAL HIGH (ref 70–99)

## 2019-06-24 LAB — HEMOGLOBIN A1C
Hgb A1c MFr Bld: 7.6 % — ABNORMAL HIGH (ref 4.8–5.6)
Mean Plasma Glucose: 171.42 mg/dL

## 2019-06-24 LAB — TSH: TSH: 3.62 u[IU]/mL (ref 0.350–4.500)

## 2019-06-24 MED ORDER — ASPIRIN 81 MG PO TABS: 81.0000 mg | ORAL_TABLET | Freq: Every day | ORAL | 0 refills | Status: AC

## 2019-06-24 MED ORDER — STROKE: EARLY STAGES OF RECOVERY BOOK: 1.0000 | Freq: Once | 0 refills | Status: AC

## 2019-06-24 MED ORDER — CLOPIDOGREL BISULFATE 75 MG PO TABS: 75.0000 mg | ORAL_TABLET | Freq: Every day | ORAL | 0 refills | Status: DC

## 2019-06-24 NOTE — Hospital Course (Addendum)
Terri Waters is a 64 y.o. female presenting with dizziness with nausea. PMH is significant for hyperlipidemia, type II diabetes with peripheral neuropathy, hypothyroidism, hypertension.  Below is her hospital course by problem.   Dizziness with nausea patient presented with several hour history of dizziness with nausea.  She reported to [facility] where she got a CT angio head and neck which showed no acute findings and then was transferred to [facility] for a head MRI.  MR brain showed subtle punctate focus of restricted diffusion within the left cerebellum only appreciated on the axial diffusion weighted sequence.  May be a tiny acute infarct.  A 4 mm rounded focus of T1 hyperintensity within the central pituitary gland was also noted.  Physical exam was significant for an intentional tremor in upper extremities bilateral but worse on the left hand.  She also had difficulty with finger-to-nose testing.  Patient was noticeably nauseated and reported worsening dizziness with ambulation.  Patient was admitted for CVA and neurology provided recommendations.  Repeat MRI was done to evaluate pituitary mass which showed hyperintense area within the central pituitary which is otherwise normal.  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  No further imaging or evaluation necessary.  Lab work was significant for a hemoglobin A1c of 7.6.  Lipid panel showed cholesterol of 128, HDL 52, LDL 59, VLDL 17, triglycerides 83.  TSH was 3.620.  Echocardiogram showed LVEF of 55 to 60% and showed no intracardiac source of embolism.  Patient was evaluated physical therapy and Occupational Therapy who recommended no outpatient PT/OT.  Neurology recommended that patient be discharged on aspirin and Plavix for 3 weeks and then Plavix alone. She is to follow up with [facility] in 4-6 weeks.   History of papillary carcinoma, total thyroidectomy On  admission patient reported that she has been taking her.  TSH was checked and was found to be within normal limits.   Type 2 diabetes Patient's blood sugar on arrival was 145.  Hemoglobin A1c checked during work-up was 7.6.  Home medications include Trulicity, glimepiride, Metformin, empagliflozin.  She will continue on his medications and follow-up with her PCP.

## 2019-06-24 NOTE — Progress Notes (Signed)
Pt given all d/c instructions pt and pts husband verbalized understanding to all d/c instructions.  Tele d/c ccmd notified, IV site d/c.  Pt is no distress at this time.  Pt transported via W/C to main entrance by NT.

## 2019-06-24 NOTE — Plan of Care (Signed)
  Problem: Education: Goal: Knowledge of General Education information will improve Description: Including pain rating scale, medication(s)/side effects and non-pharmacologic comfort measures Outcome: Progressing   Problem: Health Behavior/Discharge Planning: Goal: Ability to manage health-related needs will improve Outcome: Progressing   Problem: Clinical Measurements: Goal: Ability to maintain clinical measurements within normal limits will improve Outcome: Progressing Goal: Will remain free from infection Outcome: Progressing Goal: Diagnostic test results will improve Outcome: Progressing Goal: Respiratory complications will improve Outcome: Progressing Goal: Cardiovascular complication will be avoided Outcome: Progressing   Problem: Activity: Goal: Risk for activity intolerance will decrease Outcome: Progressing   Problem: Nutrition: Goal: Adequate nutrition will be maintained Outcome: Progressing   Problem: Coping: Goal: Level of anxiety will decrease Outcome: Progressing   Problem: Elimination: Goal: Will not experience complications related to bowel motility Outcome: Progressing Goal: Will not experience complications related to urinary retention Outcome: Progressing   Problem: Pain Managment: Goal: General experience of comfort will improve Outcome: Progressing   Problem: Safety: Goal: Ability to remain free from injury will improve Outcome: Progressing   Problem: Skin Integrity: Goal: Risk for impaired skin integrity will decrease Outcome: Progressing   Problem: Education: Goal: Knowledge of disease or condition will improve Outcome: Progressing Goal: Knowledge of secondary prevention will improve Outcome: Progressing Goal: Knowledge of patient specific risk factors addressed and post discharge goals established will improve Outcome: Progressing Goal: Individualized Educational Video(s) Outcome: Progressing   Problem: Coping: Goal: Will verbalize  positive feelings about self Outcome: Progressing Goal: Will identify appropriate support needs Outcome: Progressing   Problem: Health Behavior/Discharge Planning: Goal: Ability to manage health-related needs will improve Outcome: Progressing   Problem: Self-Care: Goal: Ability to participate in self-care as condition permits will improve Outcome: Progressing Goal: Verbalization of feelings and concerns over difficulty with self-care will improve Outcome: Progressing Goal: Ability to communicate needs accurately will improve Outcome: Progressing   Problem: Ischemic Stroke/TIA Tissue Perfusion: Goal: Complications of ischemic stroke/TIA will be minimized Outcome: Progressing

## 2019-06-24 NOTE — Evaluation (Signed)
Occupational Therapy Evaluation Patient Details Name: Terri Waters MRN: [id number] DOB: 1955/03/21 Today's Date: 06/24/2019    History of Present Illness Pt is a 64 y/o female with PMH significant for DM2 with peripheral neuropathy, HTN, ventricular tachycardia, R TKA, presenting with diziness and nausea. MRI brain reveals-punctate focus of restricted diffusion in the left cerebellum. L LE negative for DVT. Incidental finding of 56mm pituitary mass.    Clinical Impression   PTA patient independent and working. Admitted for above and limited by L UE dysmetria, blurry vision, and dizziness.  Pt reports no dizziness during session, reports it comes and goes with 2 episodes this morning. Patient exiting bathroom upon entry, modified independent for transfers, in room mobility and supervision to modified independent for ADLs.  Patient educated on L hand exercises, provided yellow theraputty and HEP for putty/coordination. Will follow acutely to optimize L hand functional use for ADLs and return to work, but anticipate no further OT needs after dc home.    Follow Up Recommendations  No OT follow up    Equipment Recommendations  None recommended by OT    Recommendations for Other Services       Precautions / Restrictions Precautions Precautions: Fall Restrictions Weight Bearing Restrictions: No      Mobility Bed Mobility               General bed mobility comments: OOB upon entry  Transfers Overall transfer level: Modified independent                    Balance Overall balance assessment: No apparent balance deficits (not formally assessed)                                         ADL either performed or assessed with clinical judgement   ADL Overall ADL's : Needs assistance/impaired     Grooming: Modified independent;Standing   Upper Body Bathing: Supervision/ safety;Standing   Lower Body Bathing: Supervison/ safety;Sit to/from stand    Upper Body Dressing : Sitting;Supervision/safety   Lower Body Dressing: Supervision/safety;Sit to/from stand   Toilet Transfer: Modified Independent;Ambulation Toilet Transfer Details (indicate cue type and reason): pt exiting bathroom upon entry  [place] and Hygiene: Modified independent;Sit to/from stand       Functional mobility during ADLs: Modified independent General ADL Comments: pt limited by decreased coordination L UE      Vision Baseline Vision/History: Wears glasses Wears Glasses:  (on computer) Patient Visual Report: Blurring of vision Vision Assessment?: Yes Eye Alignment: Within Functional Limits Ocular Range of Motion: Within Functional Limits Alignment/Gaze Preference: Within Defined Limits Tracking/Visual Pursuits: Able to track stimulus in all quads without difficulty Saccades: Within functional limits Convergence: Within functional limits (reports headache after testing ) Visual Fields: No apparent deficits     Perception     Praxis      Pertinent Vitals/Pain Pain Assessment: Faces Faces Pain Scale: Hurts a little bit Pain Location: headache after visual testing Pain Descriptors / Indicators: Headache Pain Intervention(s): Limited activity within patient's tolerance;Monitored during session     Hand Dominance Right   Extremity/Trunk Assessment Upper Extremity Assessment Upper Extremity Assessment: LUE deficits/detail LUE Deficits / Details: Strength WFL, slight dysmetric and decreased FMC  LUE Sensation: WNL LUE Coordination: decreased fine motor;decreased gross motor   Lower Extremity Assessment Lower Extremity Assessment: Defer to PT evaluation   Cervical / Trunk  Assessment Cervical / Trunk Assessment: Normal   Communication Communication Communication: No difficulties   Cognition Arousal/Alertness: Awake/alert Behavior During Therapy: WFL for tasks assessed/performed Overall Cognitive Status: Within  Functional Limits for tasks assessed                                     General Comments  spouse present and supportive    Exercises     Shoulder Instructions      Home Living Family/patient expects to be discharged to:: Private residence Living Arrangements: Spouse/significant other Available Help at Discharge: Family Type of Home: House Home Access: Stairs to enter Technical brewer of Steps: 4 Entrance Stairs-Rails: Right Home Layout: Two level;Bed/bath upstairs;Able to live on main level with bedroom/bathroom Alternate Level Stairs-Number of Steps: flight Alternate Level Stairs-Rails: Right Bathroom Shower/Tub: Tub/shower unit;Walk-in shower   Bathroom Toilet: Standard     Home Equipment: Walker - 2 wheels          Prior Functioning/Environment Level of Independence: Independent        Comments: driving, working        OT Problem List: Decreased activity tolerance;Impaired vision/perception;Decreased coordination;Impaired UE functional use      OT Treatment/Interventions: Self-care/ADL training;Neuromuscular education;Patient/family education;Therapeutic activities    OT Goals(Current goals can be found in the care plan section) Acute Rehab OT Goals Patient Stated Goal: to be able to return to work  OT Goal Formulation: With patient Time For Goal Achievement: 07/08/19 Potential to Achieve Goals: Good  OT Frequency: Min 2X/week   Barriers to D/C:            Co-evaluation              AM-PAC OT "6 Clicks" Daily Activity     Outcome Measure Help from another person eating meals?: None Help from another person taking care of personal grooming?: None Help from another person toileting, which includes using toliet, bedpan, or urinal?: None Help from another person bathing (including washing, rinsing, drying)?: None Help from another person to put on and taking off regular upper body clothing?: None Help from another person to  put on and taking off regular lower body clothing?: None 6 Click Score: 24   End of Session    Activity Tolerance: Patient tolerated treatment well Patient left: in chair;with call bell/phone within reach;with family/visitor present (handoff to PT)  OT Visit Diagnosis: Other symptoms and signs involving the nervous system (R29.898);Dizziness and giddiness (R42)                Time: 7902-4097 OT Time Calculation (min): 20 min Charges:  OT General Charges $OT Visit: 1 Visit OT Evaluation $OT Eval Low Complexity: 1 Low  Jolaine Artist, OT Acute Rehabilitation Services Pager [phone] Office [phone]   Delight Stare 06/24/2019, 10:00 AM

## 2019-06-24 NOTE — Progress Notes (Signed)
  Echocardiogram 2D Echocardiogram has been performed.  Michiel Cowboy 06/24/2019, 8:35 AM

## 2019-06-24 NOTE — Evaluation (Signed)
Physical Therapy Evaluation Patient Details Name: Terri Waters MRN: [id number] DOB: 31-May-1955 Today's Date: 06/24/2019   History of Present Illness  Pt is a 64 y/o female with PMH significant for DM2 with peripheral neuropathy, HTN, ventricular tachycardia, R TKA, presenting with diziness and nausea. MRI brain reveals-punctate focus of restricted diffusion in the left cerebellum. L LE negative for DVT. Incidental finding of 3mm pituitary mass.   Clinical Impression  Patient received in bathroom, husband in room. She reports L LE weakness, transfers and ambulates without difficulties. Ambulated 150 feet without ad, supervision. Ambulated up/down 4 steps with single rails, supervision. She performed LE strengthening exercises mainly in standing and hand out provided. She will continue to benefit from skilled PT while here to ensure safety with mobility and continue with strengthening.      Follow Up Recommendations No PT follow up    Equipment Recommendations  None recommended by PT    Recommendations for Other Services       Precautions / Restrictions Precautions Precautions: Fall Precaution Comments: low fall Restrictions Weight Bearing Restrictions: No      Mobility  Bed Mobility               General bed mobility comments: patient in bathroom independently upon arrival, husband in room  Transfers Overall transfer level: Modified independent                  Ambulation/Gait Ambulation/Gait assistance: Modified independent (Device/Increase time) Gait Distance (Feet): 150 Feet Assistive device: None Gait Pattern/deviations: Step-through pattern Gait velocity: WNL   General Gait Details: no unsteadiness noted, no lob  Stairs Stairs: Yes Stairs assistance: Supervision Stair Management: One rail Right;Alternating pattern Number of Stairs: 4 General stair comments: safe on steps with rail  Wheelchair Mobility    Modified Rankin (Stroke Patients  Only) Modified Rankin (Stroke Patients Only) Pre-Morbid Rankin Score: No symptoms Modified Rankin: No significant disability     Balance Overall balance assessment: No apparent balance deficits (not formally assessed)                                           Pertinent Vitals/Pain Pain Assessment: No/denies pain Faces Pain Scale: Hurts a little bit Pain Location: headache after visual testing Pain Descriptors / Indicators: Headache Pain Intervention(s): Limited activity within patient's tolerance;Monitored during session    Home Living Family/patient expects to be discharged to:: Private residence Living Arrangements: Spouse/significant other Available Help at Discharge: Family Type of Home: House Home Access: Stairs to enter Entrance Stairs-Rails: Right Entrance Stairs-Number of Steps: 4 Home Layout: Two level;Bed/bath upstairs;Able to live on main level with bedroom/bathroom Home Equipment: Walker - 2 wheels      Prior Function Level of Independence: Independent         Comments: driving, working     Hand Dominance   Dominant Hand: Right    Extremity/Trunk Assessment   Upper Extremity Assessment Upper Extremity Assessment: Defer to OT evaluation LUE Deficits / Details: Strength WFL, slight dysmetric and decreased FMC  LUE Sensation: WNL LUE Coordination: decreased fine motor;decreased gross motor    Lower Extremity Assessment Lower Extremity Assessment: LLE deficits/detail LLE Sensation: WNL LLE Coordination: decreased gross motor    Cervical / Trunk Assessment Cervical / Trunk Assessment: Normal  Communication   Communication: No difficulties  Cognition Arousal/Alertness: Awake/alert Behavior During Therapy: WFL for tasks assessed/performed Overall Cognitive  Status: Within Functional Limits for tasks assessed                                        General Comments General comments (skin integrity, edema, etc.):  spouse present and supportive    Exercises Other Exercises Other Exercises: standing LE exercises: Heel raises, marching, squats, L hip abd x 10 reps each. Seated LAQ L LE x 10 reps   Assessment/Plan    PT Assessment Patient needs continued PT services  PT Problem List Decreased strength       PT Treatment Interventions Therapeutic exercise;Stair training;Gait training;Therapeutic activities;Patient/family education    PT Goals (Current goals can be found in the Care Plan section)  Acute Rehab PT Goals Patient Stated Goal: to be able to return to work  PT Goal Formulation: With patient Time For Goal Achievement: 07/01/19 Potential to Achieve Goals: Good    Frequency Min 2X/week   Barriers to discharge        Co-evaluation               AM-PAC PT "6 Clicks" Mobility  Outcome Measure Help needed turning from your back to your side while in a flat bed without using bedrails?: None Help needed moving from lying on your back to sitting on the side of a flat bed without using bedrails?: None Help needed moving to and from a bed to a chair (including a wheelchair)?: A Little Help needed standing up from a chair using your arms (e.g., wheelchair or bedside chair)?: None Help needed to walk in hospital room?: None Help needed climbing 3-5 steps with a railing? : A Little 6 Click Score: 22    End of Session Equipment Utilized During Treatment: Gait belt Activity Tolerance: Patient tolerated treatment well Patient left: in chair;with nursing/sitter in room Nurse Communication: Mobility status PT Visit Diagnosis: Muscle weakness (generalized) (M62.81);Hemiplegia and hemiparesis Hemiplegia - Right/Left: Left Hemiplegia - dominant/non-dominant: Non-dominant Hemiplegia - caused by: Cerebral infarction    Time: 0950-1005 PT Time Calculation (min) (ACUTE ONLY): 15 min   Charges:   PT Evaluation $PT Eval Moderate Complexity: 1 Mod          Latise Dilley, PT,  [facility] 06/24/19,12:44 PM

## 2019-06-24 NOTE — Discharge Summary (Signed)
[facility] Discharge Summary  Patient name: Terri Waters Medical record number: [id number] Date of birth: 24-Oct-1955 Age: 64 y.o. Gender: female Date of Admission: 06/23/2019  Date of Discharge: 06/24/2019 Admitting Physician: Gifford Shave, MD  Primary Care Provider: [name], MD Consultants: Neurology  Indication for Hospitalization: CVA  Discharge Diagnoses/Problem List:  Cerebellar infarct benign pituitary mass hypertension type 2 diabetes history of papillary carcinoma, total thyroidectomy concern for OSA  Disposition: Home  Discharge Condition: Stable improved  Discharge Exam:  Vitals:   06/24/19 1149 06/24/19 1532  BP: 128/76 (!) 106/52  Pulse: 66 87  Resp:  16  Temp:  98.1 F (36.7 C)  SpO2: 100% 100%   General: Well-appearing, no acute distress   Brief Hospital Course:  Terri Waters is a 64 y.o. female presenting with dizziness with nausea. PMH is significant for hyperlipidemia, type II diabetes with peripheral neuropathy, hypothyroidism, hypertension.  Below is her hospital course by problem.   Dizziness with nausea patient presented with several hour history of dizziness with nausea.  She reported to [facility] where she got a CT angio head and neck which showed no acute findings and then was transferred to [facility] for a head MRI.  MR brain showed subtle punctate focus of restricted diffusion within the left cerebellum only appreciated on the axial diffusion weighted sequence.  May be a tiny acute infarct.  A 4 mm rounded focus of T1 hyperintensity within the central pituitary gland was also noted.  Physical exam was significant for an intentional tremor in upper extremities bilateral but worse on the left hand.  She also had difficulty with finger-to-nose testing.  Patient was noticeably nauseated and reported worsening dizziness with ambulation.  Patient was admitted for CVA and neurology  provided recommendations.  Repeat MRI was done to evaluate pituitary mass which showed hyperintense area within the central pituitary which is otherwise normal.  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  No further imaging or evaluation necessary.  Lab work was significant for a hemoglobin A1c of 7.6.  Lipid panel showed cholesterol of 128, HDL 52, LDL 59, VLDL 17, triglycerides 83.  TSH was 3.620.  Echocardiogram showed LVEF of 55 to 60% and showed no intracardiac source of embolism.  Patient was evaluated physical therapy and Occupational Therapy who recommended no outpatient PT/OT.  Neurology recommended that patient be discharged on aspirin and Plavix for 3 weeks and then Plavix alone. She is to follow up with [facility] in 4-6 weeks.   History of papillary carcinoma, total thyroidectomy On admission patient reported that she has been taking her.  TSH was checked and was found to be within normal limits.   Type 2 diabetes Patient's blood sugar on arrival was 145.  Hemoglobin A1c checked during work-up was 7.6.  Home medications include Trulicity, glimepiride, Metformin, empagliflozin.  She will continue on his medications and follow-up with her PCP.  Issues for Follow Up:  1. Consider DC of amaryl 2. follow-up on dizziness 3. Follow-up with neurology 4. Sleep study follow-up, will need to be rescheduled given patient notes that today    Significant Procedures:  Echocardiogram 6/11  Significant Labs and Imaging:  Recent Labs  Lab 06/23/19 0711 06/24/19 0645  WBC 6.9 5.7  HGB 12.0 12.1  HCT 37.6 38.0  PLT 347 346   Recent Labs  Lab 06/23/19 0711 06/24/19 0645  NA 139 137  K 3.7 3.7  CL 98 103  CO2 30 25  GLUCOSE 167* 143*  BUN 14 10  CREATININE 1.04* 0.95  CALCIUM 8.7* 8.8*  ALKPHOS 109  --   AST 14*  --   ALT 16  --   ALBUMIN 3.7  --     Hemoglobin A1c-7.6 TSH-3.620 Lipid profile -Cholesterol 128 -HDL  52 -LDL 59 -Glycerides 83 -VLDL 17  MR BRAIN W WO CONTRAST  Result Date: 06/23/2019 CLINICAL DATA:  64 year old female with 4 mm T1 hyperintense area in the central pituitary gland on routine brain MRI earlier today. Dizziness and nausea. EXAM: MRI HEAD WITHOUT AND WITH CONTRAST TECHNIQUE: Multiplanar, multiecho pulse sequences of the brain and surrounding structures were obtained without and with intravenous contrast. CONTRAST:  7.5mL GADAVIST GADOBUTROL 1 MMOL/ML IV SOLN COMPARISON:  Brain MRI 1207 hours today. FINDINGS: Diffusion-weighted imaging was not repeated. Whole brain pre and postcontrast images were obtained. No intracranial mass effect or ventriculomegaly. In the area of heterogeneous diffusion in the left cerebellum described earlier there is a small developmental venous anomaly (normal variant series 19, image 11), which likely resulted in mild susceptibility artifact explaining the diffusion heterogeneity. No abnormal gray or white matter enhancement. No dural thickening. The major dural venous sinuses are enhancing and appear to be patent. Dedicated pituitary imaging. Overall normal pituitary size and configuration. The 3-4 mm oval intrinsic T1 and T2 hyperintense area is re-demonstrated in the central aspect of the gland on series 8, image 9. Following contrast, the gland enhances homogeneously. Normal infundibulum and suprasellar cistern. Normal hypothalamus. Normal cavernous sinus. Visualized bone marrow signal is within normal limits. Negative visible cervical spine. IMPRESSION: 1. Tiny intrinsic T1 hyperintense area within the central pituitary which is otherwise normal. 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 No further imaging evaluation is necessary. This follows ACR consensus guidelines: Management of Incidental Pituitary Findings on CT, MRI and F18-FDG PET: A White Paper of the ACR Incidental Findings Committee. J Am Coll Radiol 2018; 15:  966-72. 2. 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 Electronically Signed   By: Genevie Ann M.D.   On: 06/23/2019 20:38   ECHOCARDIOGRAM COMPLETE  Result Date: 06/24/2019    ECHOCARDIOGRAM REPORT   Patient Name:   Terri Waters [facility] Date of Exam: 06/24/2019 Medical Rec #:  [id number]      Height:       63.0 in Accession #:    [id number]     Weight:       238.0 lb Date of Birth:  September 01, 1955      BSA:          2.082 m Patient Age:    89 years       BP:           113/57 mmHg Patient Gender: F              HR:           63 bpm. Exam Location:  Inpatient Procedure: 2D Echo, Cardiac Doppler and Color Doppler                                 MODIFIED REPORT:  This report was modified by Candee Furbish MD on 06/24/2019 due to Added no source  of embolism.  Indications:     Stroke 434.91 / I163.9  History:         Patient has no prior history of Echocardiogram examinations.                  Risk Factors:Non-Smoker.  Sonographer:     Vickie Epley RDCS Referring Phys:  [place] Diagnosing Phys: Candee Furbish MD IMPRESSIONS  1. Left ventricular ejection fraction, by estimation, is 55 to 60%. The left ventricle has normal function. The left ventricle has no regional wall motion abnormalities. Left ventricular diastolic parameters were normal.  2. Right ventricular systolic function is normal. The right ventricular size is normal. There is normal pulmonary artery systolic pressure.  3. The mitral valve is normal in structure. Trivial mitral valve regurgitation. No evidence of mitral stenosis.  4. The aortic valve is normal in structure. Aortic valve regurgitation is not visualized. No aortic stenosis is present.  5. The inferior vena cava is normal in size with greater than 50% respiratory variability, suggesting right atrial pressure of 3 mmHg. Conclusion(s)/Recommendation(s): No intracardiac  source of embolism detected on this transthoracic study. A transesophageal echocardiogram is recommended to exclude cardiac source of embolism if clinically indicated. FINDINGS  Left Ventricle: Left ventricular ejection fraction, by estimation, is 55 to 60%. The left ventricle has normal function. The left ventricle has no regional wall motion abnormalities. The left ventricular internal cavity size was normal in size. There is  no left ventricular hypertrophy. Left ventricular diastolic parameters were normal. Right Ventricle: The right ventricular size is normal. No increase in right ventricular wall thickness. Right ventricular systolic function is normal. There is normal pulmonary artery systolic pressure. The tricuspid regurgitant velocity is 1.87 m/s, and  with an assumed right atrial pressure of 3 mmHg, the estimated right ventricular systolic pressure is 15.1 mmHg. Left Atrium: Left atrial size was normal in size. Right Atrium: Right atrial size was normal in size. Pericardium: There is no evidence of pericardial effusion. Mitral Valve: The mitral valve is normal in structure. Normal mobility of the mitral valve leaflets. Trivial mitral valve regurgitation. No evidence of mitral valve stenosis. Tricuspid Valve: The tricuspid valve is normal in structure. Tricuspid valve regurgitation is trivial. No evidence of tricuspid stenosis. Aortic Valve: The aortic valve is normal in structure. Aortic valve regurgitation is not visualized. No aortic stenosis is present. Pulmonic Valve: The pulmonic valve was normal in structure. Pulmonic valve regurgitation is trivial. No evidence of pulmonic stenosis. Aorta: The aortic root is normal in size and structure. Venous: The inferior vena cava is normal in size with greater than 50% respiratory variability, suggesting right atrial pressure of 3 mmHg. IAS/Shunts: No atrial level shunt detected by color flow Doppler.  LEFT VENTRICLE PLAX 2D LVIDd:         4.70 cm       Diastology LVIDs:         3.30 cm      LV e' lateral:   9.46 cm/s LV PW:         0.70 cm      LV E/e' lateral: 8.2 LV IVS:        0.70 cm      LV e' medial:    8.81 cm/s LVOT diam:     1.80 cm      LV E/e' medial:  8.9 LV SV:         56 LV SV Index:   27 LVOT Area:  2.54 cm  LV Volumes (MOD) LV vol d, MOD A2C: 81.4 ml LV vol d, MOD A4C: 101.0 ml LV vol s, MOD A2C: 32.9 ml LV vol s, MOD A4C: 42.1 ml LV SV MOD A2C:     48.5 ml LV SV MOD A4C:     101.0 ml LV SV MOD BP:      54.0 ml RIGHT VENTRICLE RV S prime:     9.68 cm/s TAPSE (M-mode): 2.2 cm LEFT ATRIUM           Index       RIGHT ATRIUM           Index LA diam:      2.60 cm 1.25 cm/m  RA Area:     12.30 cm LA Vol (A2C): 30.7 ml 14.75 ml/m RA Volume:   26.40 ml  12.68 ml/m LA Vol (A4C): 20.5 ml 9.85 ml/m  AORTIC VALVE LVOT Vmax:   88.90 cm/s LVOT Vmean:  60.200 cm/s LVOT VTI:    0.220 m  AORTA Ao Root diam: 3.10 cm MITRAL VALVE               TRICUSPID VALVE MV Area (PHT): 5.13 cm    TR Peak grad:   14.0 mmHg MV Decel Time: 148 msec    TR Vmax:        187.00 cm/s MV E velocity: 78.00 cm/s MV A velocity: 45.80 cm/s  SHUNTS MV E/A ratio:  1.70        Systemic VTI:  0.22 m                            Systemic Diam: 1.80 cm Candee Furbish MD Electronically signed by Candee Furbish MD Signature Date/Time: 06/24/2019/11:28:38 AM    Final (Updated)      Results/Tests Pending at Time of Discharge: None  Discharge Medications:  Allergies as of 06/24/2019      Reactions   Jardiance [empagliflozin]    Frequent UTI   Tape Rash   Clear plastic tape      Medication List    TAKE these medications    stroke: mapping our early stages of recovery book Misc 1 each by Does not apply route once for 1 dose.   aspirin 81 MG tablet Take 1 tablet (81 mg total) by mouth daily for 20 days.   atorvastatin 40 MG tablet Commonly known as: LIPITOR Take 40 mg by mouth daily.   clopidogrel 75 MG tablet Commonly known as: PLAVIX Take 1 tablet (75 mg total) by mouth  daily. Start taking on: June 25, 2019   glimepiride 2 MG tablet Commonly known as: AMARYL Take 2 mg by mouth 2 (two) times daily.   hydrochlorothiazide 25 MG tablet Commonly known as: HYDRODIURIL Take 25 mg by mouth daily.   levothyroxine 88 MCG tablet Commonly known as: SYNTHROID Take 44-88 mcg by mouth See admin instructions. Take 1 tablet by mouth daily except on Sundays take 44 mcg per patient   metFORMIN 500 MG 24 hr tablet Commonly known as: GLUCOPHAGE-XR Take 1,000 mg by mouth 2 (two) times daily.   metoprolol succinate 50 MG 24 hr tablet Commonly known as: TOPROL-XL Take 50 mg by mouth in the morning and at bedtime. Take with or immediately following a meal.   niacin 500 MG CR tablet Commonly known as: NIASPAN Take 500 mg by mouth daily.   olmesartan 20 MG tablet Commonly known as: BENICAR Take  20 mg by mouth 2 (two) times daily.   pantoprazole 40 MG tablet Commonly known as: PROTONIX Take 40 mg by mouth 2 (two) times daily before a meal.   potassium chloride SA 20 MEQ tablet Commonly known as: KLOR-CON Take 20 mEq by mouth 2 (two) times daily.   Trulicity 4.38 OI/7.5ZV Sopn Generic drug: Dulaglutide Inject 0.75 mg into the skin once a week.   Vitamin D3 1.25 MG (50000 UT) Caps Take 50,000 Units by mouth every Sunday.       Discharge Instructions: Please refer to Patient Instructions section of EMR for full details.  Patient was counseled important signs and symptoms that should prompt return to medical care, changes in medications, dietary instructions, activity restrictions, and follow up appointments.   Follow-Up Appointments:  Follow-up Information    [place].   Contact information: [address] [place] [address] [place], [place], MD Follow up in 6 week(s).   Specialties: Neurology, Radiology Why: stroke clinic. office will call for appt date and time. call if you do not hear from  them Contact information: [address] [place] [address] [phone]        [name], MD Follow up.   Specialty: Family Medicine Contact information: [address] [place] [address] [phone]               Gifford Shave, MD 06/24/2019, 5:14 PM PGY-1, [place]

## 2019-06-24 NOTE — Discharge Instructions (Signed)
Thank you for allowing [place] to participate in your care!    You were admitted for a possible stroke in your cerebellum.  You were seen by neurology and received 2 MRIs.  You were started on aspirin and Plavix.    - Take Asprin and Plavix together through June 30th, then stop taking the Asprin and continue Plavix/Clopidrigrel  75mg  daily  - Follow up with [facility] in 4-6 weeks (July 9-23rd)  If you experience worsening of your admission symptoms, develop shortness of breath, life threatening emergency, suicidal or homicidal thoughts you must seek medical attention immediately by calling 911 or calling your MD immediately  if symptoms less severe.    Rehabilitation After a Stroke, Adult A stroke causes damage to the brain cells, which can affect your ability to walk, talk, or remember things. The impact of a stroke is different for everyone, and so is recovery. Some people have progress during the first few days after treatment. Others may take weeks or longer to make progress. Stroke rehabilitation includes a variety of treatments to help you recover and promote your independence after a stroke. You may not be able do everything that you did before the stroke, but you can learn ways to manage your lifestyle and be as independent as possible. Rehabilitation will start as soon as you are able to participate after your stroke, and it involves care from a team that may include:  Family and friends. Your loved ones know you best and can be very helpful in your recovery.  Physicians.  Nurses.  Physical and occupational therapists.  Speech-language therapists.  A nutritionist.  A psychologist.  A Education officer, museum. Keep open communication with all members of your care team. Share your medical records if needed, and take notes about each provider's recommendations. What is physical therapy? Physical therapists (PTs) help you to improve your coordination, balance, and muscle  strength. Physical therapy may involve:  Range of motion exercises.  Help to move between lying, sitting, and standing positions.  Walking with a cane or walker, if needed.  Help using stairs. What is occupational therapy? Occupational therapists (OTs) help you rebuild your ability to do everyday tasks, such as brushing your teeth, going to the bathroom, eating, and getting dressed. Occupational therapy may also help with:  Vision. Visual scanning is a technique that is used to prevent falls.  Memory and cognitive training. This therapy includes problem-solving techniques and relearning tasks like making a phone call.  Fine muscle movements such as buttoning a shirt or picking up small objects. What is speech therapy? Speech-language therapists help you communicate. After a stroke, you may have problems understanding what people are saying, or you may have trouble writing, speaking, or finding the right word for what you want to say. You may also need speech therapy if you have difficulty swallowing while eating and drinking. Examples of speech-language therapies include:  Techniques to strengthen muscles used in swallowing.  Naming objects or describing pictures. This helps retrain the brain to recognize and remember words.  Exercises to strengthen the muscles involved in talking, including your tongue and lips.  Exercises to retrain your brain in understanding what you read and hear. How often will I need therapy? Therapy will begin as soon as you are able to participate, which is often within the first few days after a stroke. Sessions will be frequent at first. For example, you may have therapy 2-3 hours a day on most days of the week during  the first few months. The intensity depends on the type and severity of your stroke. You may need therapy for several months. Therapy may take place in the hospital, at a rehabilitation center, or in your home. Are there any side effects of  therapy? Therapy is safe and is usually well-tolerated. You may feel physically and mentally tired after therapy, especially during the first few weeks. Rest before therapy sessions if you need to so you can get the most out of your rehabilitation. Follow these instructions at home:  Involve your family and friends in your recovery, if possible. Having another person to encourage you is beneficial.  Follow instructions from your speech-language therapist, nutritionist, or health care provider about what you can safely eat and drink. Eat healthy foods. If your ability to swallow was affected by the stroke, you may need to take steps to avoid choking, such as: ? Taking small bites when eating. ? Eating foods that are soft or pureed. ? Drinking liquids that have been thickened.  Maintain social connections and interactions with friends, family, and community groups. This is an important part of your recovery. Communication challenges and physical challenges may cause you to feel isolated after a stroke.  Consider joining a support group that allows you to talk about the impact of stroke on your life. A psychologist or counselor may be recommended. Your emotional recovery from stroke is just as important as your physical recovery.  Keep all follow-up visits as told by your health care providers. This is important. Summary  Stroke rehabilitation includes a variety of treatments to help you recover and promote your independence after a stroke.  Rehabilitation will start as soon as you are able to participate after your stroke, and it includes care from a team of experts.  The intensity of therapy depends on the type and severity of your stroke. You may need therapy for several months. This information is not intended to replace advice given to you by your health care provider. Make sure you discuss any questions you have with your health care provider. Document Revised: 04/21/2018 Document  Reviewed: 01/01/2016 Elsevier Patient Education  [place].

## 2019-06-24 NOTE — Progress Notes (Signed)
FPTS Interim Progress Note  S: Patient was seen and evaluated today.  She reported that she had 2 episodes of dizziness overnight that occurred after she stood up and went to the bathroom.  She reported that when she laid back down was sitting still for like the entire room was spinning.  Patient reports that she has not had any nausea or vomiting overnight.  Patient has questions regarding the results of her MRI and neck steps in recovery.  I spoke with the neurologist who reported that Burnetta Sabin will be seeing the patient today and that they will be dropping their final recommendations at that time.  O: BP 128/76   Pulse 66   Temp 97.8 F (36.6 C) (Oral)   Resp 17   Ht 5\' 3"  (1.6 m)   Wt 108 kg   SpO2 100%   BMI 42.16 kg/m   General: Laying in bed having an echocardiogram completed at the time of evaluation Respiratory: Normal work of breathing, lungs are clear to auscultation bilaterally Cardio: Regular rate and rhythm Abdomen: Abdomen is soft, nontender, positive bowel sounds Neuro: Unable to perform given she is having an echocardiogram done at the time of evaluation.  Patient's grip strength and lower extremity strength are equal bilateral.  Please see attending's attestation for full physical.  A/P: Terri Waters is a 64 y.o. female presenting with dizziness with nausea. PMH is significant for hyperlipidemia, type II diabetes with peripheral neuropathy, hypothyroidism, hypertension  CVA Patient symptoms seem to be improving. -Neurology is following, appreciate recommendations -Follow-up on PT/OT evaluation -Patient may discharge this afternoon -Full discharge summary to follow.  Gifford Shave, MD 06/24/2019, 2:53 PM PGY-1, [place] [facility] Service pager [phone]
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
## 2019-06-27 ENCOUNTER — Telehealth: Payer: Self-pay | Admitting: Adult Health

## 2019-06-27 NOTE — Telephone Encounter (Signed)
Patient LVM. Recently hospitalized. Advised by Dr. Leonie Man to call for follow-up appointment.

## 2019-07-14 ENCOUNTER — Encounter: Payer: Self-pay | Admitting: Neurology

## 2019-07-14 ENCOUNTER — Ambulatory Visit (INDEPENDENT_AMBULATORY_CARE_PROVIDER_SITE_OTHER): Payer: 59 | Admitting: Neurology

## 2019-07-14 VITALS — BP 126/68 | HR 75 | Ht 63.0 in | Wt 242.0 lb

## 2019-07-14 DIAGNOSIS — H538 Other visual disturbances: Secondary | ICD-10-CM

## 2019-07-14 DIAGNOSIS — R42 Dizziness and giddiness: Secondary | ICD-10-CM

## 2019-07-14 DIAGNOSIS — E236 Other disorders of pituitary gland: Secondary | ICD-10-CM

## 2019-07-14 NOTE — Patient Instructions (Signed)
I had a long discussion with the patient and her husband regarding her recent episode of vertigo and discussed the results of abnormal MRI scan of the brain and answered questions.  I am not convinced that the tiny MRI abnormality in the left cerebellum necessarily represents an infarct she does have a incidental venous angioma at that site which is unlikely to explain the symptoms.  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  She has a new complaints of headache and blurred vision hence recommend repeating MRI scan of the brain and checking pituitary hormones.  I advised her to see her eye doctor for blurred vision as well as her endocrinologist for follow-up on pituitary hormones.  She was advised to stay out of work for a month till above evaluation is completed.  She will return for follow-up with me in 3 months with my nurse practitioner Janett Billow or call earlier if necessary,  Benign Positional Vertigo Vertigo is the feeling that you or your surroundings are moving when they are not. Benign positional vertigo is the most common form of vertigo. This is usually a harmless condition (benign). This condition is positional. This means that symptoms are triggered by certain movements and positions. This condition can be dangerous if it occurs while you are doing something that could cause harm to you or others. This includes activities such as driving or operating machinery. What are the causes? In many cases, the cause of this condition is not known. It may be caused by a disturbance in an area of the inner ear that helps your brain to sense movement and balance. This disturbance can be caused by:  Viral infection (labyrinthitis).  Head injury.  Repetitive motion, such as jumping, dancing, or running. What increases the risk? You are more likely to develop this condition if:  You are a woman.  You are 45  years of age or older. What are the signs or symptoms? Symptoms of this condition usually happen when you move your head or your eyes in different directions. Symptoms may start suddenly, and usually last for less than a minute. They include:  Loss of balance and falling.  Feeling like you are spinning or moving.  Feeling like your surroundings are spinning or moving.  Nausea and vomiting.  Blurred vision.  Dizziness.  Involuntary eye movement (nystagmus). Symptoms can be mild and cause only minor problems, or they can be severe and interfere with daily life. Episodes of benign positional vertigo may return (recur) over time. Symptoms may improve over time. How is this diagnosed? This condition may be diagnosed based on:  Your medical history.  Physical exam of the head, neck, and ears.  Tests, such as: ? MRI. ? CT scan. ? Eye movement tests. Your health care provider may ask you to change positions quickly while he or she watches you for symptoms of benign positional vertigo, such as nystagmus. Eye movement may be tested with a variety of exams that are designed to evaluate or stimulate vertigo. ? An electroencephalogram (EEG). This records electrical activity in your brain. ? Hearing tests. You may be referred to a health care provider who specializes in ear, nose, and throat (ENT) problems (otolaryngologist) or a provider who specializes in disorders of the nervous system (neurologist). How is this treated?  This condition may be treated in a session in which your health care provider moves your head in specific positions to adjust  your inner ear back to normal. Treatment for this condition may take several sessions. Surgery may be needed in severe cases, but this is rare. In some cases, benign positional vertigo may resolve on its own in 2-4 weeks. Follow these instructions at home: Safety  Move slowly. Avoid sudden body or head movements or certain positions, as told by  your health care provider.  Avoid driving until your health care provider says it is safe for you to do so.  Avoid operating heavy machinery until your health care provider says it is safe for you to do so.  Avoid doing any tasks that would be dangerous to you or others if vertigo occurs.  If you have trouble walking or keeping your balance, try using a cane for stability. If you feel dizzy or unstable, sit down right away.  Return to your normal activities as told by your health care provider. Ask your health care provider what activities are safe for you. General instructions  Take over-the-counter and prescription medicines only as told by your health care provider.  Drink enough fluid to keep your urine pale yellow.  Keep all follow-up visits as told by your health care provider. This is important. Contact a health care provider if:  You have a fever.  Your condition gets worse or you develop new symptoms.  Your family or friends notice any behavioral changes.  You have nausea or vomiting that gets worse.  You have numbness or a "pins and needles" sensation. Get help right away if you:  Have difficulty speaking or moving.  Are always dizzy.  Faint.  Develop severe headaches.  Have weakness in your legs or arms.  Have changes in your hearing or vision.  Develop a stiff neck.  Develop sensitivity to light. Summary  Vertigo is the feeling that you or your surroundings are moving when they are not. Benign positional vertigo is the most common form of vertigo.  The cause of this condition is not known. It may be caused by a disturbance in an area of the inner ear that helps your brain to sense movement and balance.  Symptoms include loss of balance and falling, feeling that you or your surroundings are moving, nausea and vomiting, and blurred vision.  This condition can be diagnosed based on symptoms, physical exam, and other tests, such as MRI, CT scan, eye  movement tests, and hearing tests.  Follow safety instructions as told by your health care provider. You will also be told when to contact your health care provider in case of problems. This information is not intended to replace advice given to you by your health care provider. Make sure you discuss any questions you have with your health care provider. Document Revised: 06/10/2017 Document Reviewed: 06/10/2017 Elsevier Patient Education  [place].

## 2019-07-14 NOTE — Progress Notes (Signed)
[facility] [address] [place]. [place] [address] [phone]       OFFICE FOLLOW-UP NOTE  [name] Date of Birth:  04-12-1955 Medical Record Number:  [id number]   HPI:  ROS:   14 system review of systems is positive for dizziness, vertigo, blurred vision, headache and all other systems negative.  [name] has a past medical history of diabetes, hypertension hyperlipidemia who presented on 06/23/2019 with sudden onset of acute vertigo.  This happened spontaneously while she was on the toilet.  It persisted for several hours and she was sick to her stomach and had trouble walking and had to hold on.  She was seen by Dr. Leonel Ramsay and noted to have some left-sided clumsiness and incoordination.  MRI scan of the brain was obtained which I personally reviewed shows a very questionable tiny punctate left cerebellar deep white matter hyperintensity which is not correspondingly dark on the ADC map.  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  MRI also showed pituitary hyperintensity possibly a cyst versus small adenoma.  CT angiogram showed no significant large vessel stenosis or occlusion.  LDL cholesterol 59.  A1c was 7.6.  Transthoracic echo was unremarkable.  She was felt to likely have had a small brainstem infarct and started on aspirin and Plavix.  Patient states she is done well she is tolerating both medications well without bruising or bleeding.  She stated that her dizziness had improved until 2 days ago when she had a brief episode of positional vertigo when she was in the kitchen and try to sit down the room started spinning she was able to hold down and the feeling passed after a minute or so and she was fine.  She felt nauseous but did not throw up.  Patient also had 2 weeks of new onset headaches which was severe bifrontal moderate in intensity.  No other headaches seem to have gone.  She is also noted blurred vision with the headaches which  has persisted.  She has trouble reading and looking at small objects.  She denies complete loss of vision.  She denies any jaw claudication, muscle aches or pains.  She stated that she gets tired and exhausted easily.  She denies any history of ear pain, infection or discharge.  She states she is unable to work because of difficulty reading and wants to be on medical leave.  She was advised to see her diabetes specialist endocrinologist to consider evaluation for pituitary hormones but she has not yet made that appointment. PMH:  Past Medical History:  Diagnosis Date  . Cancer (HCC)    papillary carcinoma  . Diabetes mellitus without complication ([place])   . High cholesterol   . Hypertension   . Hypothyroidism   . Migraines   . Stroke ([place])   . Thyroid disease   . Ventricular tachycardia ([place])     Social History:  Social History   Socioeconomic History  . Marital status: Married    Spouse name: Not on file  . Number of children: Not on file  . Years of education: Not on file  . Highest education level: Not on file  Occupational History  . Not on file  Tobacco Use  . Smoking status: Never Smoker  . Smokeless tobacco: Never Used  Vaping Use  . Vaping Use: Never used  Substance and Sexual Activity  . Alcohol use: No  . Drug use: No  . Sexual activity: Not on file  Other Topics Concern  . Not on file  Social History Narrative  . Not on file   Social Determinants of Health   Financial Resource Strain:   . Difficulty of Paying Living Expenses:   Food Insecurity:   . Worried About Charity fundraiser in the Last Year:   . Arboriculturist in the Last Year:   Transportation Needs:   . Film/video editor (Medical):   Marland Kitchen Lack of Transportation (Non-Medical):   Physical Activity:   . Days of Exercise per Week:   . Minutes of Exercise per Session:   Stress:   . Feeling of Stress :   Social Connections:   . Frequency of Communication with Friends and Family:   . Frequency  of Social Gatherings with Friends and Family:   . Attends Religious Services:   . Active Member of Clubs or Organizations:   . Attends Archivist Meetings:   Marland Kitchen Marital Status:   Intimate Partner Violence:   . Fear of Current or Ex-Partner:   . Emotionally Abused:   Marland Kitchen Physically Abused:   . Sexually Abused:     Medications:   Current Outpatient Medications on File Prior to Visit  Medication Sig Dispense Refill  . aspirin 81 MG tablet Take 1 tablet (81 mg total) by mouth daily for 20 days. 20 tablet 0  . Aspirin Buf,CaCarb-MgCarb-MgO, 81 MG TABS aspirin 81 mg tablet   81 mg by oral route.    Marland Kitchen atorvastatin (LIPITOR) 40 MG tablet Take 40 mg by mouth daily.    . busPIRone (BUSPAR) 5 MG tablet Take by mouth.    . Cholecalciferol (VITAMIN D3) 1.25 MG (50000 UT) CAPS Take 50,000 Units by mouth every Sunday.    . Cholecalciferol 125 MCG (5000 UT) capsule Take by mouth.    . clopidogrel (PLAVIX) 75 MG tablet Take 1 tablet (75 mg total) by mouth daily. 90 tablet 0  . clotrimazole-betamethasone (LOTRISONE) cream clotrimazole-betamethasone 1 %-0.05 % topical cream    . conjugated estrogens (PREMARIN) vaginal cream INSERT 1/4 TH GRAM  VAGINALLY TWICE WEEKLY AS  DIRECTED    . cyanocobalamin (,VITAMIN B-12,) 1000 MCG/ML injection Inject 1,000 mcg as directed 4 (four) times a week. Start with daily injections for 4 days then weekly for 4 weeks.    Marland Kitchen diltiazem (TIAZAC) 120 MG 24 hr capsule diltiazem CD 120 mg capsule,extended release 24 hr    . Dulaglutide (TRULICITY) 5.39 JQ/7.3AL SOPN Trulicity 9.37 TK/2.4 mL subcutaneous pen injector    . glimepiride (AMARYL) 2 MG tablet Take 2 mg by mouth 2 (two) times daily.     Marland Kitchen glimepiride (AMARYL) 2 MG tablet Take by mouth.    . olmesartan (BENICAR) 20 MG tablet Take 2 tablets by mouth daily.    Marland Kitchen atorvastatin (LIPITOR) 40 MG tablet Take 1 tablet by mouth daily. (Patient not taking: Reported on 07/14/2019)    . Dulaglutide (TRULICITY [place]) Inject into  the skin. (Patient not taking: Reported on 07/14/2019)    . hydrochlorothiazide (HYDRODIURIL) 25 MG tablet Take 25 mg by mouth daily.    Marland Kitchen levothyroxine (SYNTHROID) 88 MCG tablet Take 44-88 mcg by mouth See admin instructions. Take 1 tablet by mouth daily except on Sundays take 44 mcg per patient    . LEVOTHYROXINE SODIUM PO levothyroxine    . MAGNESIUM PO Take by mouth.    . metFORMIN (GLUCOPHAGE) 1000 MG tablet metformin 1,000 mg tablet  Take 1 tablet twice a day by  oral route.    . metFORMIN (GLUCOPHAGE-XR) 500 MG 24 hr tablet Take 1,000 mg by mouth 2 (two) times daily.    . metoprolol succinate (TOPROL-XL) 50 MG 24 hr tablet Take 50 mg by mouth in the morning and at bedtime. Take with or immediately following a meal.     . niacin (NIASPAN) 500 MG CR tablet Take 500 mg by mouth daily.    . niacin (NIASPAN) 500 MG CR tablet Take by mouth.    Marland Kitchen NIACIN CR PO Niacin CR 400 mg capsule,extended release  Take by oral route.    Marland Kitchen olmesartan (BENICAR) 20 MG tablet Take 20 mg by mouth 2 (two) times daily.     . pantoprazole (PROTONIX) 40 MG tablet Take 40 mg by mouth 2 (two) times daily before a meal.    . potassium chloride SA (KLOR-CON) 20 MEQ tablet Take 20 mEq by mouth 2 (two) times daily.    . TRULICITY 2.29 NL/8.9QJ SOPN Inject 0.75 mg into the skin once a week.    . Zinc Methionate 50 MG CAPS Take by mouth.     No current facility-administered medications on file prior to visit.    Allergies:   Allergies  Allergen Reactions  . Jardiance [Empagliflozin]     Frequent UTI  . Tape Rash    Clear plastic tape    Physical Exam General: Mildly obese middle-aged African-American lady, seated, in no evident distress Head: head normocephalic and atraumatic.  Neck: supple with no carotid or supraclavicular bruits Cardiovascular: regular rate and rhythm, no murmurs Musculoskeletal: no deformity Skin:  no rash/petichiae Vascular:  Normal pulses all extremities Vitals:   07/14/19 1503  BP:  126/68  Pulse: 75   Neurologic Exam Mental Status: Awake and fully alert. Oriented to place and time. Recent and remote memory intact. Attention span, concentration and fund of knowledge appropriate. Mood and affect appropriate.  Cranial Nerves: Fundoscopic exam reveals sharp disc margins. Pupils equal, briskly reactive to light. Extraocular movements full without nystagmus. Visual fields full to confrontation.  Subjective blurred vision but able to count fingers at 6 feet.  Slight difficulty with reading.  Hearing intact. Facial sensation intact. Face, tongue, palate moves normally and symmetrically.  Motor: Normal bulk and tone. Normal strength in all tested extremity muscles. Sensory.: intact to touch ,pinprick .position and vibratory sensation.  Coordination: Rapid alternating movements normal in all extremities. Finger-to-nose and heel-to-shin performed accurately bilaterally. Gait and Station: Arises from chair without difficulty. Stance is normal. Gait demonstrates normal stride length and balance . Able to heel, toe and tandem walk without difficulty.  Reflexes: 1+ and symmetric. Toes downgoing.   NIHSS  0 Modified Rankin  1   ASSESSMENT: 64 year old African-American lady with episode of transient recurrent vertigo since June 2021 likely benign paroxysmal positional vertigo.  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  Recent episode of new onset headache for 2 weeks which appears to have resolved and is of unclear etiology.  New subjective complaints of bilateral blurred vision of unclear etiology.     PLAN: I had a long discussion with the patient and her husband regarding her recent episode of vertigo and discussed the results of abnormal MRI scan of the brain and answered questions.  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  She has a new complaints of headache and blurred vision hence recommend repeating MRI scan of the brain and checking pituitary hormones.  I advised her to see her eye doctor for blurred vision as well as her endocrinologist for follow-up on pituitary hormones.  She was advised to stay out of work for a month till above evaluation is completed.  She was advised to see her primary care physician and seek referral to eye doctor to evaluate her blurred vision.  She will return for follow-up with me in 3 months with my nurse practitioner Janett Billow or call earlier if necessary, Greater than 50% of time during this 35 minute visit was spent on counseling,explanation of diagnosis of vertigo, pituitary cyst, planning of further management, discussion with patient and family and coordination of care Antony Contras, MD  [facility] [address] [place], [place] [address]  Phone [phone] Fax [phone] Note: This document was prepared with digital dictation and possible smart phrase technology. Any transcriptional errors that result from this process are unintentional Young dictating.

## 2019-07-15 LAB — FOLLICLE STIMULATING HORMONE: FSH: 52.9 m[IU]/mL

## 2019-07-15 LAB — ACTH: ACTH: 194 pg/mL — ABNORMAL HIGH (ref 7.2–63.3)

## 2019-07-15 LAB — PROLACTIN: Prolactin: 8.4 ng/mL (ref 4.8–23.3)

## 2019-07-15 LAB — GROWTH HORMONE: Growth Hormone: 3 ng/mL (ref 0.0–10.0)

## 2019-07-15 LAB — LUTEINIZING HORMONE: LH: 35.6 m[IU]/mL

## 2019-07-15 NOTE — Progress Notes (Signed)
Kindly inform the patient that her [place] and LH hormone levels are normal rest of the pituitary hormone levels are not yet back

## 2019-07-21 ENCOUNTER — Telehealth: Payer: Self-pay | Admitting: Neurology

## 2019-07-21 NOTE — Telephone Encounter (Signed)
Patient returned my call she is scheduled at [facility] for 07/26/19.

## 2019-07-21 NOTE — Telephone Encounter (Signed)
LVM for pt to call back about scheduling MRI  [facility] Auth: [id number] (exp. 07/21/19 to 09/04/19)

## 2019-07-26 ENCOUNTER — Other Ambulatory Visit: Payer: Self-pay

## 2019-07-26 ENCOUNTER — Ambulatory Visit (INDEPENDENT_AMBULATORY_CARE_PROVIDER_SITE_OTHER): Payer: 59

## 2019-07-26 DIAGNOSIS — E236 Other disorders of pituitary gland: Secondary | ICD-10-CM

## 2019-07-26 DIAGNOSIS — H538 Other visual disturbances: Secondary | ICD-10-CM | POA: Diagnosis not present

## 2019-07-26 DIAGNOSIS — R42 Dizziness and giddiness: Secondary | ICD-10-CM | POA: Diagnosis not present

## 2019-07-26 MED ORDER — GADOBENATE DIMEGLUMINE 529 MG/ML IV SOLN
15.0000 mL | Freq: Once | INTRAVENOUS | Status: AC | PRN
  Administered 2019-07-26: 15 mL via INTRAVENOUS

## 2019-07-28 ENCOUNTER — Other Ambulatory Visit: Payer: Self-pay

## 2019-07-28 ENCOUNTER — Encounter: Payer: Self-pay | Admitting: Physical Therapy

## 2019-07-28 ENCOUNTER — Ambulatory Visit: Payer: 59 | Attending: Neurology | Admitting: Physical Therapy

## 2019-07-28 VITALS — BP 109/62 | HR 75

## 2019-07-28 DIAGNOSIS — R42 Dizziness and giddiness: Secondary | ICD-10-CM

## 2019-07-28 DIAGNOSIS — R2681 Unsteadiness on feet: Secondary | ICD-10-CM | POA: Insufficient documentation

## 2019-07-28 NOTE — Therapy (Signed)
[facility] [address]  [place], [place], [address] Phone: [phone]   Fax:  [phone]  Physical Therapy Evaluation  Patient Details  Name: Terri Waters MRN: [id number] Date of Birth: 1955-09-17 Referring Provider (PT): Antony Contras, MD   Encounter Date: 07/28/2019   PT End of Session - 07/28/19 1721    Visit Number 1    Number of Visits 13    Date for PT Re-Evaluation 09/08/19    Authorization Type UHC    PT Start Time 1611    PT Stop Time 1702    PT Time Calculation (min) 51 min    Equipment Utilized During Treatment Gait belt    Activity Tolerance Patient tolerated treatment well    Behavior During Therapy [facility] for tasks assessed/performed           Past Medical History:  Diagnosis Date  . Cancer (HCC)    papillary carcinoma  . Diabetes mellitus without complication ([place])   . High cholesterol   . Hypertension   . Hypothyroidism   . Migraines   . Stroke ([place])   . Thyroid disease   . Ventricular tachycardia [facility])     Past Surgical History:  Procedure Laterality Date  . ABDOMINAL HYSTERECTOMY    . BREAST SURGERY     lumps removed - over 20 years ago  . THYROIDECTOMY    . TOTAL KNEE ARTHROPLASTY Right 12/18/2017   Procedure: RIGHT TOTAL KNEE ARTHROPLASTY;  Surgeon: Netta Cedars, MD;  Location: [facility];  Service: Orthopedics;  Laterality: Right;    Vitals:   07/28/19 1612  BP: 109/62  Pulse: 75  SpO2: 98%      Subjective Assessment - 07/28/19 1612    Subjective Patient reports HAs since sudden onset of vertigo in June. Still gets episodes of dizziness which last 1-2 minutes. Aggravated by standing up out of a chair, walking, or sitting up out of bed. Describes dizziness as "equilibrium is off." She is on blood pressure meds, but this has not changed recently. Denies unsteadiness but the L leg will buckle underneath her intermittently- denies falls. The week of her first episode of  dizziness, she was confused and couldn't remember her password at work, then the next day she had a terrible headache in the back of her head. Dizziness began on the day after that. Denies new onset of N/T, dysarthria, dysphagia, or head trauma. Denies ear pain or drainage, hearing loss, or tinnitus. Does feel sensation of pressure in the L ear.    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 Yes    Pain Score 3     Pain Location Head    Pain Orientation Anterior   forehead   Pain Descriptors / Indicators Aching    Pain Type Acute pain              [facility] PT Assessment - 07/28/19 1624      Assessment   Medical Diagnosis Vertigo    Referring Provider (PT) Antony Contras, MD    Onset Date/Surgical Date 06/23/19    Hand Dominance Right    Prior Therapy  yes- R TKA      Precautions   Precautions None      Balance Screen   Has the patient fallen in the past 6 months No    Has the patient had a decrease in activity level because of a fear of falling?  No    Is the patient reluctant to leave their home because of a fear of falling?  No      Home Environment   Living Environment Private residence    Living Arrangements Spouse/significant other;Children    Available Help at Discharge Family    Type of [place] to enter    Entrance Stairs-Number of Steps 4    Entrance Stairs-Rails Right;Left    Home Layout Two level    Alternate Level Stairs-Number of Steps [place] - 2 wheels      Prior Function   Level of Independence Independent    Vocation Full time  employment    Vocation Requirements working from home    Leisure gym, [employer], travel      Ambulation/Gait   Assistive device None    Gait Pattern Within Functional Limits    Ambulation Surface Level;Indoor    Gait velocity [facility]                  Vestibular Assessment - 07/28/19 1630      Oculomotor Exam   Oculomotor Alignment Normal    Ocular ROM WNL    Spontaneous Absent    Gaze-induced  Absent   c/o dizziness on L gaze   Head shaking Horizontal Absent   B normal HIT   Smooth Pursuits Intact    Saccades Slow;Undershoots   vertical and horizontal directions   Comment convergence: 4/10 dizziness but convergence intact      Vestibulo-Ocular Reflex   VOR 1 Head Only (x 1 viewing) 2/10 dizziness horizontal, 0/10 vertical   good ability to fixate   VOR Cancellation Unable to maintain gaze   5/10 dizziness and c/o double vision     Positional Testing   Dix-Hallpike Dix-Hallpike Right;Dix-Hallpike Left    Horizontal Canal Testing Horizontal Canal Right;Horizontal Canal Left      Dix-Hallpike Right   Dix-Hallpike Right Symptoms No nystagmus      Dix-Hallpike Left   Dix-Hallpike Left Symptoms No nystagmus   c/o dizziness     Horizontal Canal Right   Horizontal Canal Right Symptoms Normal      Horizontal Canal Left   Horizontal Canal Left Symptoms Normal   c/o dizziness     Environmental education officer Comment M-CTSIB: EO/firm- mild sway, EC/firm- mild sway, EO/foam- midl/mod sway, EC/foam- moderate sway    slightly increased sway to L             Objective measurements completed on examination: See above findings.               PT Education - 07/28/19 1720    Education Details prognosis, POC, HEP, edu on possible causes of patient's dizziness, advised to f/u with PCP for c/o L ear pressure    Person(s) Educated Patient    Methods Explanation;Demonstration;Verbal cues;Tactile cues;Handout    Comprehension Verbalized understanding;Returned  demonstration            PT Short Term Goals - 07/28/19 1727      PT SHORT TERM GOAL #1   Title Independent  with initial HEP    Time 3    Period Weeks    Status New    Target Date 08/18/19             PT Long Term Goals - 07/28/19 1728      PT LONG TERM GOAL #1   Title Independent with ongoing/advanced HEP    Time 6    Period Weeks    Status New    Target Date 09/08/19      PT LONG TERM GOAL #2   Title Patient to report 70% improvement in dizziness.    Time 6    Period Weeks    Status New    Target Date 09/08/19      PT LONG TERM GOAL #3   Title Patient to demonstrate standing horizontal head turns with gaze fixed on target with 0/10 dizziness.    Time 6    Period Weeks    Status New    Target Date 09/08/19      PT LONG TERM GOAL #4   Title Patient to demonstrate mild sway with M-CTSIB condition EC/foam.    Time 6    Period Weeks    Status New    Target Date 09/08/19                  Plan - 07/28/19 1722    Clinical Impression Statement Patient is a 64y/o F presenting to [facility] with c/o dizziness since first onset on 06/23/19. MRI imaging showed punctate left cerebellar lesion, stable on more recent scan on 07/26/19. Patient also reporting HAs in the anterior or posterior head. Dizziness lasts 1-2 minutes and worse with standing up out of a chair, walking, or sitting up out of bed. Denies new onset of N/T, dysarthria, dysphagia, head trauma, ear pain or drainage, hearing loss, or tinnitus. Does feel sensation of pressure in the L ear. Oculomotor exam revealed decreased speed and undershooting with saccadic testing, dizziness with L gaze and convergence, dizziness with horizontal VOR, and inability to maintain gaze with VOR cancellation. Patient reported slightly increased motion sensitivity with positional testing to L and unsteadiness with decreased visual and proprioceptive input. Patient did note increased "pressure" in L ear, thus encouraged patient to  f/u with PCP for further work up. Educated patient on VOR and balance HEP- patient reported understanding. Would benefit from skilled PT services 1-2x/week for 6 weeks to address aforementioned impairments.    Personal Factors and Comorbidities Age;Comorbidity 3+;Profession;Past/Current Experience;Time since onset of injury/illness/exacerbation    Comorbidities stroke, migraines, hypothyroidism, HTN, HLD, DM, hx breast CA, R TKA    Examination-Activity Limitations Sleep;Bed Mobility;Bend;Squat;Stairs;Carry;Stand;Dressing;Transfers;Hygiene/Grooming;Lift;Locomotion Level;Reach Overhead;Bathing    Examination-Participation Restrictions Church;Cleaning;Shop;Community Activity;Driving;Yard Work;Interpersonal Relationship;Laundry;Meal Prep    Stability/Clinical Decision Making Stable/Uncomplicated    Clinical Decision Making Low    Rehab Potential Good    PT Frequency Other (comment)   1-2x   PT Duration 6 weeks    PT Treatment/Interventions ADLs/Self Care Home Management;Canalith Repostioning;Cryotherapy;Moist Heat;Balance training;Therapeutic exercise;Therapeutic activities;Functional mobility training;Stair training;Gait training;Neuromuscular re-education;Patient/family education;Manual techniques;Vestibular;Energy conservation;Passive range of motion    PT Next Visit Plan reassess HEP    Consulted and Agree with Plan of Care Patient           Patient will benefit from skilled therapeutic intervention in order to improve the following deficits and impairments:  Decreased activity tolerance, Decreased balance, Difficulty walking, Decreased range of motion, Improper body mechanics, Postural dysfunction, Impaired flexibility, Dizziness  Visit Diagnosis: Dizziness and giddiness  Unsteadiness on feet     Problem List Patient Active Problem List   Diagnosis Date Noted  . 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 Migraine with aura and  without status migrainosus, not intractable 04/09/2017    Janene Harvey, PT, DPT 07/28/19 5:30 PM   [facility] [address]  [place], [place], [address] Phone: [phone]   Fax:  [phone]  Name: KAYDANCE BOWIE MRN: [id number] Date of Birth: January 08, 1956

## 2019-07-31 ENCOUNTER — Other Ambulatory Visit: Payer: Self-pay | Admitting: Neurology

## 2019-07-31 DIAGNOSIS — E236 Other disorders of pituitary gland: Secondary | ICD-10-CM

## 2019-07-31 NOTE — Progress Notes (Signed)
Kindly inform patient that prolactin level and growth hormone levels are normal but ACTH level is elevated.  Patient needs to see the endocrinologist for the same and I am making referral in epic to endocrinology

## 2019-08-01 ENCOUNTER — Other Ambulatory Visit: Payer: Self-pay

## 2019-08-01 ENCOUNTER — Encounter: Payer: Self-pay | Admitting: Physical Therapy

## 2019-08-01 ENCOUNTER — Ambulatory Visit: Payer: 59 | Admitting: Physical Therapy

## 2019-08-01 DIAGNOSIS — R2681 Unsteadiness on feet: Secondary | ICD-10-CM

## 2019-08-01 DIAGNOSIS — R42 Dizziness and giddiness: Secondary | ICD-10-CM
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
## 2019-08-02 ENCOUNTER — Telehealth: Payer: Self-pay | Admitting: Neurology

## 2019-08-02 ENCOUNTER — Other Ambulatory Visit: Payer: Self-pay | Admitting: Neurology

## 2019-08-02 MED ORDER — TOPIRAMATE 25 MG PO TABS: 25.0000 mg | ORAL_TABLET | Freq: Two times a day (BID) | ORAL | 3 refills | Status: DC

## 2019-08-02 NOTE — Telephone Encounter (Signed)
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  I recommend a trial of Topamax 25 mg twice daily to help with her frequent migraines.

## 2019-08-02 NOTE — Telephone Encounter (Signed)
Called the patient and reviewed the MRI of brain was overall negative of any new findings. 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 Advised the patient also that he recommends a medication called topamax to used as migraine/headache prevention. Informed her how to take the medication and the side effects associated. Advised if the patient has problems to call us back. She verbalized understanding and had no further questions.

## 2019-08-02 NOTE — Telephone Encounter (Signed)
Called the patient and discussed. The patient has seen endocrinology because she is already has one. They have evaluated he labs results and didn't have anything to add or feel anything was abnormal.   She wanted to mention that her headache is continuing to linger. She has them daily. She states that its bothersome when she is looking at computer screens as well, light intensifies. She asked about the MRI results and I didn't see where that was actually resulted yet. Advised I would discuss with Dr. Leonie Man to see what the MRI shows as well as if he has suggestions of a preventative medication.

## 2019-08-02 NOTE — Telephone Encounter (Signed)
-----   Message from Garvin Fila, MD sent at 07/31/2019  3:40 PM EDT ----- Mitchell Heir inform patient that prolactin level and growth hormone levels are normal but ACTH level is elevated.  Patient needs to see the endocrinologist for the same and I am making referral in epic to endocrinology

## 2019-08-02 NOTE — Progress Notes (Signed)
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  Is unchanged from prior scan and nothing to worry about

## 2019-08-03 ENCOUNTER — Ambulatory Visit: Payer: 59 | Admitting: Physical Therapy

## 2019-09-09 ENCOUNTER — Ambulatory Visit: Payer: 59 | Admitting: Endocrinology

## 2019-10-03 ENCOUNTER — Ambulatory Visit: Payer: 59 | Admitting: Neurology

## 2020-07-03 ENCOUNTER — Encounter: Payer: Self-pay | Admitting: Physical Therapy

## 2020-07-03 ENCOUNTER — Other Ambulatory Visit: Payer: Self-pay

## 2020-07-03 ENCOUNTER — Ambulatory Visit: Payer: 59 | Attending: Family Medicine | Admitting: Physical Therapy

## 2020-07-03 DIAGNOSIS — M542 Cervicalgia: Secondary | ICD-10-CM | POA: Diagnosis present

## 2020-07-03 DIAGNOSIS — M6281 Muscle weakness (generalized): Secondary | ICD-10-CM | POA: Diagnosis present

## 2020-07-03 DIAGNOSIS — M62838 Other muscle spasm: Secondary | ICD-10-CM | POA: Diagnosis present
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
## 2020-07-10 ENCOUNTER — Ambulatory Visit: Payer: 59 | Admitting: Physical Therapy

## 2020-07-10 ENCOUNTER — Encounter: Payer: Self-pay | Admitting: Physical Therapy

## 2020-07-10 ENCOUNTER — Other Ambulatory Visit: Payer: Self-pay

## 2020-07-10 DIAGNOSIS — M6281 Muscle weakness (generalized): Secondary | ICD-10-CM

## 2020-07-10 DIAGNOSIS — M542 Cervicalgia: Secondary | ICD-10-CM | POA: Diagnosis not present

## 2020-07-10 DIAGNOSIS — M62838 Other muscle spasm: Secondary | ICD-10-CM

## 2020-07-10 NOTE — Therapy (Signed)
[facility] [address]  [place], [place], [address] Phone: [phone]   Fax:  [phone]  Physical Therapy Treatment  Patient Details  Name: Terri Waters MRN: [id number] Date of Birth: 11-30-1955 Referring Provider (PT): [name], MD   Encounter Date: 07/10/2020   PT End of Session - 07/10/20 1442     Visit Number 2    Number of Visits 13    Date for PT Re-Evaluation 08/14/20    Authorization Type UHC    PT Start Time 0354    PT Stop Time 6568    PT Time Calculation (min) 47 min    Activity Tolerance Patient tolerated treatment well;Patient limited by pain    Behavior During Therapy [facility] for tasks assessed/performed             Past Medical History:  Diagnosis Date   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   Diabetes mellitus without complication ([place])    High cholesterol    Hypertension    Hypothyroidism    Migraines    Stroke [facility])    Thyroid disease    Ventricular tachycardia ([place])     Past Surgical History:  Procedure Laterality Date   ABDOMINAL HYSTERECTOMY     BREAST SURGERY     lumps removed - over 20 years ago   THYROIDECTOMY     TOTAL KNEE ARTHROPLASTY Right 12/18/2017   Procedure: RIGHT TOTAL KNEE ARTHROPLASTY;  Surgeon: Netta Cedars, MD;  Location: [place];  Service: Orthopedics;  Laterality: Right;    There were no vitals filed for this visit.   Subjective Assessment - 07/10/20 1356     Subjective Doing okay. L arm still hurts at night and noticing some pain in the R arm as well. MRI is scheduled for Thursday. Denies questions on HEP.    Pertinent History stroke, migraines, hypothyroidism, HTN, HLD, DM, hx breast CA, R TKA    Diagnostic tests scheduled for MRI next week    Patient Stated Goals decrease pain    Currently in Pain? Yes    Pain Score 3     Pain Location Neck    Pain Orientation Left    Pain Descriptors / Indicators Aching;Tingling    Pain Type Chronic pain     Pain Radiating Towards down L arm to fingers                [facility] PT Assessment - 07/10/20 1401       Observation/Other Assessments   Focus on Therapeutic Outcomes (FOTO)  cervical: 39                           [facility] Adult PT Treatment/Exercise - 07/10/20 0001       Exercises   Exercises Neck;Shoulder      Neck Exercises: Machines for Strengthening   UBE (Upper Arm Bike) L2.0 3 min forward/3 min back      Neck Exercises: Seated   Neck Retraction 3 secs;15 reps    Neck Retraction Limitations last 5 reps with yellow TB   requiring cues to avoid excessive stretch on TB     Shoulder Exercises: Seated   Other Seated Exercises scapular retraction 10x3"      Shoulder Exercises: Standing   Row Strengthening;Both;10 reps;Theraband    Row Limitations 2x10; cues to stop at neutral      Manual Therapy   Manual Therapy Soft tissue  mobilization;Myofascial release    Manual therapy comments sitting    Soft tissue mobilization STM to L UT, LS, scalenes, cervical paraspinals, B suboccipitals    Myofascial Release manual TPR to L UT, LS, scalenes, B suboccipitals   particularly tight and TTP in L UT     Neck Exercises: Stretches   Upper Trapezius Stretch Right;Left;1 rep    Upper Trapezius Stretch Limitations educated on small tolerable ROM for longer duration    Levator Stretch Right;Left;1 rep;30 seconds    Levator Stretch Limitations to tolerance    Corner Stretch 2 reps;30 seconds    Corner Stretch Limitations report of L UE N/T which resolved upon stopping stretch                    PT Education - 07/10/20 1441     Education Details edu on use of heat 10-15 minutes for muscle relaxation    Person(s) Educated Patient    Methods Explanation    Comprehension Verbalized understanding              PT Short Term Goals - 07/10/20 1444       PT SHORT TERM GOAL #1   Title Independent with initial HEP    Time 3    Period Weeks    Status  On-going    Target Date 07/24/20               PT Long Term Goals - 07/10/20 1444       PT LONG TERM GOAL #1   Title Independent with ongoing/advanced HEP    Time 6    Period Weeks    Status On-going      PT LONG TERM GOAL #2   Title Patient to demonstrate B UE strength >/=4+/5 and grip strength atleast 20lbs.    Time 6    Period Weeks    Status On-going      PT LONG TERM GOAL #3   Title Patient to demonstrate cervical AROM WFL and without pain limiting.    Time 6    Period Weeks    Status On-going      PT LONG TERM GOAL #4   Title Patient to report 70% improvement in night pain.    Time 6    Period Weeks    Status On-going                   Plan - 07/10/20 1443     Clinical Impression Statement Patient reports continued L UE pain as well as slightly increased R UE pain. Notes that she is scheduled for a cervical MRI this week. Denies questions on HEP. Worked on review of HEP for max understanding and carryover. Patient demonstrated fairly good form with these exercises, however required correction of form when resistance provided with cervical retractions. Patient was educated on small tolerable ROM with longer duration during cervical stretching for max benefit. Noted more discomfort with L sided cervical stretch. Patient with brief report of L UE N/T which resolved upon stopping pec stretch. Tolerated STM and manual TPR to L sided cervical musculature with particular tightness and TTP in L UT. Patient reported no complaints at end of session.    Comorbidities stroke, migraines, hypothyroidism, HTN, HLD, DM, hx breast CA, R TKA    PT Treatment/Interventions ADLs/Self Care Home Management;Cryotherapy;Electrical Stimulation;Moist Heat;Traction;Therapeutic exercise;Therapeutic activities;Functional mobility training;Ultrasound;Neuromuscular re-education;Patient/family education;Manual techniques;Taping;Energy conservation;Dry needling;Passive range of motion    PT  Next Visit Plan progress  cervical stretching and postural strengthening; STM    Consulted and Agree with Plan of Care Patient             Patient will benefit from skilled therapeutic intervention in order to improve the following deficits and impairments:  Increased edema, Decreased activity tolerance, Decreased strength, Increased fascial restricitons, Impaired UE functional use, Pain, Increased muscle spasms, Improper body mechanics, Decreased range of motion, Impaired flexibility, Postural dysfunction  Visit Diagnosis: Cervicalgia  Muscle weakness (generalized)  Other muscle spasm     Problem List Patient Active Problem List   Diagnosis Date Noted   Cerebellar infarct ([place]) 06/23/2019   Status post total knee replacement, 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

## 2020-07-18 ENCOUNTER — Other Ambulatory Visit: Payer: Self-pay

## 2020-07-18 ENCOUNTER — Ambulatory Visit: Payer: 59 | Attending: Family Medicine

## 2020-07-18 DIAGNOSIS — R2681 Unsteadiness on feet: Secondary | ICD-10-CM | POA: Insufficient documentation

## 2020-07-18 DIAGNOSIS — M6281 Muscle weakness (generalized): Secondary | ICD-10-CM | POA: Diagnosis present

## 2020-07-18 DIAGNOSIS — M62838 Other muscle spasm: Secondary | ICD-10-CM | POA: Diagnosis present

## 2020-07-18 DIAGNOSIS — R42 Dizziness and giddiness: Secondary | ICD-10-CM | POA: Diagnosis present

## 2020-07-18 DIAGNOSIS — M542 Cervicalgia: Secondary | ICD-10-CM | POA: Diagnosis not present

## 2020-07-18 NOTE — Therapy (Addendum)
[facility] [address]  [place], [place], [address] Phone: [phone]   Fax:  [phone]  Physical Therapy Treatment  Patient Details  Name: Terri Waters MRN: [id number] Date of Birth: 1955/06/13 Referring Provider (PT): [name], MD   Encounter Date: 07/18/2020   PT End of Session - 07/18/20 1441     Visit Number 3    Number of Visits 13    Date for PT Re-Evaluation 08/14/20    Authorization Type UHC    PT Start Time 5400    PT Stop Time 1441    PT Time Calculation (min) 43 min    Activity Tolerance Patient tolerated treatment well;Patient limited by pain    Behavior During Therapy [facility] for tasks assessed/performed             Past Medical History:  Diagnosis Date   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   Diabetes mellitus without complication ([place])    High cholesterol    Hypertension    Hypothyroidism    Migraines    Stroke [facility])    Thyroid disease    Ventricular tachycardia ([place])     Past Surgical History:  Procedure Laterality Date   ABDOMINAL HYSTERECTOMY     BREAST SURGERY     lumps removed - over 20 years ago   THYROIDECTOMY     TOTAL KNEE ARTHROPLASTY Right 12/18/2017   Procedure: RIGHT TOTAL KNEE ARTHROPLASTY;  Surgeon: Netta Cedars, MD;  Location: [place];  Service: Orthopedics;  Laterality: Right;    There were no vitals filed for this visit.   Subjective Assessment - 07/18/20 1359     Subjective Cleaned the carpet throughout her whole house Friday and Saturday. Also did some mopping. Was hurting Sunday night into Monday with numbness on palmar surface of the thumb.    Pertinent History stroke, migraines, hypothyroidism, HTN, HLD, DM, hx breast CA, R TKA    Diagnostic tests scheduled for MRI next week    Patient Stated Goals decrease pain    Currently in Pain? Yes    Pain Score 4     Pain Location Neck    Pain Orientation Left;Mid    Pain Descriptors / Indicators  Aching;Tingling    Pain Type Chronic pain                               [facility] Adult PT Treatment/Exercise - 07/18/20 0001       Neck Exercises: Machines for Strengthening   UBE (Upper Arm Bike) L2.0 3 min forward/3 min back      Shoulder Exercises: Seated   External Rotation Strengthening;Both;10 reps;Theraband    Theraband Level (Shoulder External Rotation) Level 1 (Yellow)      Shoulder Exercises: Stretch   Other Shoulder Stretches L pec stretch at elevated mat table 2x20 sec      Modalities   Modalities Moist Heat      Moist Heat Therapy   Number Minutes Moist Heat 10 Minutes    Moist Heat Location Cervical;Shoulder      Manual Therapy   Manual Therapy Soft tissue mobilization;Myofascial release    Manual therapy comments sitting    Soft tissue mobilization STM to L UT, LS, scalenes, cervical paraspinals    Myofascial Release manual TPR to L UT, LS, scalenes      Neck Exercises: Stretches   Upper Trapezius Stretch Right;Left;2 reps;30  seconds    Upper Trapezius Stretch Limitations seated    Levator Stretch Right;Left;30 seconds                      PT Short Term Goals - 07/10/20 1444       PT SHORT TERM GOAL #1   Title Independent with initial HEP    Time 3    Period Weeks    Status On-going    Target Date 07/24/20               PT Long Term Goals - 07/10/20 1444       PT LONG TERM GOAL #1   Title Independent with ongoing/advanced HEP    Time 6    Period Weeks    Status On-going      PT LONG TERM GOAL #2   Title Patient to demonstrate B UE strength >/=4+/5 and grip strength atleast 20lbs.    Time 6    Period Weeks    Status On-going      PT LONG TERM GOAL #3   Title Patient to demonstrate cervical AROM WFL and without pain limiting.    Time 6    Period Weeks    Status On-going      PT LONG TERM GOAL #4   Title Patient to report 70% improvement in night pain.    Time 6    Period Weeks    Status On-going                    Plan - 07/18/20 1518     Clinical Impression Statement Pt reported pain during STM in LS and cervical paraspinals. She was able to tolerate STM but still reported pain in posterior arm and hand. She demonstrated low tolerance for ther ex today due to pain. She did a lot of cleaning over the weekend which may have contributed to her pain today. She well tolerated the neck stretches today but reported numbeness with the pec stretch. Duration was reduce d/t this. Advised her to avoid any strenuous activities to help the healing process. Ended session with moist heat for pain and to relieve muscle tension.    Personal Factors and Comorbidities Age;Comorbidity 3+;Past/Current Experience;Profession;Time since onset of injury/illness/exacerbation    Comorbidities stroke, migraines, hypothyroidism, HTN, HLD, DM, hx breast CA, R TKA    PT Frequency 2x / week    PT Duration 6 weeks    PT Treatment/Interventions ADLs/Self Care Home Management;Cryotherapy;Electrical Stimulation;Moist Heat;Traction;Therapeutic exercise;Therapeutic activities;Functional mobility training;Ultrasound;Neuromuscular re-education;Patient/family education;Manual techniques;Taping;Energy conservation;Dry needling;Passive range of motion    PT Next Visit Plan progress cervical stretching and postural strengthening; STM    Consulted and Agree with Plan of Care Patient             Patient will benefit from skilled therapeutic intervention in order to improve the following deficits and impairments:  Increased edema, Decreased activity tolerance, Decreased strength, Increased fascial restricitons, Impaired UE functional use, Pain, Increased muscle spasms, Improper body mechanics, Decreased range of motion, Impaired flexibility, Postural dysfunction  Visit Diagnosis: Cervicalgia  Muscle weakness (generalized)  Other muscle spasm  Dizziness and giddiness  Unsteadiness on feet     Problem  List Patient Active Problem List   Diagnosis Date Noted   Cerebellar infarct ([place]) 06/23/2019   Status post total knee replacement, 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  [place], [place], [address] Phone: [phone]   Fax:  [phone]  Name: Terri Waters MRN: [id number] Date of Birth: 03-13-1955   PHYSICAL THERAPY DISCHARGE SUMMARY  Visits from Start of Care: 3  Current functional level related to goals / functional outcomes: Unable to assess; patient did not return d/t MD putting her on hold from therapy    Remaining deficits: Unable to assess   Education / Equipment: HEP  Plan: Patient agrees to discharge.  Patient goals were not met. Patient is being discharged due to not returning.     Janene Harvey, PT, DPT 08/20/20 12:51 PM

## 2020-07-30 ENCOUNTER — Encounter: Payer: 59 | Admitting: Physical Therapy

## 2020-08-02 ENCOUNTER — Encounter: Payer: 59 | Admitting: Physical Therapy

## 2020-08-06 ENCOUNTER — Encounter: Payer: 59 | Admitting: Physical Therapy

## 2020-08-09 ENCOUNTER — Encounter: Payer: 59 | Admitting: Physical Therapy

## 2020-09-20 ENCOUNTER — Ambulatory Visit: Payer: 59 | Attending: Orthopedic Surgery | Admitting: Physical Therapy

## 2020-09-20 ENCOUNTER — Encounter: Payer: Self-pay | Admitting: Physical Therapy

## 2020-09-20 ENCOUNTER — Other Ambulatory Visit: Payer: Self-pay

## 2020-09-20 DIAGNOSIS — R42 Dizziness and giddiness: Secondary | ICD-10-CM | POA: Insufficient documentation

## 2020-09-20 DIAGNOSIS — M5412 Radiculopathy, cervical region: Secondary | ICD-10-CM

## 2020-09-20 DIAGNOSIS — M6281 Muscle weakness (generalized): Secondary | ICD-10-CM

## 2020-09-20 DIAGNOSIS — M542 Cervicalgia: Secondary | ICD-10-CM

## 2020-09-20 DIAGNOSIS — R293 Abnormal posture: Secondary | ICD-10-CM

## 2020-09-20 DIAGNOSIS — M62838 Other muscle spasm: Secondary | ICD-10-CM

## 2020-09-20 NOTE — Patient Instructions (Signed)
     Access Code: [email or web address] URL: [email or web address] Date: 09/20/2020 Prepared by: Annie Paras  Exercises Seated Gentle Upper Trapezius Stretch - 2-3 x daily - 7 x weekly - 3 reps - 30 sec hold Gentle Levator Scapulae Stretch - 2-3 x daily - 7 x weekly - 3 reps - 30 sec hold Doorway Pec Stretch at 60 Degrees Abduction with Arm Straight - 2-3 x daily - 7 x weekly - 3 reps - 30 sec hold Doorway Pec Stretch at 90 Elevation with Arm Straight - 2-3 x daily - 7 x weekly - 3 reps - 30 sec hold Seated Cervical Retraction - 3 x daily - 7 x weekly - 2 sets - 10 reps - 3-5 sec hold Seated Scapular Retraction - 3 x daily - 7 x weekly - 2 sets - 10 reps - 5 sec hold

## 2020-09-20 NOTE — Therapy (Signed)
[facility] [address]  [place], [place], [address] Phone: [phone]   Fax:  [phone]  Physical Therapy Evaluation  Patient Details  Name: Terri Waters MRN: [id number] Date of Birth: 09/17/1955 Referring Provider (PT): Melina Schools, MD   Encounter Date: 09/20/2020   PT End of Session - 09/20/20 1356     Visit Number 1    Number of Visits 16    Date for PT Re-Evaluation 11/15/20    Authorization Type UHC    PT Start Time [id number]    PT Stop Time 1448    PT Time Calculation (min) 52 min    Activity Tolerance Patient tolerated treatment well    Behavior During Therapy [facility] for tasks assessed/performed             Past Medical History:  Diagnosis Date   Cancer ([place])    papillary carcinoma   Diabetes mellitus without complication ([place])    High cholesterol    Hypertension    Hypothyroidism    Migraines    Stroke [facility])    Thyroid disease    Ventricular tachycardia ([place])     Past Surgical History:  Procedure Laterality Date   ABDOMINAL HYSTERECTOMY     BREAST SURGERY     lumps removed - over 20 years ago   THYROIDECTOMY     TOTAL KNEE ARTHROPLASTY Right 12/18/2017   Procedure: RIGHT TOTAL KNEE ARTHROPLASTY;  Surgeon: Netta Cedars, MD;  Location: [facility];  Service: Orthopedics;  Laterality: Right;    There were no vitals filed for this visit.    Subjective Assessment - 09/20/20 1406     Subjective Pt reports she started having pain in her neck with radicular pain in to her arms with numbness in fingers and hands. She reports pain has improved since MD suggested possible surgical intervention last week - she does not want surgery. Had an injection in July with some relief and MD considering a 2nd injection. Pain and radicular symptoms most common at night.    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 Yes    Pain Score 3     Pain Location Neck    Pain Orientation Lower;Upper    Pain Descriptors / Indicators Dull;Aching   "knot" base of skull on R   Pain Type Acute pain    Pain Radiating Towards into L scapula today; radicular pain has previously extended to L elbow; numbness & tingling in L>R hands    Pain Onset More than a month ago   April/May off & on; got bad as of June 2022   Pain Frequency Intermittent    Aggravating Factors  laying on side in bed L>R; prolonged time on computer    Pain Relieving Factors heat, Tylenol    Effect of Pain on Daily Activities pain interferes with her job; migraines have been more prominent; difficulty turning head esp when driving                [facility] PT Assessment - 09/20/20 1356       Assessment   Medical Diagnosis Cervicalgia    Referring Provider (PT) Melina Schools, MD    Onset Date/Surgical  Date --   April/May 2022   Hand Dominance Right    Next MD Visit [facility] - 10/12/20; f/u with MD TBD following injection    Prior Therapy yes      Precautions   Precautions None      Restrictions   Weight Bearing Restrictions No      Balance Screen   Has the patient fallen in the past 6 months No    Has the patient had a decrease in activity level because of a fear of falling?  No    Is the patient reluctant to leave their home because of a fear of falling?  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    Leisure read, no regular  exercise but just joined [employer] to start on Monday      Cognition   Overall Cognitive Status Within Functional Limits for tasks assessed      Observation/Other Assessments   Focus on Therapeutic Outcomes (FOTO)  Neck = 53; predicted D/C FS = 62      Sensation   Light Touch Appears Intact   c/o N/T in all fingers and thumb     Coordination   Gross Motor Movements are Fluid and Coordinated Yes      Posture/Postural Control   Posture/Postural Control Postural limitations    Postural Limitations Rounded Shoulders    Posture Comments B elevated shoulders      AROM   Overall AROM Comments mildly limited functional ROM of B shoulders, esp in FIR    AROM Assessment Site Cervical;Shoulder    Cervical Flexion 54    Cervical Extension 42    Cervical - Right Side Bend 25    Cervical - Left Side Bend 15   pain   Cervical - Right Rotation 45   tight   Cervical - Left Rotation 26   tight & pain     Strength   Right Shoulder Flexion 4/5    Right Shoulder ABduction 4/5    Right Shoulder Internal Rotation 4+/5    Right Shoulder External Rotation 4-/5    Left Shoulder Flexion 4-/5    Left Shoulder ABduction 4-/5    Left Shoulder Internal Rotation 4/5    Left Shoulder External Rotation 4-/5    Right Hand Grip (lbs) 32.67   27, 35, 36   Left Hand Grip (lbs) 14   17, 14, 11     Palpation   Palpation comment TTP in L UT and LS, pec, and cervical paraspinals; slightly TTP in R pec; increased soft tissue restriction evident L>R                        Objective measurements completed on examination: See above findings.                PT Education - 09/20/20 1445     Education Details PT eval findings, anticipated POC & initial HEP - Access Code: [email or web address]    Person(s) Educated Patient  Methods Explanation;Demonstration;Verbal cues;Handout    Comprehension Verbalized understanding;Verbal cues required;Returned demonstration;Need further instruction               PT Short Term Goals - 09/20/20 1448       PT SHORT TERM GOAL #1   Title Patient will be independent with initial HEP    Status New    Target Date 10/18/20               PT Long Term Goals - 09/20/20 1838       PT LONG TERM GOAL #1   Title Patient will be independent with ongoing/advanced HEP for self-management at home    Status New    Target Date 11/15/20      PT LONG TERM GOAL #2   Title Improve posture and alignment with patient to demonstrate improved upright posture with posterior shoulder girdle engaged    Status New    Target Date 11/15/20      PT LONG TERM GOAL #3   Title Patient to demonstrate cervical AROM WFL and without pain limiting    Status New    Target Date 11/15/20      PT LONG TERM GOAL #4   Title Patient to report >/= 50-70% improvement in night pain to reduce sleep disturbance    Status New    Target Date 11/15/20      PT LONG TERM GOAL #5   Title Patient to demonstrate B UE strength >/= 4+/5 and grip strength at least 20lbs    Status New    Target Date 11/15/20                    Plan - 09/20/20 1830     Clinical Impression Statement Alyrica is a 65 y/o female who presents to OP PT with acute insidious neck pain originating intermittently in April/May and worsening as of June 2022. Neck pain intermittently associated with radicular pain into scapular region (L>R) along with numbness and tingling in B hands and increased incidence of headaches. Pain worse in PM, especially in R sidelying. Denies issues with bowel control or imbalance. Pt reports MRI revealed multilevel DDD and bone spurs, worst at C6-7. Current deficits include postural abnormalities including rounded and elevated shoulders, limited and painful cervical AROM, L>R UE and grip weakness, TTP in L>R UT and LS, pec, and cervical paraspinals, and increased soft tissue restriction evident L>R. Madelis had previously initiated a PT episode in June for the same issues but  was put on hold by MD after only 3 visits. She reports limited follow through with HEP from that episode, therefore reviewed prior HEP exercises and added stretching for upper shoulders and pecs. Cherish will benefit from skilled PT to address above deficits to restore pain free functional cervical ROM and alleviate B UE radiculopathy to improve tolerance for normal daily and work activities.    Personal Factors and Comorbidities Age;Comorbidity 3+;Past/Current Experience;Profession;Time since onset of injury/illness/exacerbation    Comorbidities stroke, migraines, hypothyroidism, HTN, HLD, DM, hx breast CA, R TKA    Examination-Activity Limitations Bed Mobility;Sleep;Carry;Dressing;Hygiene/Grooming;Lift;Reach Overhead    Examination-Participation Restrictions Occupation;Meal Prep;Laundry;Driving;Community Activity;Shop;Cleaning    Stability/Clinical Decision Making Evolving/Moderate complexity    Clinical Decision Making Moderate    Rehab Potential Good    PT Frequency 2x / week    PT Duration 8 weeks   6-8 weeks   PT Treatment/Interventions ADLs/Self Care Home Management;Cryotherapy;Electrical Stimulation;Iontophoresis '4mg'$ /ml Dexamethasone;Moist Heat;Traction;Ultrasound;Functional mobility training;Therapeutic activities;Therapeutic exercise;Neuromuscular re-education;Patient/family education;Manual techniques;Passive  range of motion;Dry needling;Energy conservation;Taping;Spinal Manipulations    PT Next Visit Plan Review initial HEP; cervical and postural stretching; gentle cervical ROM; postural strengthening, manual therapy as indicated    PT Home Exercise Plan Access Code: [email or web address] (9/8)    Consulted and Agree with Plan of Care Patient             Patient will benefit from skilled therapeutic intervention in order to improve the following deficits and impairments:  Decreased activity tolerance, Decreased knowledge of precautions, Decreased range of motion, Decreased strength, Increased  edema, Increased fascial restricitons, Increased muscle spasms, Impaired perceived functional ability, Impaired flexibility, Impaired UE functional use, Improper body mechanics, Postural dysfunction, Pain  Visit Diagnosis: Cervicalgia  Radiculopathy, cervical region  Muscle weakness (generalized)  Other muscle spasm  Abnormal posture     Problem List Patient Active Problem List   Diagnosis Date Noted   Cerebellar infarct ([place]) 06/23/2019   Status post total knee replacement, 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  [place], [place], [address] Phone: [phone]   Fax:  [phone]  Name: MEEYAH LOBLEY MRN: [id number] Date of Birth: 1955-04-07

## 2020-09-24 ENCOUNTER — Other Ambulatory Visit: Payer: Self-pay

## 2020-09-24 ENCOUNTER — Emergency Department (HOSPITAL_BASED_OUTPATIENT_CLINIC_OR_DEPARTMENT_OTHER): Payer: 59

## 2020-09-24 ENCOUNTER — Encounter (HOSPITAL_BASED_OUTPATIENT_CLINIC_OR_DEPARTMENT_OTHER): Payer: Self-pay

## 2020-09-24 ENCOUNTER — Observation Stay (HOSPITAL_BASED_OUTPATIENT_CLINIC_OR_DEPARTMENT_OTHER)
Admission: EM | Admit: 2020-09-24 | Discharge: 2020-09-25 | Disposition: A | Discharge status: Home / Self Care | Payer: 59 | Attending: Internal Medicine | Admitting: Internal Medicine

## 2020-09-24 DIAGNOSIS — Z7984 Long term (current) use of oral hypoglycemic drugs: Secondary | ICD-10-CM | POA: Insufficient documentation

## 2020-09-24 DIAGNOSIS — Z85858 Personal history of malignant neoplasm of other endocrine glands: Secondary | ICD-10-CM | POA: Insufficient documentation

## 2020-09-24 DIAGNOSIS — E039 Hypothyroidism, unspecified: Secondary | ICD-10-CM | POA: Diagnosis not present

## 2020-09-24 DIAGNOSIS — R4182 Altered mental status, unspecified: Secondary | ICD-10-CM | POA: Diagnosis not present

## 2020-09-24 DIAGNOSIS — Z8673 Personal history of transient ischemic attack (TIA), and cerebral infarction without residual deficits: Secondary | ICD-10-CM | POA: Insufficient documentation

## 2020-09-24 DIAGNOSIS — Z79899 Other long term (current) drug therapy: Secondary | ICD-10-CM | POA: Diagnosis not present

## 2020-09-24 DIAGNOSIS — Z96651 Presence of right artificial knee joint: Secondary | ICD-10-CM | POA: Insufficient documentation

## 2020-09-24 DIAGNOSIS — Z20822 Contact with and (suspected) exposure to covid-19: Secondary | ICD-10-CM | POA: Diagnosis not present

## 2020-09-24 DIAGNOSIS — Y900 Blood alcohol level of less than 20 mg/100 ml: Secondary | ICD-10-CM | POA: Insufficient documentation

## 2020-09-24 DIAGNOSIS — Z7902 Long term (current) use of antithrombotics/antiplatelets: Secondary | ICD-10-CM | POA: Diagnosis not present

## 2020-09-24 DIAGNOSIS — R531 Weakness: Secondary | ICD-10-CM | POA: Diagnosis not present

## 2020-09-24 DIAGNOSIS — R42 Dizziness and giddiness: Principal | ICD-10-CM

## 2020-09-24 DIAGNOSIS — I10 Essential (primary) hypertension: Secondary | ICD-10-CM | POA: Insufficient documentation

## 2020-09-24 DIAGNOSIS — E119 Type 2 diabetes mellitus without complications: Secondary | ICD-10-CM | POA: Insufficient documentation

## 2020-09-24 LAB — COMPREHENSIVE METABOLIC PANEL
ALT: 12 U/L (ref 0–44)
AST: 15 U/L (ref 15–41)
Albumin: 3.6 g/dL (ref 3.5–5.0)
Alkaline Phosphatase: 107 U/L (ref 38–126)
Anion gap: 8 (ref 5–15)
BUN: 14 mg/dL (ref 8–23)
CO2: 28 mmol/L (ref 22–32)
Calcium: 8.9 mg/dL (ref 8.9–10.3)
Chloride: 103 mmol/L (ref 98–111)
Creatinine, Ser: 0.96 mg/dL (ref 0.44–1.00)
GFR, Estimated: >60 mL/min (ref >60)
Glucose, Bld: 112 mg/dL — ABNORMAL HIGH (ref 70–99)
Potassium: 3.5 mmol/L (ref 3.5–5.1)
Sodium: 139 mmol/L (ref 135–145)
Total Bilirubin: 0.3 mg/dL (ref 0.3–1.2)
Total Protein: 7 g/dL (ref 6.5–8.1)

## 2020-09-24 LAB — DIFFERENTIAL
Abs Immature Granulocytes: 0.02 10*3/uL (ref 0.00–0.07)
Basophils Absolute: 0 10*3/uL (ref 0.0–0.1)
Basophils Relative: 0 %
Eosinophils Absolute: 0.1 10*3/uL (ref 0.0–0.5)
Eosinophils Relative: 1 %
Immature Granulocytes: 0 %
Lymphocytes Relative: 38 %
Lymphs Abs: 3 10*3/uL (ref 0.7–4.0)
Monocytes Absolute: 0.7 10*3/uL (ref 0.1–1.0)
Monocytes Relative: 9 %
Neutro Abs: 4.2 10*3/uL (ref 1.7–7.7)
Neutrophils Relative %: 52 %

## 2020-09-24 LAB — URINALYSIS, ROUTINE W REFLEX MICROSCOPIC
Bilirubin Urine: NEGATIVE
Glucose, UA: NEGATIVE mg/dL
Hgb urine dipstick: NEGATIVE
Ketones, ur: NEGATIVE mg/dL
Leukocytes,Ua: NEGATIVE
Nitrite: NEGATIVE
Protein, ur: NEGATIVE mg/dL
Specific Gravity, Urine: 1.005 (ref 1.005–1.030)
pH: 6 (ref 5.0–8.0)

## 2020-09-24 LAB — RAPID URINE DRUG SCREEN, HOSP PERFORMED
Amphetamines: NOT DETECTED
Barbiturates: NOT DETECTED
Benzodiazepines: NOT DETECTED
Cocaine: NOT DETECTED
Opiates: NOT DETECTED
Tetrahydrocannabinol: NOT DETECTED

## 2020-09-24 LAB — CBC
HCT: 35.3 % — ABNORMAL LOW (ref 36.0–46.0)
HCT: 34 % — ABNORMAL LOW (ref 36.0–46.0)
Hemoglobin: 11.6 g/dL — ABNORMAL LOW (ref 12.0–15.0)
Hemoglobin: 11.1 g/dL — ABNORMAL LOW (ref 12.0–15.0)
MCH: 28.6 pg (ref 26.0–34.0)
MCH: 28 pg (ref 26.0–34.0)
MCHC: 32.9 g/dL (ref 30.0–36.0)
MCHC: 32.6 g/dL (ref 30.0–36.0)
MCV: 86.9 fL (ref 80.0–100.0)
MCV: 85.9 fL (ref 80.0–100.0)
Platelets: 296 10*3/uL (ref 150–400)
Platelets: 289 10*3/uL (ref 150–400)
RBC: 4.06 MIL/uL (ref 3.87–5.11)
RBC: 3.96 MIL/uL (ref 3.87–5.11)
RDW: 14.7 % (ref 11.5–15.5)
RDW: 14.5 % (ref 11.5–15.5)
WBC: 8.2 10*3/uL (ref 4.0–10.5)
WBC: 6.5 10*3/uL (ref 4.0–10.5)
nRBC: 0 % (ref 0.0–0.2)
nRBC: 0 % (ref 0.0–0.2)

## 2020-09-24 LAB — CBG MONITORING, ED
Glucose-Capillary: 93 mg/dL (ref 70–99)
Glucose-Capillary: 88 mg/dL (ref 70–99)

## 2020-09-24 LAB — CREATININE, SERUM
Creatinine, Ser: 1.09 mg/dL — ABNORMAL HIGH (ref 0.44–1.00)
GFR, Estimated: 57 mL/min — ABNORMAL LOW (ref >60)

## 2020-09-24 LAB — PROTIME-INR
INR: 1 (ref 0.8–1.2)
Prothrombin Time: 13.5 seconds (ref 11.4–15.2)

## 2020-09-24 LAB — ETHANOL: Alcohol, Ethyl (B): <10 mg/dL (ref <10)

## 2020-09-24 LAB — RESP PANEL BY RT-PCR (FLU A&B, COVID) ARPGX2
Influenza A by PCR: NEGATIVE
Influenza B by PCR: NEGATIVE
SARS Coronavirus 2 by RT PCR: NEGATIVE

## 2020-09-24 LAB — LIPASE, BLOOD: Lipase: 35 U/L (ref 11–51)

## 2020-09-24 LAB — APTT: aPTT: 27 seconds (ref 24–36)

## 2020-09-24 LAB — TROPONIN I (HIGH SENSITIVITY): Troponin I (High Sensitivity): 4 ng/L (ref <18)

## 2020-09-24 IMAGING — CT CT ANGIO HEAD
1 of 8 series · 2 of 33 positions shown · IV contrast (Omnipaque)
Comparison: Plain head CT [5U] hours today.
COMPARISON: Plain head CT [5U] hours today.

Addendum:
CLINICAL DATA: 65-year-old female code stroke presentation.

EXAM:
CT ANGIOGRAPHY HEAD AND NECK
TECHNIQUE: Multidetector CT imaging of the head and neck was performed using
the standard protocol during bolus administration of intravenous
contrast. Multiplanar CT image reconstructions and MIPs were
obtained to evaluate the vascular anatomy. Carotid stenosis
measurements (when applicable) are obtained utilizing NASCET
criteria, using the distal internal carotid diameter as the
denominator.
CONTRAST:  100mL OMNIPAQUE IOHEXOL 350 MG/ML SOLN

[Series 507: axial thin · axial · 0.39mm/px · z∈[-252,-150]mm · 2 of 308 slices shown]
[im 103/308  soft-tissue]
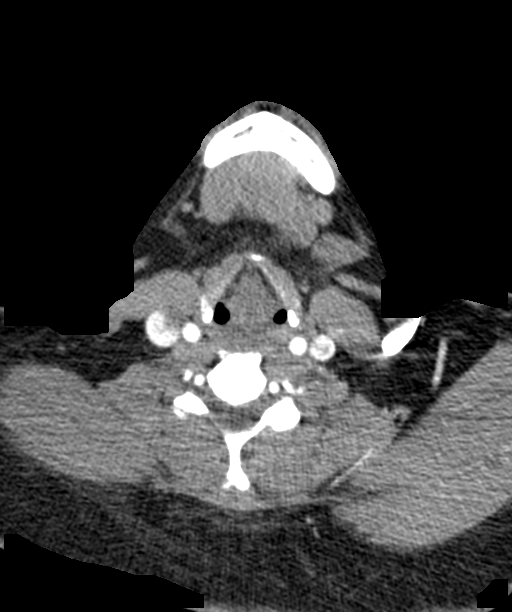
[im 205/308  bone]
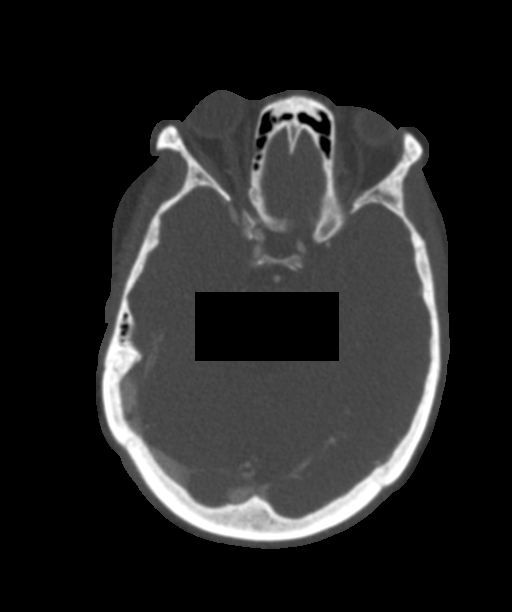

[2 of 33 positions shown; findings below may reference images not displayed]

FINDINGS: CTA NECK

Skeleton: No acute osseous abnormality identified. Cervical spine
disc and endplate degeneration.

Upper chest: Negative.

Other neck: Diminutive or absent thyroid. Effaced oropharynx appears
physiologic. Negative other visible neck soft tissues.

Aortic arch: 3 vessel arch configuration. Minimal arch
atherosclerosis.

Right carotid system: Negative.

Left carotid system: Negative.

Vertebral arteries:
Proximal right subclavian artery and right vertebral artery origin
are normal. Right vertebral is patent and normal to the skull base.

Mild plaque at the proximal left subclavian artery without stenosis.
Normal left vertebral artery origin. Tortuous left vertebral artery
is patent to the skull base without stenosis.

CTA HEAD

Posterior circulation: Mildly dominant right V4 segment. Patent
distal vertebral arteries to the basilar without plaque or stenosis.
Both AICAs appear dominant and patent. Patent basilar artery without
stenosis. Patent SCA and PCA origins. Posterior communicating
arteries are diminutive or absent. Bilateral PCA branches are within
normal limits.

Anterior circulation: Both ICA siphons are patent. There is mild
calcified siphon plaque and tortuosity bilaterally without stenosis.
Patent carotid termini. Patent MCA and ACA origins. Diminutive or
absent anterior communicating artery. Bilateral ACA branches are
within normal limits. Left MCA M1 segment and bifurcation are patent
without stenosis. Right MCA M1 segment and trifurcation are patent
without stenosis. Bilateral MCA branches are within normal limits.

Venous sinuses: Patent and within normal limits. Right
transverse/sigmoid sinus junction arachnoid granulation (normal).

Anatomic variants: Mildly dominant right vertebral artery.

Review of the MIP images confirms the above findings
IMPRESSION: 1. Largely negative CTA Head and Neck.
No large vessel occlusion. No atherosclerosis in the neck. Mild ICA
siphon atherosclerosis without stenosis.

2. Diminutive or absent thyroid.

ADDENDUM:
Study discussed by telephone with Dr. AQUINO on [DATE] at [5U]
hours.

*** End of Addendum ***
FINDINGS: CTA NECK

Skeleton: No acute osseous abnormality identified. Cervical spine
disc and endplate degeneration.

Upper chest: Negative.

Other neck: Diminutive or absent thyroid. Effaced oropharynx appears
physiologic. Negative other visible neck soft tissues.

Aortic arch: 3 vessel arch configuration. Minimal arch
atherosclerosis.

Right carotid system: Negative.

Left carotid system: Negative.

Vertebral arteries:
Proximal right subclavian artery and right vertebral artery origin
are normal. Right vertebral is patent and normal to the skull base.

Mild plaque at the proximal left subclavian artery without stenosis.
Normal left vertebral artery origin. Tortuous left vertebral artery
is patent to the skull base without stenosis.

CTA HEAD

Posterior circulation: Mildly dominant right V4 segment. Patent
distal vertebral arteries to the basilar without plaque or stenosis.
Both AICAs appear dominant and patent. Patent basilar artery without
stenosis. Patent SCA and PCA origins. Posterior communicating
arteries are diminutive or absent. Bilateral PCA branches are within
normal limits.

Anterior circulation: Both ICA siphons are patent. There is mild
calcified siphon plaque and tortuosity bilaterally without stenosis.
Patent carotid termini. Patent MCA and ACA origins. Diminutive or
absent anterior communicating artery. Bilateral ACA branches are
within normal limits. Left MCA M1 segment and bifurcation are patent
without stenosis. Right MCA M1 segment and trifurcation are patent
without stenosis. Bilateral MCA branches are within normal limits.

Venous sinuses: Patent and within normal limits. Right
transverse/sigmoid sinus junction arachnoid granulation (normal).

Anatomic variants: Mildly dominant right vertebral artery.

Review of the MIP images confirms the above findings
IMPRESSION: 1. Largely negative CTA Head and Neck.
No large vessel occlusion. No atherosclerosis in the neck. Mild ICA
siphon atherosclerosis without stenosis.

2. Diminutive or absent thyroid.

## 2020-09-24 IMAGING — CT CT HEAD CODE STROKE
3 series · 14 of 47 positions shown, 16 images · non-contrast
Comparison: Brain MRI [DATE].  Head CT [DATE].
COMPARISON: Brain MRI [DATE].  Head CT [DATE].

Addendum:
CLINICAL DATA: Code stroke.  65-year-old female

EXAM:
CT HEAD WITHOUT CONTRAST
TECHNIQUE: Contiguous axial images were obtained from the base of the skull
through the vertex without intravenous contrast.

[Series 3: head wo · axial · 0.43mm/px · z∈[-179,-54]mm · 8 of 31 slices shown, 10 images]
[im 3/31  brain]
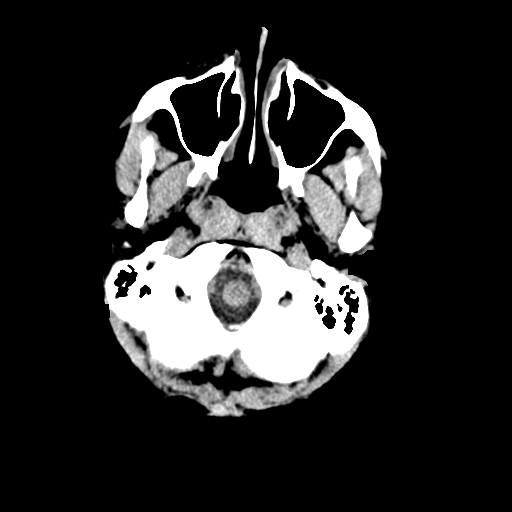
[im 3/31  bone]
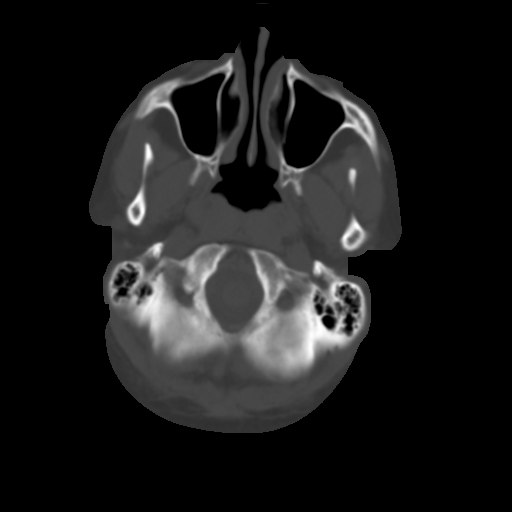
[im 7/31  brain]
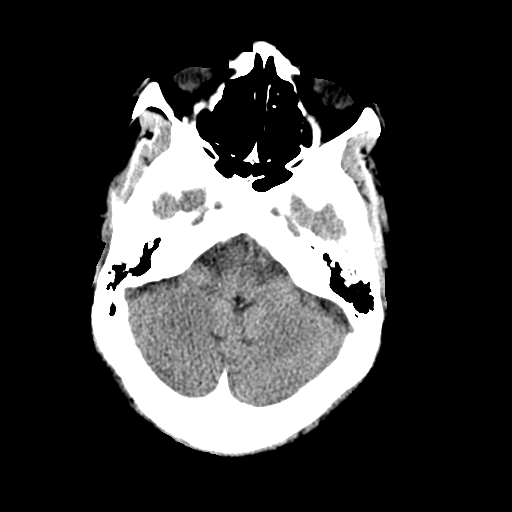
[im 10/31  brain]
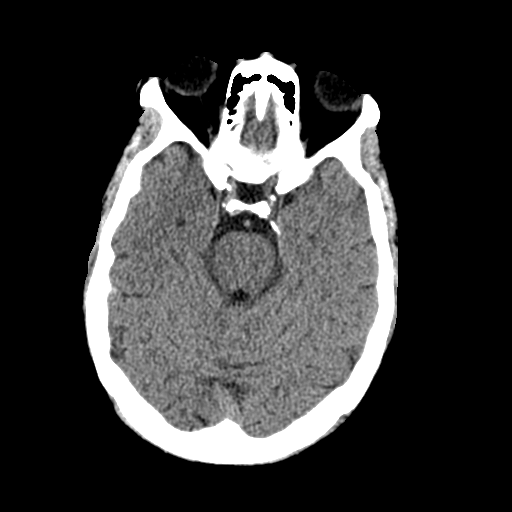
[im 14/31  brain]
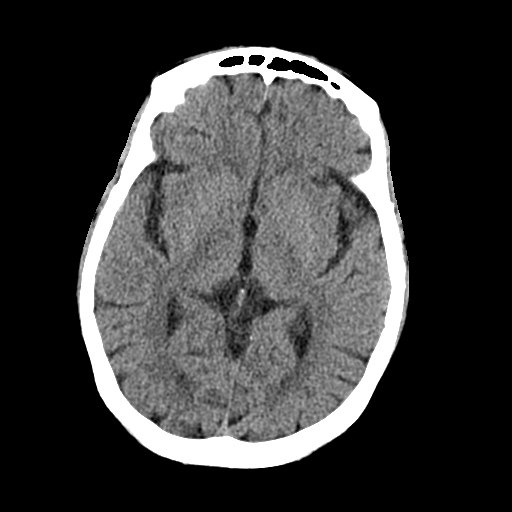
[im 17/31  brain]
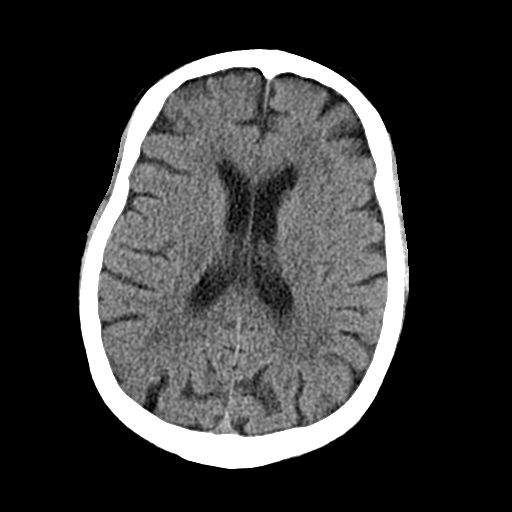
[im 17/31  bone]
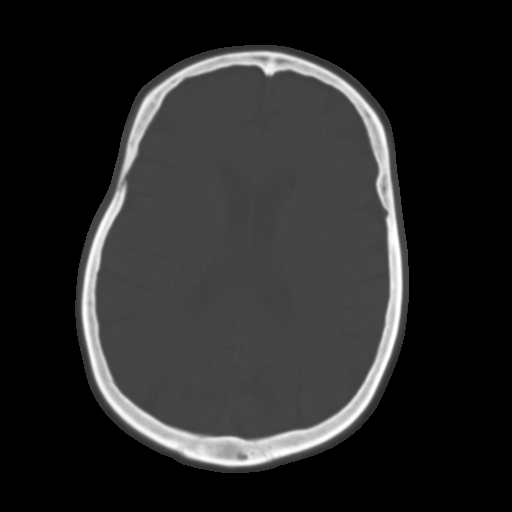
[im 21/31  brain]
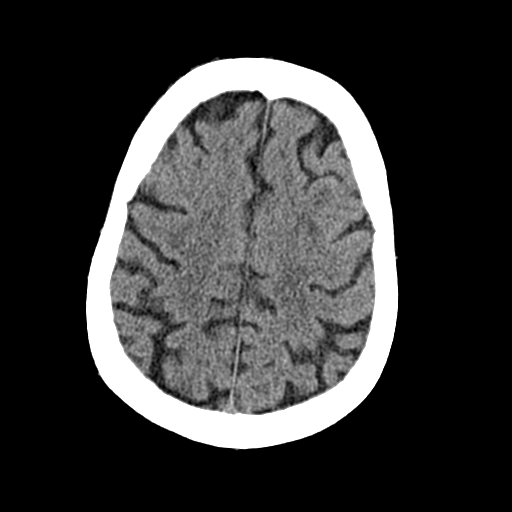
[im 24/31  brain]
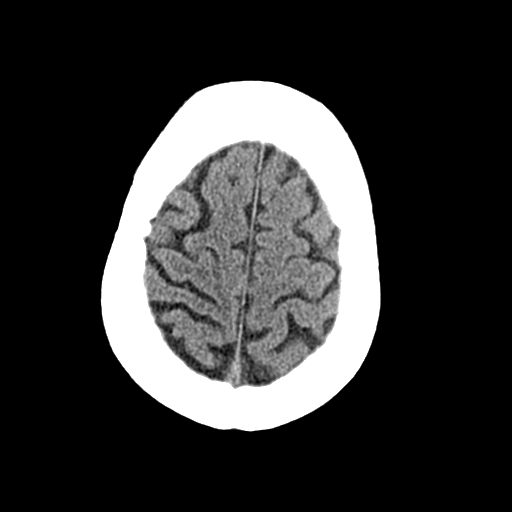
[im 28/31  brain]
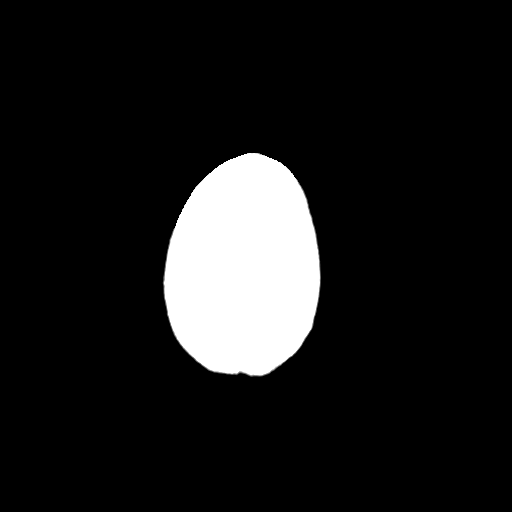

[Series 5: cor soft · coronal · 0.30mm/px · 3 of 72 slices shown]
[im 24/72  brain]
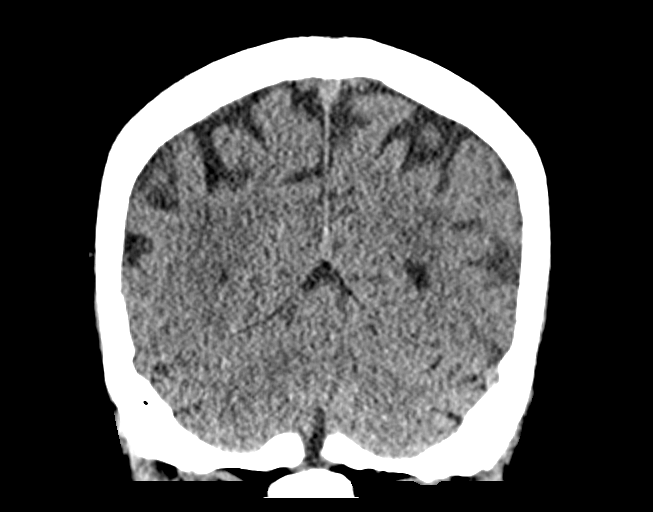
[im 32/72  brain]
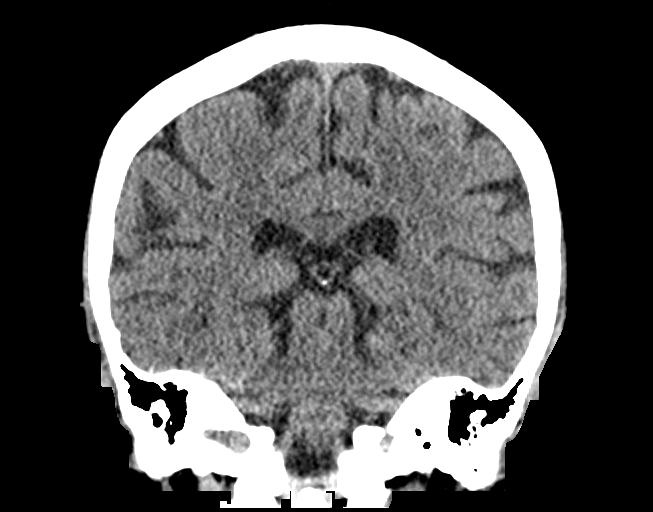
[im 40/72  brain]
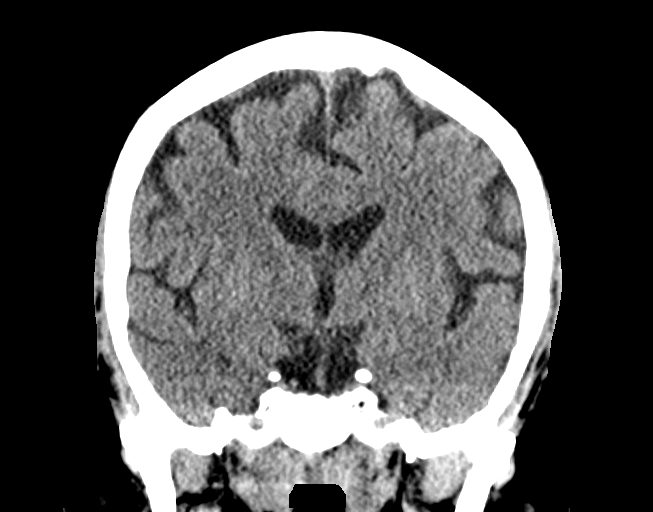

[Series 6: sag soft · sagittal · 0.30mm/px · 3 of 61 slices shown]
[im 21/61  brain]
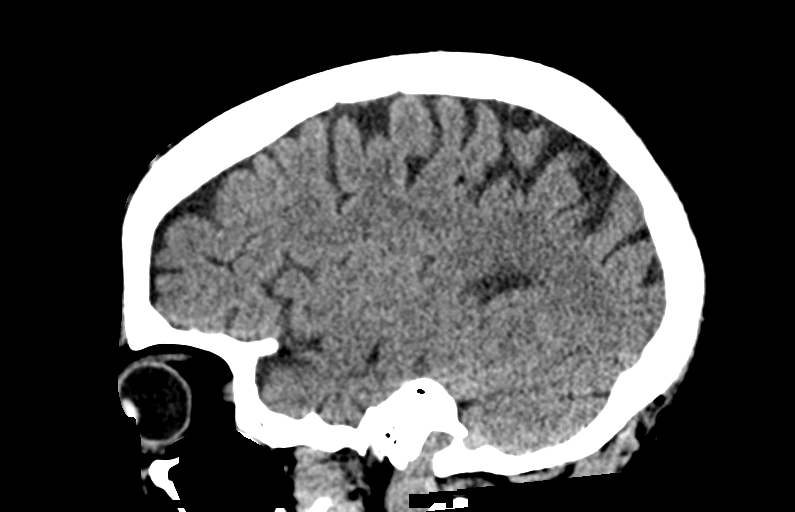
[im 31/61  brain]
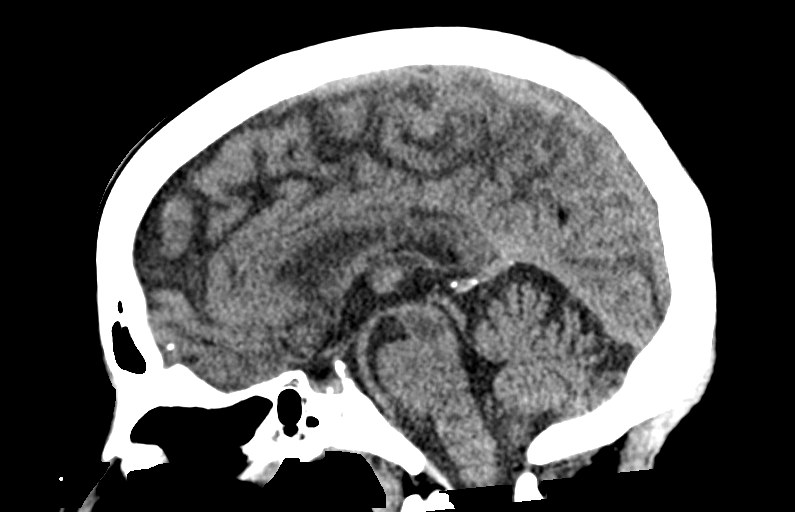
[im 41/61  brain]
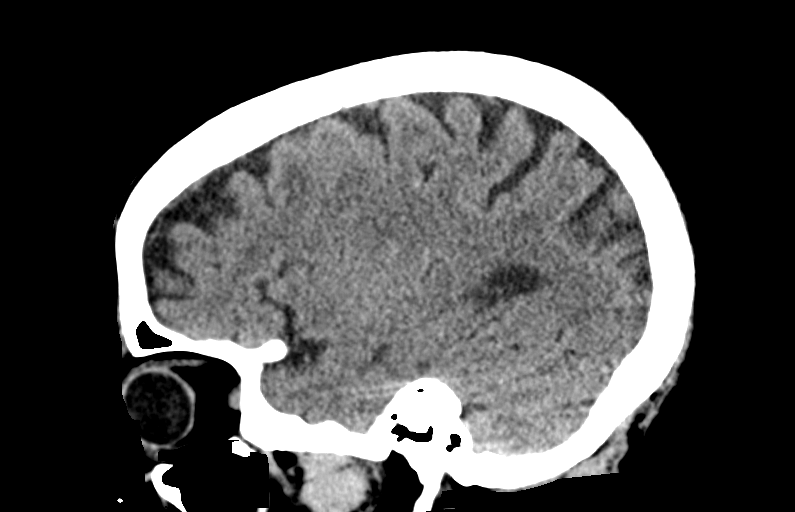

[14 of 47 positions shown; findings below may reference images not displayed]

FINDINGS: Brain: Cerebral volume remains normal for age. No midline shift,
ventriculomegaly, mass effect, evidence of mass lesion, intracranial
hemorrhage or evidence of cortically based acute infarction.
Gray-white matter differentiation is stable since last year, with
mild for age scattered mostly subcortical white matter hypodensity.

Vascular: Mild Calcified atherosclerosis at the skull base. No
suspicious intracranial vascular hyperdensity.

Skull: Stable, negative.

Sinuses/Orbits: Visualized paranasal sinuses and mastoids are stable
and well aerated.

Other: Visualized orbits and scalp soft tissues are within normal
limits.

ASPECTS ([place] Stroke Program Early CT Score)

Total score (0-10 with 10 being normal): 10
IMPRESSION: 1. No acute cortically based infarct or acute intracranial
hemorrhage identified. ASPECTS 10.
2. Mild for age nonspecific cerebral white matter changes.

ADDENDUM:
Study discussed by telephone with Dr. VAMBE on [DATE] at
[GG] hours.

*** End of Addendum ***
FINDINGS: Brain: Cerebral volume remains normal for age. No midline shift,
ventriculomegaly, mass effect, evidence of mass lesion, intracranial
hemorrhage or evidence of cortically based acute infarction.
Gray-white matter differentiation is stable since last year, with
mild for age scattered mostly subcortical white matter hypodensity.

Vascular: Mild Calcified atherosclerosis at the skull base. No
suspicious intracranial vascular hyperdensity.

Skull: Stable, negative.

Sinuses/Orbits: Visualized paranasal sinuses and mastoids are stable
and well aerated.

Other: Visualized orbits and scalp soft tissues are within normal
limits.

ASPECTS ([place] Stroke Program Early CT Score)

Total score (0-10 with 10 being normal): 10
IMPRESSION: 1. No acute cortically based infarct or acute intracranial
hemorrhage identified. ASPECTS 10.
2. Mild for age nonspecific cerebral white matter changes.

## 2020-09-24 IMAGING — CT CT ANGIO NECK
1 of 8 series · 6 of 33 positions shown · IV contrast (Omnipaque)
Comparison: Plain head CT [5U] hours today.
COMPARISON: Plain head CT [5U] hours today.

Addendum:
CLINICAL DATA: 65-year-old female code stroke presentation.

EXAM:
CT ANGIOGRAPHY HEAD AND NECK
TECHNIQUE: Multidetector CT imaging of the head and neck was performed using
the standard protocol during bolus administration of intravenous
contrast. Multiplanar CT image reconstructions and MIPs were
obtained to evaluate the vascular anatomy. Carotid stenosis
measurements (when applicable) are obtained utilizing NASCET
criteria, using the distal internal carotid diameter as the
denominator.
CONTRAST:  100mL OMNIPAQUE IOHEXOL 350 MG/ML SOLN

[Series 7: axial thin · axial · 0.39mm/px · z∈[-311,-91]mm · 6 of 308 slices shown]
[im 44/308  soft-tissue]
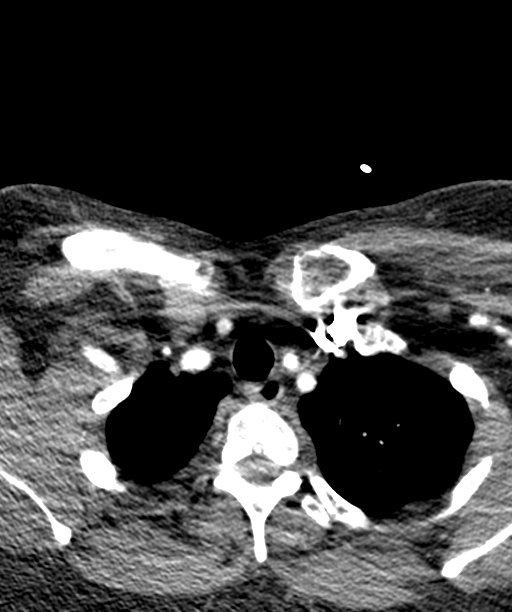
[im 88/308  bone]
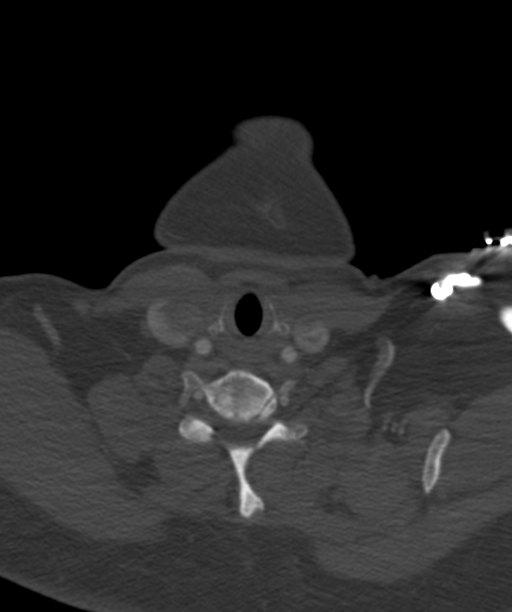
[im 132/308  soft-tissue]
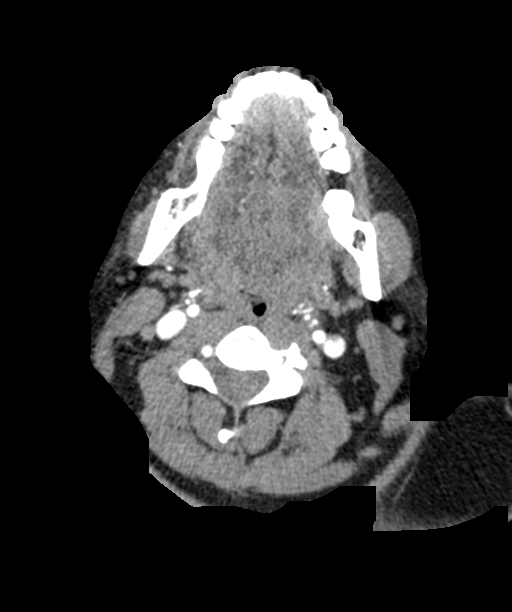
[im 176/308  bone]
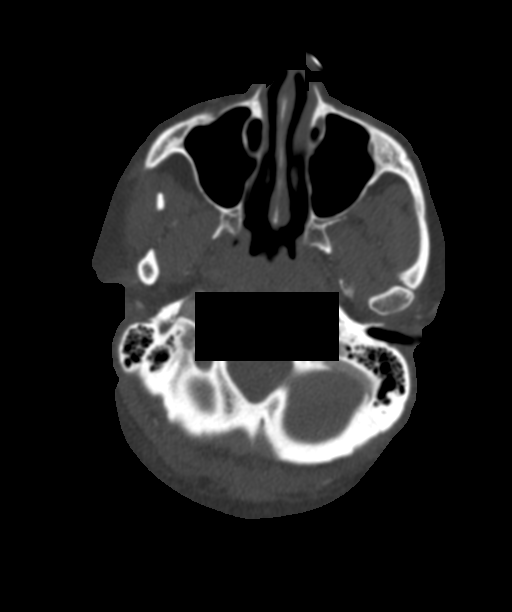
[im 220/308  soft-tissue]
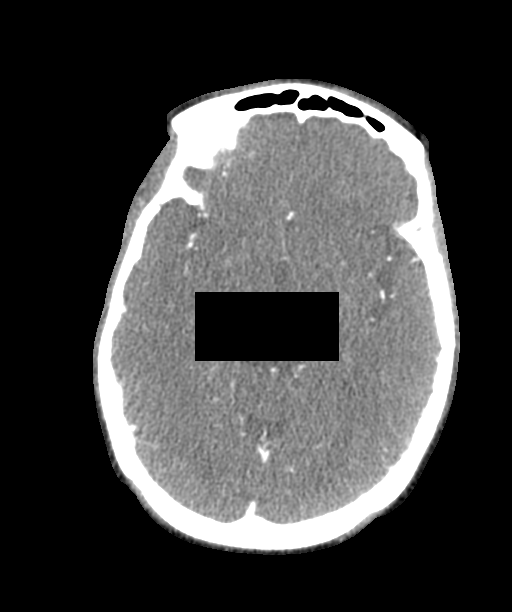
[im 264/308  bone]
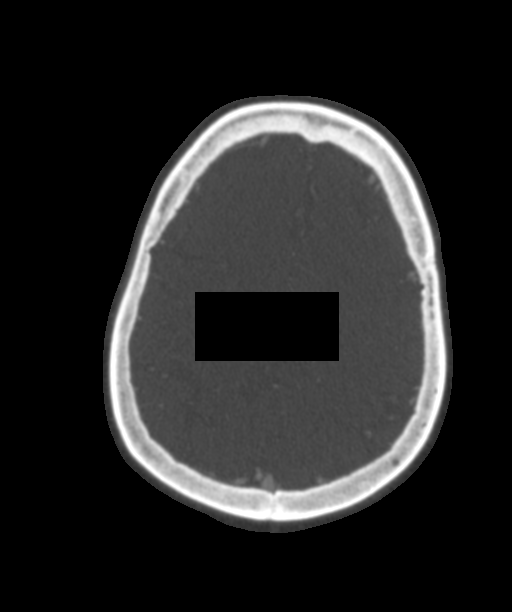

[6 of 33 positions shown; findings below may reference images not displayed]

FINDINGS: CTA NECK

Skeleton: No acute osseous abnormality identified. Cervical spine
disc and endplate degeneration.

Upper chest: Negative.

Other neck: Diminutive or absent thyroid. Effaced oropharynx appears
physiologic. Negative other visible neck soft tissues.

Aortic arch: 3 vessel arch configuration. Minimal arch
atherosclerosis.

Right carotid system: Negative.

Left carotid system: Negative.

Vertebral arteries:
Proximal right subclavian artery and right vertebral artery origin
are normal. Right vertebral is patent and normal to the skull base.

Mild plaque at the proximal left subclavian artery without stenosis.
Normal left vertebral artery origin. Tortuous left vertebral artery
is patent to the skull base without stenosis.

CTA HEAD

Posterior circulation: Mildly dominant right V4 segment. Patent
distal vertebral arteries to the basilar without plaque or stenosis.
Both AICAs appear dominant and patent. Patent basilar artery without
stenosis. Patent SCA and PCA origins. Posterior communicating
arteries are diminutive or absent. Bilateral PCA branches are within
normal limits.

Anterior circulation: Both ICA siphons are patent. There is mild
calcified siphon plaque and tortuosity bilaterally without stenosis.
Patent carotid termini. Patent MCA and ACA origins. Diminutive or
absent anterior communicating artery. Bilateral ACA branches are
within normal limits. Left MCA M1 segment and bifurcation are patent
without stenosis. Right MCA M1 segment and trifurcation are patent
without stenosis. Bilateral MCA branches are within normal limits.

Venous sinuses: Patent and within normal limits. Right
transverse/sigmoid sinus junction arachnoid granulation (normal).

Anatomic variants: Mildly dominant right vertebral artery.

Review of the MIP images confirms the above findings
IMPRESSION: 1. Largely negative CTA Head and Neck.
No large vessel occlusion. No atherosclerosis in the neck. Mild ICA
siphon atherosclerosis without stenosis.

2. Diminutive or absent thyroid.

ADDENDUM:
Study discussed by telephone with Dr. AQUINO on [DATE] at [5U]
hours.

*** End of Addendum ***
FINDINGS: CTA NECK

Skeleton: No acute osseous abnormality identified. Cervical spine
disc and endplate degeneration.

Upper chest: Negative.

Other neck: Diminutive or absent thyroid. Effaced oropharynx appears
physiologic. Negative other visible neck soft tissues.

Aortic arch: 3 vessel arch configuration. Minimal arch
atherosclerosis.

Right carotid system: Negative.

Left carotid system: Negative.

Vertebral arteries:
Proximal right subclavian artery and right vertebral artery origin
are normal. Right vertebral is patent and normal to the skull base.

Mild plaque at the proximal left subclavian artery without stenosis.
Normal left vertebral artery origin. Tortuous left vertebral artery
is patent to the skull base without stenosis.

CTA HEAD

Posterior circulation: Mildly dominant right V4 segment. Patent
distal vertebral arteries to the basilar without plaque or stenosis.
Both AICAs appear dominant and patent. Patent basilar artery without
stenosis. Patent SCA and PCA origins. Posterior communicating
arteries are diminutive or absent. Bilateral PCA branches are within
normal limits.

Anterior circulation: Both ICA siphons are patent. There is mild
calcified siphon plaque and tortuosity bilaterally without stenosis.
Patent carotid termini. Patent MCA and ACA origins. Diminutive or
absent anterior communicating artery. Bilateral ACA branches are
within normal limits. Left MCA M1 segment and bifurcation are patent
without stenosis. Right MCA M1 segment and trifurcation are patent
without stenosis. Bilateral MCA branches are within normal limits.

Venous sinuses: Patent and within normal limits. Right
transverse/sigmoid sinus junction arachnoid granulation (normal).

Anatomic variants: Mildly dominant right vertebral artery.

Review of the MIP images confirms the above findings
IMPRESSION: 1. Largely negative CTA Head and Neck.
No large vessel occlusion. No atherosclerosis in the neck. Mild ICA
siphon atherosclerosis without stenosis.

2. Diminutive or absent thyroid.

## 2020-09-24 MED ORDER — CLOPIDOGREL BISULFATE 75 MG PO TABS
75.0000 mg | ORAL_TABLET | Freq: Once | ORAL | Status: AC
  Administered 2020-09-24: 75 mg via ORAL
  Filled 2020-09-24: qty 1

## 2020-09-24 MED ORDER — POLYETHYLENE GLYCOL 3350 17 G PO PACK
17.0000 g | PACK | Freq: Every day | ORAL | Status: DC
  Administered 2020-09-25: 17 g via ORAL
  Filled 2020-09-24: qty 1

## 2020-09-24 MED ORDER — IRBESARTAN 300 MG PO TABS
150.0000 mg | ORAL_TABLET | Freq: Every day | ORAL | Status: DC
  Administered 2020-09-25: 150 mg via ORAL
  Filled 2020-09-24: qty 1

## 2020-09-24 MED ORDER — LEVOTHYROXINE SODIUM 88 MCG PO TABS: 44.0000 ug | ORAL_TABLET | ORAL | Status: DC

## 2020-09-24 MED ORDER — MECLIZINE HCL 25 MG PO TABS
25.0000 mg | ORAL_TABLET | Freq: Once | ORAL | Status: AC
  Administered 2020-09-24: 25 mg via ORAL
  Filled 2020-09-24: qty 1

## 2020-09-24 MED ORDER — POTASSIUM CHLORIDE CRYS ER 20 MEQ PO TBCR
20.0000 meq | EXTENDED_RELEASE_TABLET | Freq: Two times a day (BID) | ORAL | Status: DC
  Administered 2020-09-24 – 2020-09-25 (×2): 20 meq via ORAL
  Filled 2020-09-24 (×2): qty 1

## 2020-09-24 MED ORDER — ONDANSETRON HCL 4 MG/2ML IJ SOLN
4.0000 mg | Freq: Once | INTRAMUSCULAR | Status: AC
  Administered 2020-09-24: 4 mg via INTRAVENOUS
  Filled 2020-09-24: qty 2

## 2020-09-24 MED ORDER — ACETAMINOPHEN 325 MG PO TABS
650.0000 mg | ORAL_TABLET | Freq: Four times a day (QID) | ORAL | Status: DC | PRN
  Administered 2020-09-24: 650 mg via ORAL
  Filled 2020-09-24: qty 2

## 2020-09-24 MED ORDER — IOHEXOL 350 MG/ML SOLN
100.0000 mL | Freq: Once | INTRAVENOUS | Status: AC | PRN
  Administered 2020-09-24: 100 mL via INTRAVENOUS

## 2020-09-24 MED ORDER — ATORVASTATIN CALCIUM 40 MG PO TABS: 40.0000 mg | ORAL_TABLET | Freq: Every day | ORAL | Status: DC

## 2020-09-24 MED ORDER — LEVOTHYROXINE SODIUM 88 MCG PO TABS
88.0000 ug | ORAL_TABLET | ORAL | Status: DC
  Administered 2020-09-25: 88 ug via ORAL
  Filled 2020-09-24: qty 1

## 2020-09-24 MED ORDER — HYDROCHLOROTHIAZIDE 25 MG PO TABS
25.0000 mg | ORAL_TABLET | Freq: Every day | ORAL | Status: DC
  Administered 2020-09-25: 25 mg via ORAL
  Filled 2020-09-24: qty 1

## 2020-09-24 MED ORDER — BISACODYL 5 MG PO TBEC: 5.0000 mg | DELAYED_RELEASE_TABLET | Freq: Every day | ORAL | Status: DC | PRN

## 2020-09-24 MED ORDER — ENOXAPARIN SODIUM 40 MG/0.4ML IJ SOSY
40.0000 mg | PREFILLED_SYRINGE | Freq: Every day | INTRAMUSCULAR | Status: DC
  Administered 2020-09-24: 40 mg via SUBCUTANEOUS
  Filled 2020-09-24: qty 0.4

## 2020-09-24 MED ORDER — ACETAMINOPHEN 650 MG RE SUPP: 650.0000 mg | Freq: Four times a day (QID) | RECTAL | Status: DC | PRN

## 2020-09-24 NOTE — ED Notes (Signed)
Awaiting neurologist on telestroke

## 2020-09-24 NOTE — ED Notes (Signed)
Dr Bero at bedside.  

## 2020-09-24 NOTE — Consult Note (Addendum)
Cta h/n IMPRESSION: 1. Largely negative CTA Head and Neck. No large vessel occlusion. No atherosclerosis in the neck. Mild ICA siphon atherosclerosis without stenosis.  Not candidate for NIR.  Not candidate for thrombolytic therapy.  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 NIHSS 1 for left leg weakness. R/o new stroke vs worsening of old stroke findings vs less complicated migraine or hypertensive emergency  Metrics: Last Known Well: 09/23/2020 00:00:00 [facility] Notification Time: 09/24/2020 06:45:30 Arrival Time: 09/24/2020 06:00:00 Stamp Time: 09/24/2020 06:45:30 Initial Response Time: 09/24/2020 06:50:20 Symptoms: vertigo. NIHSS Start Assessment Time: 09/24/2020 07:10:29 Patient is not a candidate for Thrombolytic. Thrombolytic Medical Decision: 09/24/2020 07:00:00 Patient was not deemed candidate for Thrombolytic because of following reasons: Last Well Known Above 4.5 Hours.  CT head showed no acute hemorrhage or acute core infarct.  ED Physician notified of diagnostic impression and management plan on 09/24/2020 07:19:21  Advanced Imaging: CTA Head and Neck Ordered:   Our recommendations are outlined below.  Recommendations:        Stroke/Telemetry Floor       Neuro Checks       Bedside Swallow Eval       DVT Prophylaxis       IV Fluids, Normal Saline       Head of Bed 30 Degrees       Euglycemia and Avoid  Hyperthermia (PRN Acetaminophen)       Initiate dual antiplatelet therapy with Aspirin 81 mg daily and Clopidogrel 75 mg daily.  Routine Consultation with [facility] for Follow up Care  Sign Out:       Discussed with Emergency Department Provider    ------------------------------------------------------------------------------  History of Present Illness: Patient is a 65 year old Female.  Patient was brought by EMS for symptoms of vertigo.  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 She endorses everything is spinning. She was told she had a cerebral aneurysm vs infundibulum (she is unsure). She endorses feeling tightness around her head.   Past Medical History:      Hypertension      Diabetes Mellitus      Hyperlipidemia      hypothyroidism, migranes  Social History:Unable to obtain due to Patient Status  Family History:Unable to obtain due to Patient Status  Review of System:  14 Points Review of Systems was performed and was negative except mentioned in HPI.  Anticoagulant use:  No  Antiplatelet use: Yes asa '81mg'$  daily  Allergies:  Reviewed Allergies Text: tPM, empaglifozin, nortriptyline    Examination: BP(162/79), Pulse(77), Blood Glucose(93) 1A: Level of Consciousness - Alert; keenly responsive + 0 1B: Ask Month  and Age - Both Questions Right + 0 1C: Blink Eyes & Squeeze Hands - Performs Both Tasks + 0 2: Test Horizontal Extraocular Movements - Normal + 0 3: Test Visual Fields - No Visual Loss + 0 4: Test Facial Palsy (Use Grimace if Obtunded) - Normal symmetry + 0 5A: Test Left Arm Motor  Drift - No Drift for 10 Seconds + 0 5B: Test Right Arm Motor Drift - No Drift for 10 Seconds + 0 6A: Test Left Leg Motor Drift - Drift, but doesn't hit bed + 1 6B: Test Right Leg Motor Drift - No Drift for 5 Seconds + 0 7: Test Limb Ataxia (FNF/Heel-Shin) - No Ataxia + 0 8: Test Sensation - Normal; No sensory loss + 0 9: Test Language/Aphasia - Normal; No aphasia + 0 10: Test Dysarthria - Normal + 0 11: Test Extinction/Inattention - No abnormality + 0  NIHSS Score: 1   Pre-Morbid Modified Rankin Scale: 0 Points = No symptoms at all   Patient/Family was informed the Neurology Consult would occur via TeleHealth consult by way of interactive audio and video telecommunications and consented to receiving care in this manner.   Patient is being evaluated for possible acute neurologic impairment and high probability of imminent or life-threatening deterioration. I spent total of 25 minutes providing care to this patient, including time for face to face visit via telemedicine, review of medical records, imaging studies and discussion of findings with providers, the patient and/or family.   Dr Barron Schmid   [facility] [phone]  Case [id number]

## 2020-09-24 NOTE — ED Notes (Signed)
Pt has returned from CT.  

## 2020-09-24 NOTE — Consult Note (Signed)
Neurology Consultation Reason for Consult: Dizziness  Requesting Physician: Doristine Mango  CC: Dizziness, new weakness on the left side  History is obtained from:   HPI: Terri Waters is a 65 y.o. female with a past medical history significant for BPPV, hypertension, hyperlipidemia, diabetes, papillary carcinoma s/p thyroidectomy C/B hypothyroidism, migraine headaches, stroke (left cerebellar, 2021, no residual deficits), ventricular tachycardia.  She reports that she noted yesterday when she woke up that she had dizziness anytime she moves her head, particularly when she moves her head certain directions such as down.  This does improve if she lays still.  The dizziness is always room spinning in the counterclockwise direction.  She denies any other focal neurological deficits other than noting she has recently been started on B12 injections for bilateral hand numbness which has improved but not resolved with this treatment.  Additionally she has been having frequent migraine headaches for the past 2 weeks and taking sumatriptan on a daily basis, up to 4 times a day.  She did call her neurologist recently who has prescribed Emgality as well for better migraine prevention.  She reports she had an MRI cervical spine imaging in June that showed "bone spurs" at C6/C7 and C4/C5 which she is planning to manage conservatively with injections and hoping to defer need for surgery.  She was evaluated by teleneurology and found to have left leg weakness, and given a dose of Plavix in addition to her prior aspirin 81 mg daily, and recommended for admission for stroke/TIA work-up versus recrudescence versus complicated migraine versus hypertensive emergency  Regarding her prior stroke, 06/23/2018 when she presented with acute onset vertigo and left hand clumsiness with a punctate focus of restricted diffusion in the left cerebellum.  She was treated with dual antiplatelet therapy for 21-day course and has  continued on aspirin monotherapy.  She notes that she was diagnosed with BPPV at that time and that her symptoms improved with physical therapy.  She did not try the home Epley maneuvers that she was taught at that time but does have the sheet at home.  She was concerned this episode may be something different due to her headache  LKW: 09/23/2020 at midnight tPA given?: No, per teleneurology out of the window at the time of their evaluation Premorbid modified rankin scale:      0 - No symptoms.   ROS: All other review of systems was negative except as noted in the HPI.   Past Medical History:  Diagnosis Date   Cancer ([place])    papillary carcinoma   Diabetes mellitus without complication ([place])    High cholesterol    Hypertension    Hypothyroidism    Migraines    Stroke [facility])    Thyroid disease    Ventricular tachycardia ([place])    Past Surgical History:  Procedure Laterality Date   ABDOMINAL HYSTERECTOMY     BREAST SURGERY     lumps removed - over 20 years ago   THYROIDECTOMY     TOTAL KNEE ARTHROPLASTY Right 12/18/2017   Procedure: RIGHT TOTAL KNEE ARTHROPLASTY;  Surgeon: Netta Cedars, MD;  Location: [facility];  Service: Orthopedics;  Laterality: Right;   Current Outpatient Medications  Medication Instructions   Aspirin Buf,CaCarb-MgCarb-MgO, 81 MG TABS aspirin 81 mg tablet   81 mg by oral route.   atorvastatin (LIPITOR) 40 MG tablet 1 tablet, Oral, Daily   atorvastatin (LIPITOR) 40 mg, Oral, Daily   Cholecalciferol 125 MCG (5000 UT) capsule Oral   clopidogrel (  PLAVIX) 75 mg, Oral, Daily   clotrimazole-betamethasone (LOTRISONE) cream clotrimazole-betamethasone 1 %-0.05 % topical cream   conjugated estrogens (PREMARIN) vaginal cream INSERT 1/4 TH GRAM  VAGINALLY TWICE WEEKLY AS  DIRECTED   cyanocobalamin (,VITAMIN B-12,) 1000 MCG/ML injection Inject 1,000 mcg as directed 4 (four) times a week. Start with daily injections for 4 days then weekly for 4 weeks.   diltiazem (TIAZAC) 120  MG 24 hr capsule diltiazem CD 120 mg capsule,extended release 24 hr   Dulaglutide (TRULICITY [place]) Subcutaneous   Dulaglutide (TRULICITY) A999333 0000000 SOPN Trulicity A999333 99991111 mL subcutaneous pen injector   glimepiride (AMARYL) 2 MG tablet Take by mouth.   glimepiride (AMARYL) 2 mg, 2 times daily   hydrochlorothiazide (HYDRODIURIL) 25 mg, Oral, Daily   levothyroxine (SYNTHROID) 44-88 mcg, Oral, See admin instructions, Take 1 tablet by mouth daily except on Sundays take 44 mcg per patient   LEVOTHYROXINE SODIUM PO levothyroxine   MAGNESIUM PO Take by mouth.   metFORMIN (GLUCOPHAGE) 1000 MG tablet metformin 1,000 mg tablet  Take 1 tablet twice a day by oral route.   metFORMIN (GLUCOPHAGE-XR) 1,000 mg, Oral, 2 times daily   metoprolol succinate (TOPROL-XL) 50 mg, 2 times daily   niacin (NIASPAN) 500 MG CR tablet Take by mouth.   niacin (NIASPAN) 500 mg, Daily   NIACIN CR PO Niacin CR 400 mg capsule,extended release  Take by oral route.   olmesartan (BENICAR) 20 MG tablet 2 tablets, Oral, Daily   olmesartan (BENICAR) 20 mg, 2 times daily   pantoprazole (PROTONIX) 40 mg, Oral, 2 times daily before meals   potassium chloride SA (KLOR-CON) 20 MEQ tablet 20 mEq, Oral, 2 times daily   topiramate (TOPAMAX) 25 mg, Oral, 2 times daily   Trulicity A999333 mg, Subcutaneous, Weekly   Vitamin D3 50,000 Units, Every Sun   Zinc Methionate 50 MG CAPS Oral    Current Facility-Administered Medications:    acetaminophen (TYLENOL) tablet 650 mg, 650 mg, Oral, Q6H PRN, 650 mg at 09/24/20 2307 **OR** acetaminophen (TYLENOL) suppository 650 mg, 650 mg, Rectal, Q6H PRN, Richarda Osmond, MD   [START ON 09/25/2020] atorvastatin (LIPITOR) tablet 40 mg, 40 mg, Oral, Daily, Anderson, Carlynn Herald, MD   bisacodyl (DULCOLAX) EC tablet 5 mg, 5 mg, Oral, Daily PRN, Richarda Osmond, MD   enoxaparin (LOVENOX) injection 40 mg, 40 mg, Subcutaneous, QHS, Doristine Mango L, MD, 40 mg at 09/24/20 2308   [START ON  09/25/2020] hydrochlorothiazide (HYDRODIURIL) tablet 25 mg, 25 mg, Oral, Daily, Richarda Osmond, MD   [START ON 09/25/2020] irbesartan (AVAPRO) tablet 150 mg, 150 mg, Oral, Daily, Richarda Osmond, MD   [START ON 09/25/2020] levothyroxine (SYNTHROID) tablet 88 mcg, 88 mcg, Oral, Once per day on Mon Tue Wed Thu Fri Sat **AND** [START ON 09/30/2020] levothyroxine (SYNTHROID) tablet 44 mcg, 44 mcg, Oral, Q Sun, Anderson, Carlynn Herald, MD   [START ON 09/25/2020] polyethylene glycol (MIRALAX / GLYCOLAX) packet 17 g, 17 g, Oral, Daily, Anderson, Chelsey L, MD   potassium chloride SA (KLOR-CON) CR tablet 20 mEq, 20 mEq, Oral, BID, Richarda Osmond, MD, 20 mEq at 09/24/20 2308   Family History  Problem Relation Age of Onset   Heart disease Mother    Chronic Renal Failure Mother    Diabetes Mother    Stroke Mother    Stroke Father    Hypertension Father    High Cholesterol Father    Breast cancer Maternal Grandmother    Colon cancer Maternal Grandmother  Colon cancer Maternal Grandfather    Stroke Sister     Social History:  reports that she has never smoked. She has never used smokeless tobacco. She reports that she does not drink alcohol and does not use drugs.   Exam: Current vital signs: BP 131/69   Pulse 68   Temp 98.3 F (36.8 C) (Oral)   Resp 17   Ht '5\' 3"'$  (1.6 m)   Wt 104 kg   SpO2 97%   BMI 40.61 kg/m  Vital signs in last 24 hours: Temp:  [98.1 F (36.7 C)-98.3 F (36.8 C)] 98.3 F (36.8 C) (09/12 2145) Pulse Rate:  [63-74] 68 (09/12 2145) Resp:  [13-24] 17 (09/12 2145) BP: (102-162)/(45-79) 131/69 (09/12 2145) SpO2:  [92 %-100 %] 97 % (09/12 2145) Weight:  [102.5 kg-104 kg] 104 kg (09/12 2145)   Physical Exam  Constitutional: Appears well-developed and well-nourished.  Obese Psych: Affect appropriate to situation, calm, cooperative and pleasant Eyes: No scleral injection HENT: No oropharyngeal obstruction.  MSK: no joint deformities.  Cardiovascular: Normal  rate and regular rhythm.  Respiratory: Effort normal, non-labored breathing GI: Soft.  No distension. There is no tenderness.  Skin: Warm dry and intact visible skin  Neuro: Mental Status: Patient is awake, alert, oriented to person, place, month, year, and situation. Patient is able to give a clear and coherent history. No signs of aphasia or neglect Cranial Nerves: II: Visual Fields are full. Pupils are equal, round, and reactive to light.   III,IV, VI: EOMI without ptosis or diploplia.  V: Facial sensation is symmetric to temperature VII: Facial movement is symmetric.  VIII: hearing is intact to voice X: Uvula elevates symmetrically XI: Shoulder shrug is symmetric. XII: tongue is midline without atrophy or fasciculations.   HINTS exam: On head impulse testing she closes her eyes when I move her head and endorses dizziness, though when I asked her to open her eyes there is no clear nystagmus.  No nystagmus on end gaze that she reports slightly blurry vision on left gaze.  Test of skew is negative  Dix-Hallpike was not performed due to time constraints (code stroke response)  Motor: Tone is normal. Bulk is normal.  She has significant weakness in the left arm and leg, with some element of sudden loss of tone consistent with giveaway weakness though I suspect some true underlying weakness as well. Sensory: Light dependent loss of temperature sensation in the bilateral legs, mild dependent temperature loss on the left arm but not the right arm Deep Tendon Reflexes: 3+ in the left brachioradialis and patella compared to 2+ on the right Cerebellar: FNF and HKS are intact bilaterally  NIHSS total 1 Score breakdown: Left lower extremity drift    I have reviewed labs in epic and the results pertinent to this consultation are:   Lab Results  Component Value Date   CHOL 128 06/24/2019   HDL 52 06/24/2019   LDLCALC 59 06/24/2019   TRIG 83 06/24/2019   CHOLHDL 2.5 06/24/2019    Lab Results  Component Value Date   HGBA1C 7.6 (H) 06/24/2019     Lab Results  Component Value Date   CHOL 113 09/25/2020   HDL 50 09/25/2020   LDLCALC 53 09/25/2020   TRIG 49 09/25/2020   CHOLHDL 2.3 09/25/2020   Lab Results  Component Value Date   HGBA1C 6.6 (H) 09/25/2020    I have reviewed the images obtained:  Head CT personally reviewed, negative for acute intracranial process CTA  personally reviewed, negative for LVO or significant narrowing   ECHO 06/24/19 1. Left ventricular ejection fraction, by estimation, is 55 to 60%. The  left ventricle has normal function. The left ventricle has no regional  wall motion abnormalities. Left ventricular diastolic parameters were  normal.   2. Right ventricular systolic function is normal. The right ventricular  size is normal. There is normal pulmonary artery systolic pressure.   3. The mitral valve is normal in structure. Trivial mitral valve  regurgitation. No evidence of mitral stenosis.   4. The aortic valve is normal in structure. Aortic valve regurgitation is  not visualized. No aortic stenosis is present.   5. The inferior vena cava is normal in size with greater than 50%  respiratory variability, suggesting right atrial pressure of 3 mmHg.  Impression: This is a 65 year old woman with multiple vascular risk factors, however her lipid panel is well controlled as is her A1c at this time, with improvement by 1 percentage point since her last episode in June 2021.  Overall her description of her symptoms sounds more consistent with BPPV than with a central process, although HINTS exam is limited by her eye closure with head impulse testing, making it difficult to ascertain if she does indeed have catch-up saccades.  I am concerned that her left-sided weakness may be more secondary to her known cervical myelopathy being exacerbated, potentially in the setting of a B12 myelopathy concurrently.  Recommendations: -MRI cervical  spine in addition to MRI brain -Echocardiogram pending -Hold Plavix for now in case surgical intervention is needed for MRI spine findings -Vestibular PT evaluation for Dix-Hallpike/Epley maneuvers to determine if this reproduces and resolves patient's symptoms -To err on the side of caution, allow for permissive hypertension pending MRI brain -Continue aspirin 81 mg daily, atorvastatin 40 mg nightly -Continue B12 supplementation, recheck B12 and MMA levels and if B12 remains less than 500, consider daily supplementation x3 days again, followed by weekly supplementation for 8 weeks -Neurology will follow up MRI brain and cervical spine as well as echocardiogram but otherwise will be available on an as-needed basis going forward  [place] [phone] Available 7 PM to 7 AM, outside of these hours please call Neurologist on call as listed on Amion.

## 2020-09-24 NOTE — ED Provider Notes (Signed)
[facility] Emergency Department Provider Note MRN:  [id number]  Arrival date & time: 09/24/20     Chief Complaint   Dizziness and Code Stroke   History of Present Illness   Terri Waters is a 65 y.o. year-old female with a history of diabetes, stroke presenting to the ED with chief complaint of dizziness.  Patient was having some intermittent blurred vision 2 days ago.  Yesterday noticed a mild headache when going to bed.  Went to bed at 9 PM.  Woke up at 4:30 AM this morning and noticed dizziness described as a room spinning sensation.  Feels similar to recent cerebellar stroke.  Continued headache, nausea, denies vomiting.  No chest pain or shortness of breath, no abdominal pain, no numbness or weakness to the arms or legs.  Review of Systems  A complete 10 system review of systems was obtained and all systems are negative except as noted in the HPI and PMH.   Patient's Health History    Past Medical History:  Diagnosis Date   Cancer ([place])    papillary carcinoma   Diabetes mellitus without complication ([place])    High cholesterol    Hypertension    Hypothyroidism    Migraines    Stroke [facility])    Thyroid disease    Ventricular tachycardia ([place])     Past Surgical History:  Procedure Laterality Date   ABDOMINAL HYSTERECTOMY     BREAST SURGERY     lumps removed - over 20 years ago   THYROIDECTOMY     TOTAL KNEE ARTHROPLASTY Right 12/18/2017   Procedure: RIGHT TOTAL KNEE ARTHROPLASTY;  Surgeon: Netta Cedars, MD;  Location: [facility];  Service: Orthopedics;  Laterality: Right;    Family History  Problem Relation Age of Onset   Heart disease Mother    Chronic Renal Failure Mother    Diabetes Mother    Stroke Mother    Stroke Father    Hypertension Father    High Cholesterol Father    Breast cancer Maternal Grandmother    Colon cancer Maternal Grandmother    Colon cancer Maternal Grandfather    Stroke Sister     Social History    Socioeconomic History   Marital status: Married    Spouse name: Not on file   Number of children: Not on file   Years of education: Not on file   Highest education level: Not on file  Occupational History   Not on file  Tobacco Use   Smoking status: Never   Smokeless tobacco: Never  Vaping Use   Vaping Use: Never used  Substance and Sexual Activity   Alcohol use: No   Drug use: No   Sexual activity: Not on file  Other Topics Concern   Not on file  Social History Narrative   Not on file   Social Determinants of Health   Financial Resource Strain: Not on file  Food Insecurity: Not on file  Transportation Needs: Not on file  Physical Activity: Not on file  Stress: Not on file  Social Connections: Not on file  Intimate Partner Violence: Not on file     Physical Exam   Vitals:   09/24/20 0625  BP: (!) 160/79  Pulse: 70  Resp: 16  Temp: 98.1 F (36.7 C)  SpO2: 100%    CONSTITUTIONAL: Well-appearing, NAD NEURO:  Alert and oriented x 3, left subtle pronator drift, otherwise normal and symmetric strength and sensation, normal coordination, normal speech EYES:  eyes equal and reactive ENT/NECK:  no LAD, no JVD CARDIO: Regular rate, well-perfused, normal S1 and S2 PULM:  CTAB no wheezing or rhonchi GI/GU:  normal bowel sounds, non-distended, non-tender MSK/SPINE:  No gross deformities, no edema SKIN:  no rash, atraumatic PSYCH:  Appropriate speech and behavior  *Additional and/or pertinent findings included in MDM below  Diagnostic and Interventional Summary    EKG Interpretation  Date/Time:  Monday September 24 2020 06:11:16 EDT Ventricular Rate:  70 PR Interval:  156 QRS Duration: 88 QT Interval:  411 QTC Calculation: 444 R Axis:   24 Text Interpretation: Sinus rhythm Atrial premature complexes Confirmed by Gerlene Fee [phone]) on 09/24/2020 6:14:00 AM       Labs Reviewed  COMPREHENSIVE METABOLIC PANEL - Abnormal; Notable for the following  components:      Result Value   Glucose, Bld 112 (*)    All other components within normal limits  CBC - Abnormal; Notable for the following components:   Hemoglobin 11.6 (*)    HCT 35.3 (*)    All other components within normal limits  RESP PANEL BY RT-PCR (FLU A&B, COVID) ARPGX2  LIPASE, BLOOD  PROTIME-INR  APTT  DIFFERENTIAL  URINALYSIS, ROUTINE W REFLEX MICROSCOPIC  ETHANOL  RAPID URINE DRUG SCREEN, HOSP PERFORMED  CBG MONITORING, ED  CBG MONITORING, ED    CT HEAD CODE STROKE WO CONTRAST    (Results Pending)  CT Angio Head W or Wo Contrast    (Results Pending)  CT Angio Neck W and/or Wo Contrast    (Results Pending)    Medications  iohexol (OMNIPAQUE) 350 MG/ML injection 100 mL (100 mLs Intravenous Contrast Given 09/24/20 0651)     Procedures  /  Critical Care .Critical Care Performed by: Maudie Flakes, MD Authorized by: Maudie Flakes, MD   Critical care provider statement:    Critical care time (minutes):  35   Critical care was necessary to treat or prevent imminent or life-threatening deterioration of the following conditions: Concern for acute ischemic stroke.   Critical care was time spent personally by me on the following activities:  Discussions with consultants, evaluation of patient's response to treatment, examination of patient, ordering and performing treatments and interventions, ordering and review of laboratory studies, ordering and review of radiographic studies, pulse oximetry, re-evaluation of patient's condition, obtaining history from patient or surrogate and review of old charts  ED Course and Medical Decision Making  I have reviewed the triage vital signs, the nursing notes, and pertinent available records from the EMR.  Listed above are laboratory and imaging tests that I personally ordered, reviewed, and interpreted and then considered in my medical decision making (see below for details).  Concern for posterior circulation stroke, onset  possibly within the past 7 hours.  Code stroke initiated, awaiting CTA imaging and teleneurology recommendations.  Signed out to oncoming provider at shift change.       Barth Kirks. Sedonia Small, [place] [email or web address]  Final Clinical Impressions(s) / ED Diagnoses     ICD-10-CM   1. Vertigo  R42       ED Discharge Orders     None        Discharge Instructions Discussed with and Provided to Patient:   Discharge Instructions   None       Maudie Flakes, MD 09/24/20 [phone]

## 2020-09-24 NOTE — H&P (Signed)
History and Physical    Terri Waters [id number] DOB: 12/15/1955 DOA: 09/24/2020 PCP: [name], MD  Chief Complaint: dizziness Historian: patient HPI:  Terri Waters is a 65 y.o. female with a PMH significant for non-insulin dependent DM, HLD, hypothyroidism s/p papillary ca, complex migraines, possible h/o TIA vs benign paroxysmal vertigo. They presented from home to [facility] on 09/24/2020 with several hours of dizziness, nausea, diaphoresis which is similar to the episodes she had last June and was diagnosed with benign paroxysmal vertigo which resolved with PT. She states that she had a headache when she went to bed last night and when she got up this morning, she was having positional vertigo with nausea/vomiting. In the ED, it was found that they had no LVO on imaging.  They were treated with aspirin, plavix.  Currently, she has no dizziness at baseline but present when she moves positions quickly and also a headache in right occipital area which is typical for her.  She was seen a couple months ago by ophthalmology and said she did not need new prescription but she has been staring at screens a lot more lately and having increased frequency of headaches. She typically takes tylenol or amitriptyline. She took only her regular medications this morning.  Denies chest pain or SOB.  Patient was admitted to medicine service for further workup and management of vertigo as outlined in detail below.  Review of Systems: As per HPI otherwise 10 point review of systems negative.   Past Medical History:  Diagnosis Date   Cancer ([place])    papillary carcinoma   Diabetes mellitus without complication ([place])    High cholesterol    Hypertension    Hypothyroidism    Migraines    Stroke [facility])    Thyroid disease    Ventricular tachycardia ([place])     Past Surgical History:  Procedure Laterality Date   ABDOMINAL HYSTERECTOMY     BREAST SURGERY     lumps removed - over 20 years ago    THYROIDECTOMY     TOTAL KNEE ARTHROPLASTY Right 12/18/2017   Procedure: RIGHT TOTAL KNEE ARTHROPLASTY;  Surgeon: Netta Cedars, MD;  Location: [place];  Service: Orthopedics;  Laterality: Right;     reports that she has never smoked. She has never used smokeless tobacco. She reports that she does not drink alcohol and does not use drugs.  Allergies  Allergen Reactions   Jardiance [Empagliflozin]     Frequent UTI   Tape Rash    Clear plastic tape    Family History  Problem Relation Age of Onset   Heart disease Mother    Chronic Renal Failure Mother    Diabetes Mother    Stroke Mother    Stroke Father    Hypertension Father    High Cholesterol Father    Breast cancer Maternal Grandmother    Colon cancer Maternal Grandmother    Colon cancer Maternal Grandfather    Stroke Sister     Prior to Admission medications   Medication Sig Start Date End Date Taking? Authorizing Provider  Aspirin Buf,CaCarb-MgCarb-MgO, 81 MG TABS aspirin 81 mg tablet   81 mg by oral route. 12/18/17   [provider]  atorvastatin (LIPITOR) 40 MG tablet Take 40 mg by mouth daily.    [provider]  atorvastatin (LIPITOR) 40 MG tablet Take 1 tablet by mouth daily.  12/28/18   [provider]  Cholecalciferol (VITAMIN D3) 1.25 MG (50000 UT) CAPS Take 50,000 Units  by mouth every Sunday. Patient not taking: Reported on 09/20/2020    [provider]  Cholecalciferol 125 MCG (5000 UT) capsule Take by mouth. 07/19/15   [provider]  clopidogrel (PLAVIX) 75 MG tablet Take 1 tablet (75 mg total) by mouth daily. Patient not taking: No sig reported 06/25/19   Daisy Floro, DO  clotrimazole-betamethasone (LOTRISONE) cream clotrimazole-betamethasone 1 %-0.05 % topical cream Patient not taking: Reported on 09/20/2020 04/02/17   [provider]  conjugated estrogens (PREMARIN) vaginal cream INSERT 1/4 TH GRAM  VAGINALLY TWICE WEEKLY AS  DIRECTED 09/21/18   [provider]  cyanocobalamin (,VITAMIN B-12,) 1000 MCG/ML injection Inject 1,000 mcg as directed 4 (four) times a week. Start with daily injections for 4 days then weekly for 4 weeks. 06/28/19   [provider]  diltiazem (TIAZAC) 120 MG 24 hr capsule diltiazem CD 120 mg capsule,extended release 24 hr Patient not taking: No sig reported    [provider]  Dulaglutide (TRULICITY [place]) Inject into the skin.    [provider]  Dulaglutide (TRULICITY) A999333 0000000 SOPN Trulicity [id number] [id number] mL subcutaneous pen injector    [provider]  glimepiride (AMARYL) 2 MG tablet Take 2 mg by mouth 2 (two) times daily.  Patient not taking: No sig reported 06/28/14   [provider]  glimepiride (AMARYL) 2 MG tablet Take by mouth. Patient not taking: No sig reported 06/28/14   [provider]  hydrochlorothiazide (HYDRODIURIL) 25 MG tablet Take 25 mg by mouth daily.    [provider]  levothyroxine (SYNTHROID) 88 MCG tablet Take 44-88 mcg by mouth See admin instructions. Take 1 tablet by mouth daily except on Sundays take 44 mcg per patient    [provider]  LEVOTHYROXINE SODIUM PO levothyroxine    [provider]  MAGNESIUM PO Take by mouth. Patient not taking: Reported on 09/20/2020    [provider]  metFORMIN (GLUCOPHAGE) 1000 MG tablet metformin 1,000 mg tablet  Take 1 tablet twice a day by oral route. Patient not taking: Reported on 09/20/2020    [provider]  metFORMIN (GLUCOPHAGE-XR) 500 MG 24 hr tablet Take 1,000 mg by mouth 2 (two) times daily.    [provider]  metoprolol succinate (TOPROL-XL) 50 MG 24 hr tablet Take 50 mg by mouth in the morning and at bedtime. Take with or immediately following a meal.  Patient not taking: No sig reported    [provider]  niacin (NIASPAN) 500 MG CR tablet Take 500 mg by mouth daily. Patient not taking: Reported on 09/20/2020    [provider]  niacin (NIASPAN) 500 MG CR tablet Take by mouth. Patient not taking: Reported on 09/20/2020    [provider]  NIACIN CR PO Niacin CR 400 mg capsule,extended release  Take by oral route. Patient not taking: Reported on 09/20/2020    [provider]  olmesartan (BENICAR) 20 MG tablet Take 20 mg by mouth 2 (two) times daily.  Patient not taking: Reported on 09/20/2020    [provider]  olmesartan (BENICAR) 20 MG tablet Take 2 tablets by mouth daily. 07/04/19   [provider]  pantoprazole (PROTONIX) 40 MG tablet Take 40 mg by mouth 2 (two) times daily before a meal.    [provider]  potassium chloride SA (KLOR-CON) 20 MEQ tablet Take 20 mEq by mouth 2 (two) times daily. 06/08/19   [provider]  topiramate (TOPAMAX) 25 MG  tablet Take 1 tablet (25 mg total) by mouth 2 (two) times daily. Patient not taking: No sig reported 08/02/19   Garvin Fila, MD  TRULICITY [id number] SOPN Inject 0.75 mg into the skin once a week. 05/19/19   [provider]  Zinc Methionate 50 MG CAPS Take by mouth.    [provider]   I have personally, briefly reviewed patient's prior medical records in [place]  Physical Exam: Vitals:   09/24/20 1930 09/24/20 2000 09/24/20 2030 09/24/20 2145  BP: 125/79 124/65 120/69 131/69  Pulse:    68  Resp: (!) 21 (!) 21 (!) 22 17  Temp:    98.3 F (36.8 C)  TempSrc:    Oral  SpO2:    97%  Weight:      Height:        Constitutional: NAD, calm, comfortable HEENT: lids and conjunctivae normal Mucous membranes are moist.  Neck: normal, supple, no masses, no thyromegaly Respiratory: CTAB, no wheezing, no crackles. Normal respiratory effort. No accessory muscle use.  Cardiovascular: irregular rhythm, regular rate. no murmurs / rubs / gallops. trace extremity edema. 2+ pedal pulses. no clubbing / cyanosis.  Abdomen: soft, NT, ND, no masses or HSM palpated. Musculoskeletal: No  joint deformity upper and lower extremities. Normal muscle tone.  Skin: dry, intact, normal color, normal temperature Neurologic: Alert and oriented x 3. Normal speech.  Grossly non-focal exam. PERRL CN II-XII intact. Only focal finding was abnormal sensation of palpation of lateral left foot.  Psychiatric: Normal mood. Congruent affect.  Normal judgement and insight.  Labs on Admission: I have personally reviewed following labs and imaging studies  CBC: Recent Labs  Lab 09/24/20 0620  WBC 8.2  NEUTROABS 4.2  HGB 11.6*  HCT 35.3*  MCV 86.9  PLT 0000000   Basic Metabolic Panel: Recent Labs  Lab 09/24/20 0620  NA 139  K 3.5  CL 103  CO2 28  GLUCOSE 112*  BUN 14  CREATININE 0.96  CALCIUM 8.9   GFR: Estimated Creatinine Clearance: 67.7 mL/min (by C-G formula based on SCr of 0.96 mg/dL). Liver Function Tests: Recent Labs  Lab 09/24/20 0620  AST 15  ALT 12  ALKPHOS 107  BILITOT 0.3  PROT 7.0  ALBUMIN 3.6   Recent Labs  Lab 09/24/20 0620  LIPASE 35   No results for input(s): AMMONIA in the last 168 hours. Coagulation Profile: Recent Labs  Lab 09/24/20 0634  INR 1.0   CBG: Recent Labs  Lab 09/24/20 0615 09/24/20 1221  GLUCAP 93 88   Urine analysis:    Component Value Date/Time   COLORURINE STRAW (A) 09/24/2020 0755   APPEARANCEUR CLEAR 09/24/2020 0755   LABSPEC 1.005 09/24/2020 0755   PHURINE 6.0 09/24/2020 0755   GLUCOSEU NEGATIVE 09/24/2020 0755   HGBUR NEGATIVE 09/24/2020 0755   BILIRUBINUR NEGATIVE 09/24/2020 0755   [place] 09/24/2020 0755   PROTEINUR NEGATIVE 09/24/2020 0755   NITRITE NEGATIVE 09/24/2020 0755   LEUKOCYTESUR NEGATIVE 09/24/2020 0755    Radiological Exams on Admission: CT Angio Head W or Wo Contrast  Addendum Date: 09/24/2020   ADDENDUM REPORT: 09/24/2020 07:58 ADDENDUM: Study discussed by telephone with Dr. Armandina Gemma on 09/24/2020 at 0727 hours. Electronically Signed   By: Genevie Ann M.D.   On: 09/24/2020 07:58   Result  Date: 09/24/2020 CLINICAL DATA:  65 year old female code stroke presentation. EXAM: CT ANGIOGRAPHY HEAD AND NECK TECHNIQUE: Multidetector CT imaging of the head and neck was performed using the  standard protocol during bolus administration of intravenous contrast. Multiplanar CT image reconstructions and MIPs were obtained to evaluate the vascular anatomy. Carotid stenosis measurements (when applicable) are obtained utilizing NASCET criteria, using the distal internal carotid diameter as the denominator. CONTRAST:  128m OMNIPAQUE IOHEXOL 350 MG/ML SOLN COMPARISON:  Plain head CT 0646 hours today. FINDINGS: CTA NECK Skeleton: No acute osseous abnormality identified. Cervical spine disc and endplate degeneration. Upper chest: Negative. Other neck: Diminutive or absent thyroid. Effaced oropharynx appears physiologic. Negative other visible neck soft tissues. Aortic arch: 3 vessel arch configuration. Minimal arch atherosclerosis. Right carotid system: Negative. Left carotid system: Negative. Vertebral arteries: Proximal right subclavian artery and right vertebral artery origin are normal. Right vertebral is patent and normal to the skull base. Mild plaque at the proximal left subclavian artery without stenosis. Normal left vertebral artery origin. Tortuous left vertebral artery is patent to the skull base without stenosis. CTA HEAD Posterior circulation: Mildly dominant right V4 segment. Patent distal vertebral arteries to the basilar without plaque or stenosis. Both AICAs appear dominant and patent. Patent basilar artery without stenosis. Patent SCA and PCA origins. Posterior communicating arteries are diminutive or absent. Bilateral PCA branches are within normal limits. Anterior circulation: Both ICA siphons are patent. There is mild calcified siphon plaque and tortuosity bilaterally without stenosis. Patent carotid termini. Patent MCA and ACA origins. Diminutive or absent anterior communicating artery. Bilateral  ACA branches are within normal limits. Left MCA M1 segment and bifurcation are patent without stenosis. Right MCA M1 segment and trifurcation are patent without stenosis. Bilateral MCA branches are within normal limits. Venous sinuses: Patent and within normal limits. Right transverse/sigmoid sinus junction arachnoid granulation (normal). Anatomic variants: Mildly dominant right vertebral artery. Review of the MIP images confirms the above findings IMPRESSION: 1. Largely negative CTA Head and Neck. No large vessel occlusion. No atherosclerosis in the neck. Mild ICA siphon atherosclerosis without stenosis. 2. Diminutive or absent thyroid. Electronically Signed: By: HGenevie AnnM.D. On: 09/24/2020 07:24   CT Angio Neck W and/or Wo Contrast  Addendum Date: 09/24/2020   ADDENDUM REPORT: 09/24/2020 07:58 ADDENDUM: Study discussed by telephone with Dr. LArmandina Gemmaon 09/24/2020 at 0727 hours. Electronically Signed   By: HGenevie AnnM.D.   On: 09/24/2020 07:58   Result Date: 09/24/2020 CLINICAL DATA:  65year old female code stroke presentation. EXAM: CT ANGIOGRAPHY HEAD AND NECK TECHNIQUE: Multidetector CT imaging of the head and neck was performed using the standard protocol during bolus administration of intravenous contrast. Multiplanar CT image reconstructions and MIPs were obtained to evaluate the vascular anatomy. Carotid stenosis measurements (when applicable) are obtained utilizing NASCET criteria, using the distal internal carotid diameter as the denominator. CONTRAST:  1036mOMNIPAQUE IOHEXOL 350 MG/ML SOLN COMPARISON:  Plain head CT 0646 hours today. FINDINGS: CTA NECK Skeleton: No acute osseous abnormality identified. Cervical spine disc and endplate degeneration. Upper chest: Negative. Other neck: Diminutive or absent thyroid. Effaced oropharynx appears physiologic. Negative other visible neck soft tissues. Aortic arch: 3 vessel arch configuration. Minimal arch atherosclerosis. Right carotid system: Negative. Left  carotid system: Negative. Vertebral arteries: Proximal right subclavian artery and right vertebral artery origin are normal. Right vertebral is patent and normal to the skull base. Mild plaque at the proximal left subclavian artery without stenosis. Normal left vertebral artery origin. Tortuous left vertebral artery is patent to the skull base without stenosis. CTA HEAD Posterior circulation: Mildly dominant right V4 segment. Patent distal vertebral arteries to the basilar without plaque or stenosis. Both AICAs  appear dominant and patent. Patent basilar artery without stenosis. Patent SCA and PCA origins. Posterior communicating arteries are diminutive or absent. Bilateral PCA branches are within normal limits. Anterior circulation: Both ICA siphons are patent. There is mild calcified siphon plaque and tortuosity bilaterally without stenosis. Patent carotid termini. Patent MCA and ACA origins. Diminutive or absent anterior communicating artery. Bilateral ACA branches are within normal limits. Left MCA M1 segment and bifurcation are patent without stenosis. Right MCA M1 segment and trifurcation are patent without stenosis. Bilateral MCA branches are within normal limits. Venous sinuses: Patent and within normal limits. Right transverse/sigmoid sinus junction arachnoid granulation (normal). Anatomic variants: Mildly dominant right vertebral artery. Review of the MIP images confirms the above findings IMPRESSION: 1. Largely negative CTA Head and Neck. No large vessel occlusion. No atherosclerosis in the neck. Mild ICA siphon atherosclerosis without stenosis. 2. Diminutive or absent thyroid. Electronically Signed: By: Genevie Ann M.D. On: 09/24/2020 07:24   CT HEAD CODE STROKE WO CONTRAST  Addendum Date: 09/24/2020   ADDENDUM REPORT: 09/24/2020 07:09 ADDENDUM: Study discussed by telephone with Dr. Gerlene Fee on 09/24/2020 at 0701 hours. Electronically Signed   By: Genevie Ann M.D.   On: 09/24/2020 07:09   Result Date:  09/24/2020 CLINICAL DATA:  Code stroke.  65 year old female EXAM: CT HEAD WITHOUT CONTRAST TECHNIQUE: Contiguous axial images were obtained from the base of the skull through the vertex without intravenous contrast. COMPARISON:  Brain MRI 07/26/2019.  Head CT 06/23/2019. FINDINGS: Brain: Cerebral volume remains normal for age. No midline shift, ventriculomegaly, mass effect, evidence of mass lesion, intracranial hemorrhage or evidence of cortically based acute infarction. Gray-white matter differentiation is stable since last year, with mild for age scattered mostly subcortical white matter hypodensity. Vascular: Mild Calcified atherosclerosis at the skull base. No suspicious intracranial vascular hyperdensity. Skull: Stable, negative. Sinuses/Orbits: Visualized paranasal sinuses and mastoids are stable and well aerated. Other: Visualized orbits and scalp soft tissues are within normal limits. ASPECTS [facility] Stroke Program Early CT Score) Total score (0-10 with 10 being normal): 10 IMPRESSION: 1. No acute cortically based infarct or acute intracranial hemorrhage identified. ASPECTS 10. 2. Mild for age nonspecific cerebral white matter changes. Electronically Signed: By: Genevie Ann M.D. On: 09/24/2020 06:53    EKG: Independently reviewed. NSR with PACs, no ST changes.   Assessment/Plan Active Problems:   Dizziness   AMS (altered mental status)   Vertigo- hx/px most consistent with benign vertigo and/or migraine but patient has high risk factors for ischemic event including DM, HLD, HTN, h/o TIA. Head CT, CTA neg for acute changes/ischemia. Overall normal neuro exam. With her presenting symptoms including diaphoresis with N/V, I am going to send troponins. - f/u neuro recommendations - f/u troponin - telemetry - tylenol PRN for headache - ASA, plavix  DM- blood sugar 112 on presentation. A1c in 04/2020 was 6.2 - monitor blood sugar with daily labs and continue home medications - continue statin  HTN-  well controlled - continue home medications  DVT prophylaxis: enoxaparin (LOVENOX) injection 40 mg Start: 09/24/20 2245   Code Status: full  Family Communication: husband at bedside  Disposition Plan: pending neuro evaluation  Consults called: neurology   Richarda Osmond, DO [facility]  09/24/2020, 9:57 PM    To contact the appropriate [facility] Attending or Consulting provider: Check the care team in [facility] for a) attending/consulting [facility] provider name and b) the [facility] team listed Log into [email or web address] and use [place]'s universal password to access. If  you do not have the password, please contact the hospital operator. Locate the [facility] provider you are looking for under [facility] and page to a number that you can be directly reached. (Text pages appreciated) If you still have difficulty reaching the provider, please page the [facility] (Director on Call) for the Hospitalists listed on amion for assistance.

## 2020-09-24 NOTE — ED Provider Notes (Addendum)
   MDM  65 year old female presenting with vertigo, sudden onset this morning around 0430 am. Pronator drift present. Teleneuro for code stroke.   0730 Telemetry neuro evaluated the patient and the patient is NIH stroke score of 1.  Low concern for acute stroke at this time.  Radiology called as well and the patient CTA results negative.  Telemetry neuro did say that the patient is currently on aspirin, would recommend adding Plavix to the patient's home regimen.  Do not recommend a load. '75mg'$  Plavix ordered.  [facility] note states to admit to stroke telemetry floor.  I spoke with Dr. Lynnae Sandhoff of neurology at [facility] who clarified and recommended admission to hospitalist service.  Neurology will follow while admitted.  Hospitalist medicine consulted for admission.         Regan Lemming, MD 09/24/20 [phone]

## 2020-09-24 NOTE — ED Triage Notes (Signed)
Pt reports she woke up this morning around 0430 and felt dizzy "the room was spinning" associated with emesis x 1. 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 Hx of migraines - "it feels like a tightness around my head." Dizziness worse when she opens her eyes.

## 2020-09-25 ENCOUNTER — Observation Stay (HOSPITAL_BASED_OUTPATIENT_CLINIC_OR_DEPARTMENT_OTHER): Payer: 59

## 2020-09-25 ENCOUNTER — Observation Stay (HOSPITAL_COMMUNITY): Payer: 59

## 2020-09-25 DIAGNOSIS — I10 Essential (primary) hypertension: Secondary | ICD-10-CM

## 2020-09-25 DIAGNOSIS — R42 Dizziness and giddiness: Secondary | ICD-10-CM | POA: Diagnosis not present

## 2020-09-25 LAB — CBC
HCT: 33.7 % — ABNORMAL LOW (ref 36.0–46.0)
Hemoglobin: 11.2 g/dL — ABNORMAL LOW (ref 12.0–15.0)
MCH: 28.8 pg (ref 26.0–34.0)
MCHC: 33.2 g/dL (ref 30.0–36.0)
MCV: 86.6 fL (ref 80.0–100.0)
Platelets: 272 10*3/uL (ref 150–400)
RBC: 3.89 MIL/uL (ref 3.87–5.11)
RDW: 14.7 % (ref 11.5–15.5)
WBC: 6.1 10*3/uL (ref 4.0–10.5)
nRBC: 0 % (ref 0.0–0.2)

## 2020-09-25 LAB — LIPID PANEL
Cholesterol: 113 mg/dL (ref 0–200)
HDL: 50 mg/dL (ref >40)
LDL Cholesterol: 53 mg/dL (ref 0–99)
Total CHOL/HDL Ratio: 2.3 RATIO
Triglycerides: 49 mg/dL (ref <150)
VLDL: 10 mg/dL (ref 0–40)

## 2020-09-25 LAB — ECHOCARDIOGRAM COMPLETE
AR max vel: 3.4 cm2
AV Area VTI: 3.49 cm2
AV Area mean vel: 3.43 cm2
AV Mean grad: 2 mmHg
AV Peak grad: 3.9 mmHg
Ao pk vel: 0.98 m/s
Area-P 1/2: 3.23 cm2
Height: 63 in
MV VTI: 3.12 cm2
S' Lateral: 2.6 cm
Weight: 3668.45 oz

## 2020-09-25 LAB — VITAMIN B12: Vitamin B-12: 819 pg/mL (ref 180–914)

## 2020-09-25 LAB — BASIC METABOLIC PANEL
Anion gap: 9 (ref 5–15)
BUN: 15 mg/dL (ref 8–23)
CO2: 27 mmol/L (ref 22–32)
Calcium: 9 mg/dL (ref 8.9–10.3)
Chloride: 101 mmol/L (ref 98–111)
Creatinine, Ser: 1 mg/dL (ref 0.44–1.00)
GFR, Estimated: >60 mL/min (ref >60)
Glucose, Bld: 120 mg/dL — ABNORMAL HIGH (ref 70–99)
Potassium: 3.2 mmol/L — ABNORMAL LOW (ref 3.5–5.1)
Sodium: 137 mmol/L (ref 135–145)

## 2020-09-25 LAB — TROPONIN I (HIGH SENSITIVITY): Troponin I (High Sensitivity): 3 ng/L (ref <18)

## 2020-09-25 LAB — HEMOGLOBIN A1C
Hgb A1c MFr Bld: 6.6 % — ABNORMAL HIGH (ref 4.8–5.6)
Mean Plasma Glucose: 142.72 mg/dL

## 2020-09-25 LAB — HIV ANTIBODY (ROUTINE TESTING W REFLEX): HIV Screen 4th Generation wRfx: NONREACTIVE

## 2020-09-25 LAB — MAGNESIUM: Magnesium: 1.4 mg/dL — ABNORMAL LOW (ref 1.7–2.4)

## 2020-09-25 IMAGING — MR MR HEAD W/O CM
6 of 10 series · 28 of 48 positions shown · non-contrast
Comparison: [DATE]

CLINICAL DATA: Neuro deficit, acute, stroke suspected

EXAM:
MRI HEAD WITHOUT CONTRAST
TECHNIQUE: Multiplanar, multiecho pulse sequences of the brain and surrounding
structures were obtained without intravenous contrast.

[Series 2: DWI · axial · 3.0mm · 0.94mm/px · z∈[-71,+63]mm · 9 of 92 slices shown (1 of 2)]
[im 1/92]
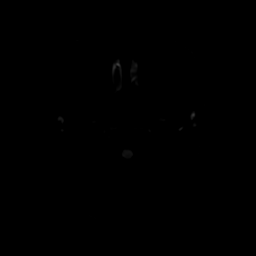
[im 12/92]
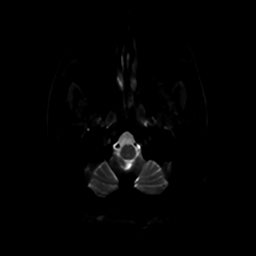
[im 23/92]
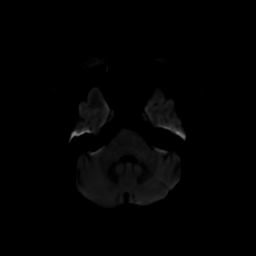
[im 35/92]
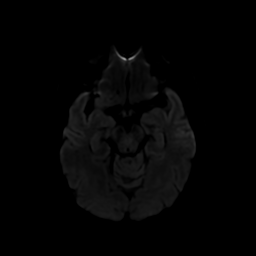
[im 46/92]
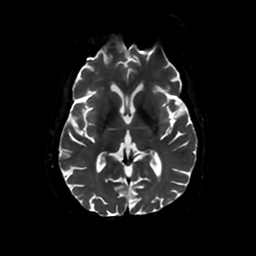
[im 57/92]
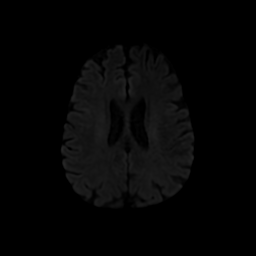
[im 69/92]
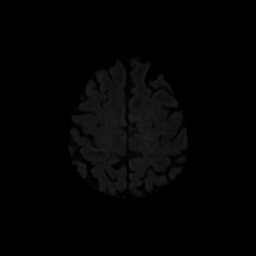
[im 80/92]
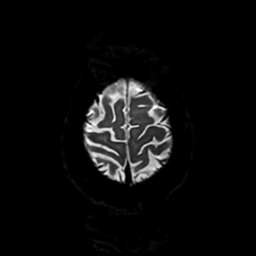
[im 92/92]
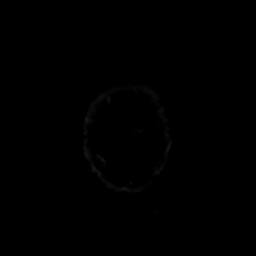

[Series 3: DWI · coronal · 4.0mm · 0.94mm/px · 7 of 70 slices shown (2 of 2)]
[im 1/70]
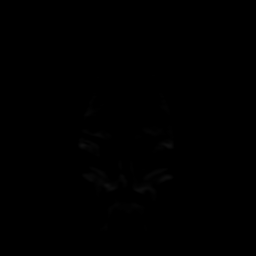
[im 12/70]
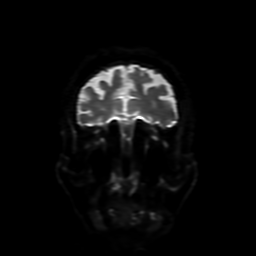
[im 24/70]
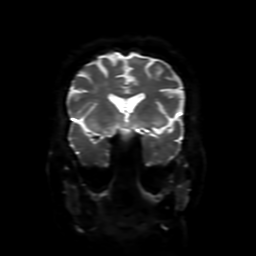
[im 35/70]
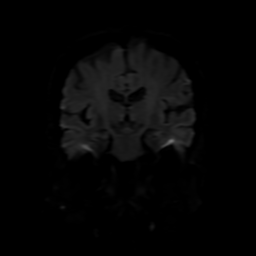
[im 47/70]
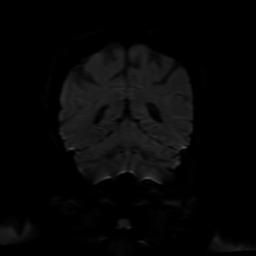
[im 58/70]
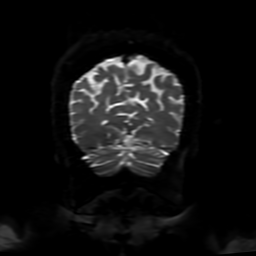
[im 70/70]
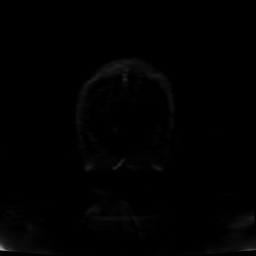

[Series 4: FLAIR · sagittal · 5.0mm · 0.23mm/px · 2 of 23 slices shown (1 of 2)]
[im 1/23]
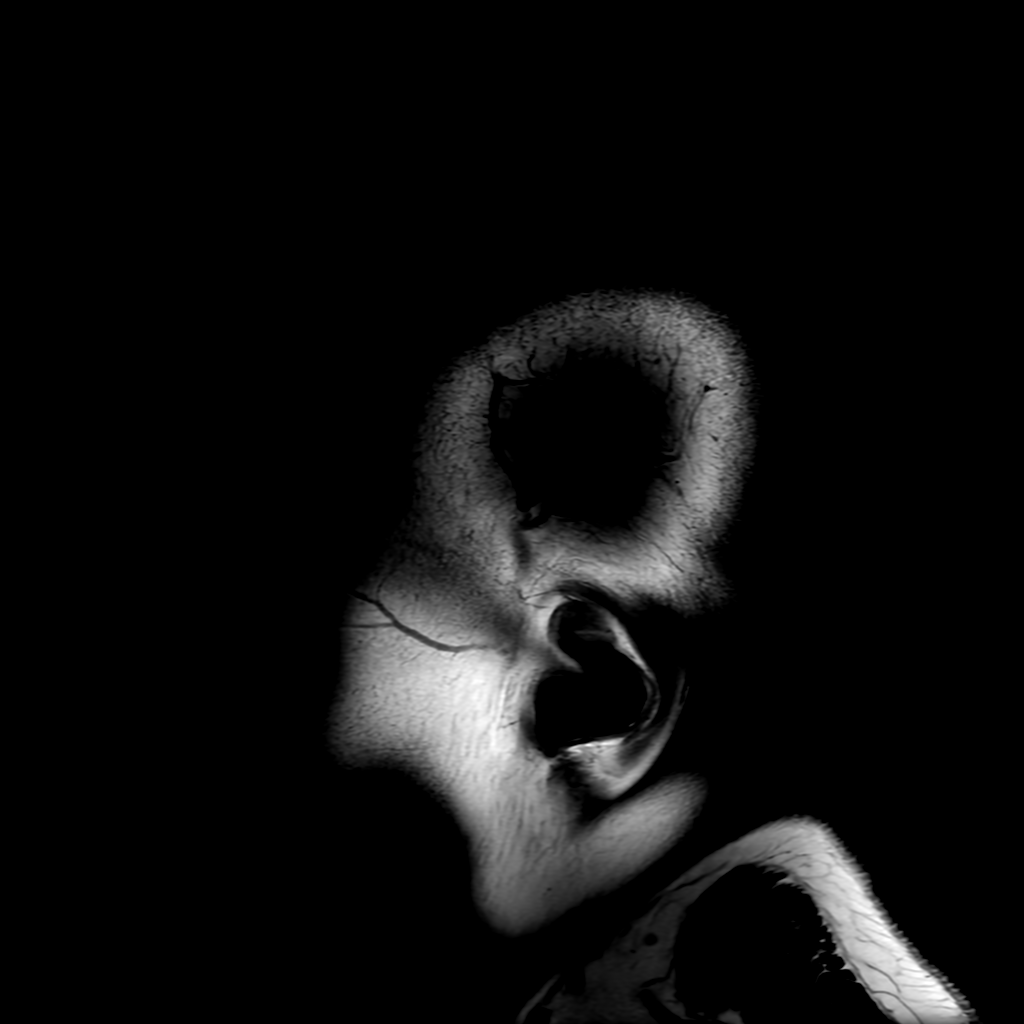
[im 23/23]
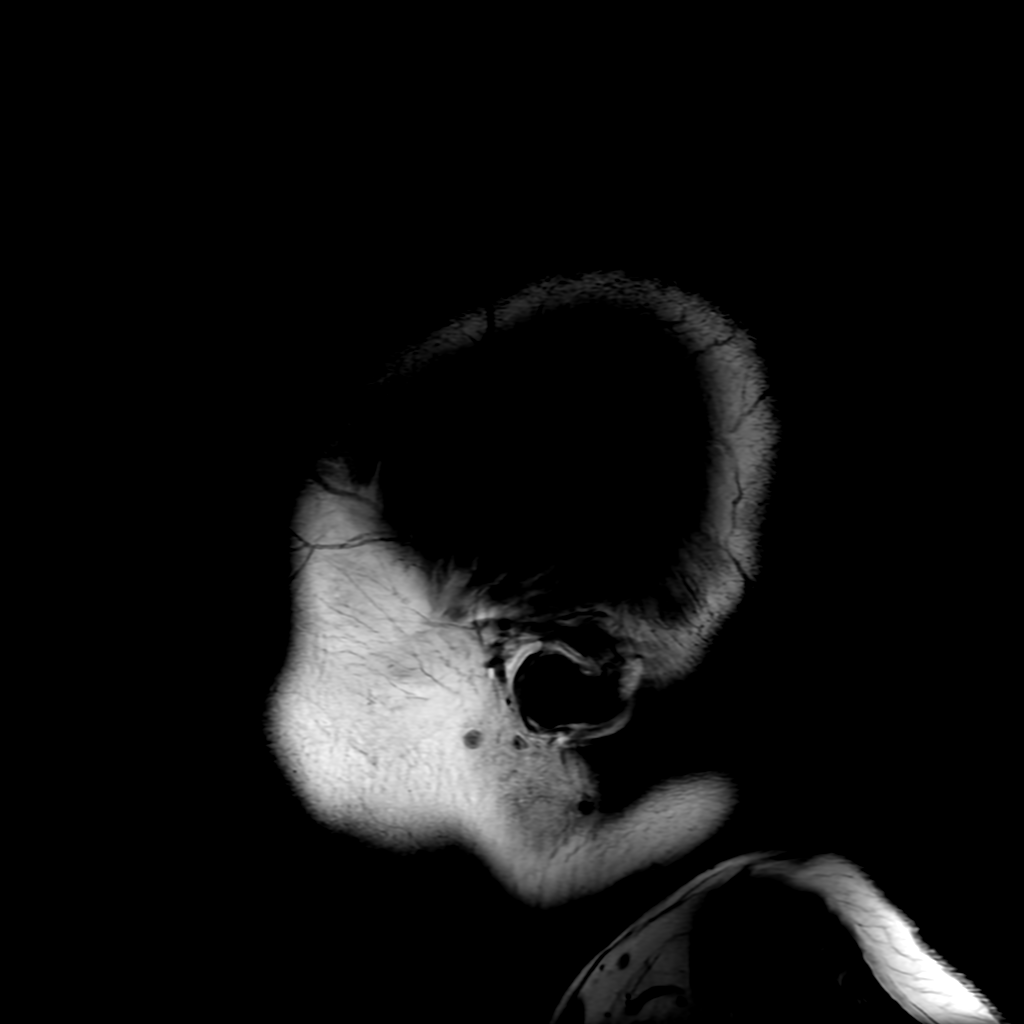

[Series 6: FLAIR · axial · 4.0mm · 0.45mm/px · z∈[-67,+60]mm · 3 of 30 slices shown (2 of 2)]
[im 1/30]
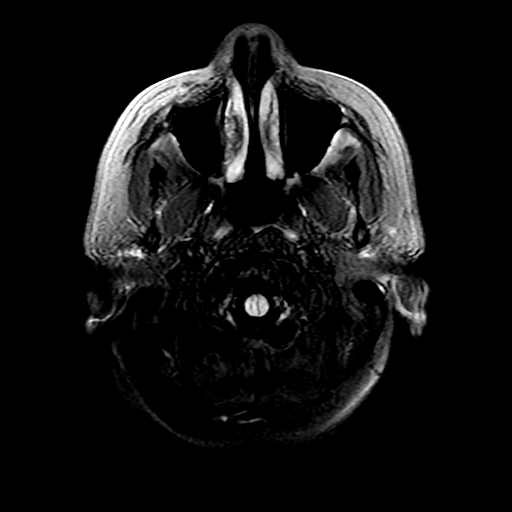
[im 15/30]
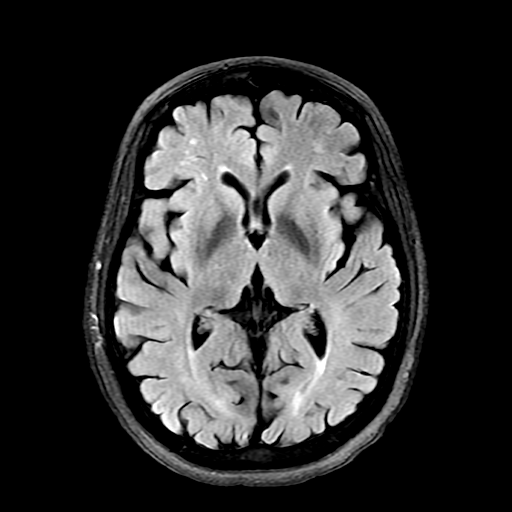
[im 30/30]
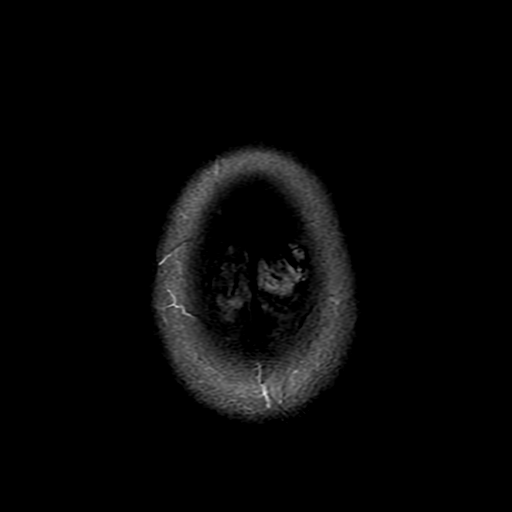

[Series 250: ADC · axial · 3.0mm · 0.94mm/px · z∈[-71,+63]mm · 4 of 46 slices shown (1 of 2)]
[im 1/46]
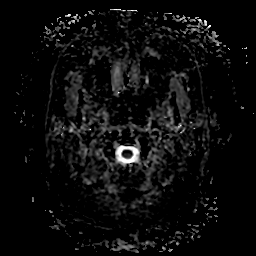
[im 16/46]
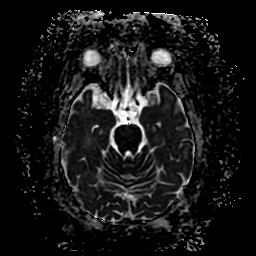
[im 31/46]
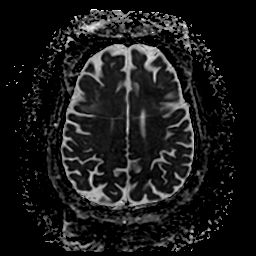
[im 46/46]
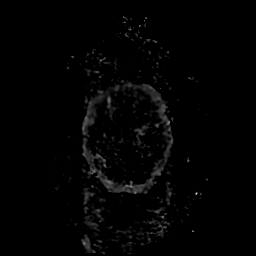

[Series 350: ADC · coronal · 4.0mm · 0.94mm/px · 3 of 33 slices shown (2 of 2)]
[im 1/33]
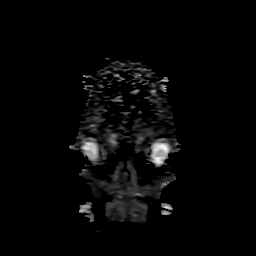
[im 17/33]
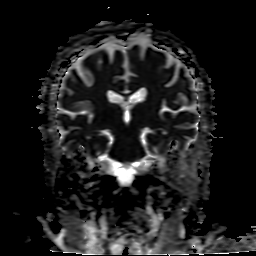
[im 33/33]
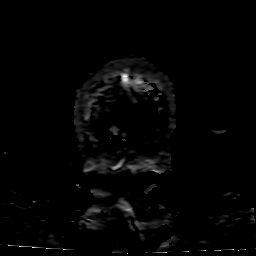

[28 of 48 positions shown; findings below may reference images not displayed]

FINDINGS: Brain: There is no acute infarction or intracranial hemorrhage.
There is no intracranial mass, mass effect, or edema. There is no
hydrocephalus or extra-axial fluid collection. Ventricles and sulci
are stable in size and configuration. Patchy T2 hyperintensity in
the supratentorial white matter is nonspecific but may reflect
stable chronic microvascular ischemic changes. A left cerebellar
developmental venous anomaly is incidentally noted.

Vascular: Major vessel flow voids at the skull base are preserved.

Skull and upper cervical spine: Normal marrow signal is preserved.

Sinuses/Orbits: Paranasal sinuses are aerated. Orbits are
unremarkable.

Other: Sella is unremarkable.  Mastoid air cells are clear.
IMPRESSION: No acute infarction, hemorrhage, or mass. Stable chronic
microvascular ischemic changes.

## 2020-09-25 IMAGING — MR MR CERVICAL SPINE W/O CM
4 of 5 series · 19 of 48 positions shown · non-contrast
Comparison: None.

CLINICAL DATA: Myelopathy, acute or progressive

EXAM:
MRI CERVICAL SPINE WITHOUT CONTRAST
TECHNIQUE: Multiplanar, multisequence MR imaging of the cervical spine was
performed. No intravenous contrast was administered.

[Series 3: T2 · sagittal · 3.0mm · 0.43mm/px · 5 of 16 slices shown (1 of 2)]
[im 1/16]
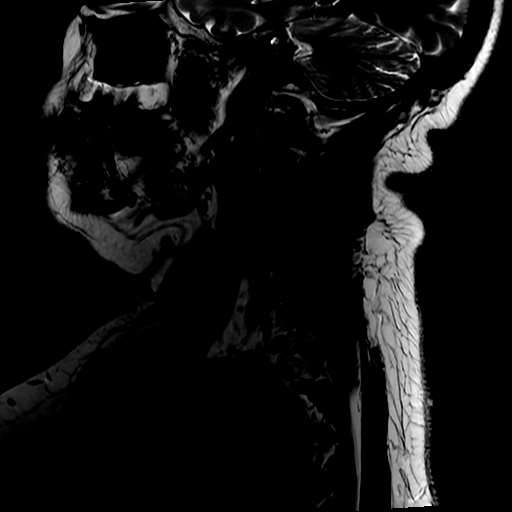
[im 4/16]
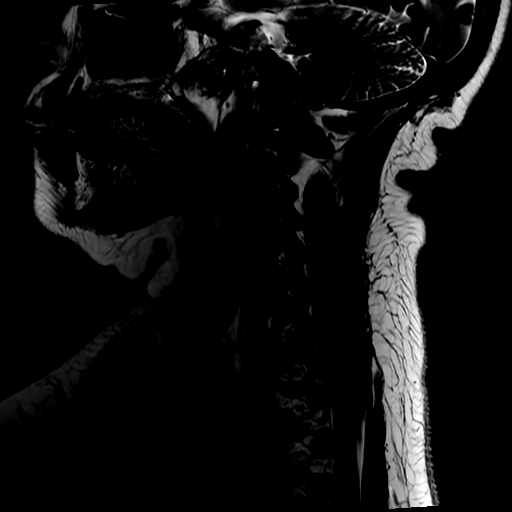
[im 8/16]
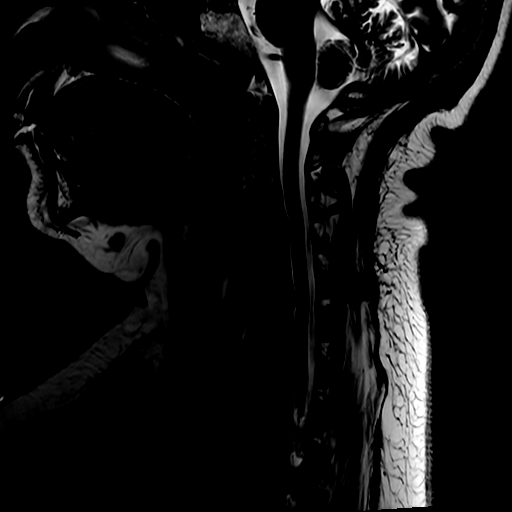
[im 12/16]
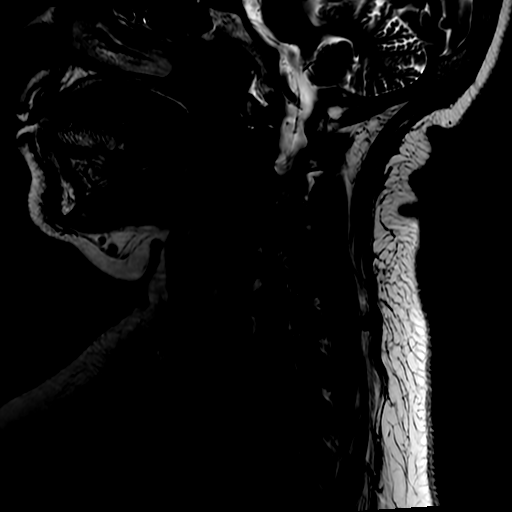
[im 16/16]
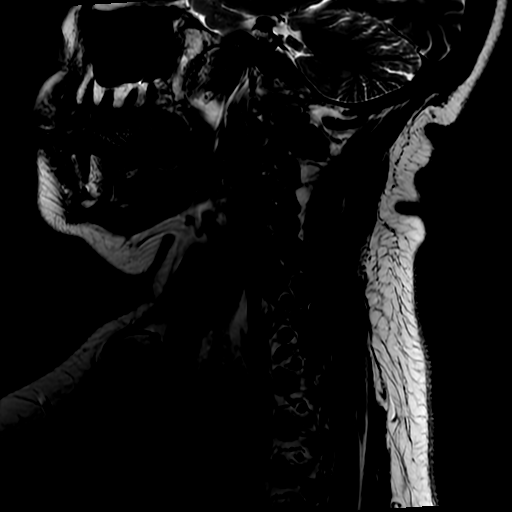

[Series 4: FLAIR · sagittal · 3.0mm · 0.43mm/px · 3 of 16 slices shown]
[im 1/16]
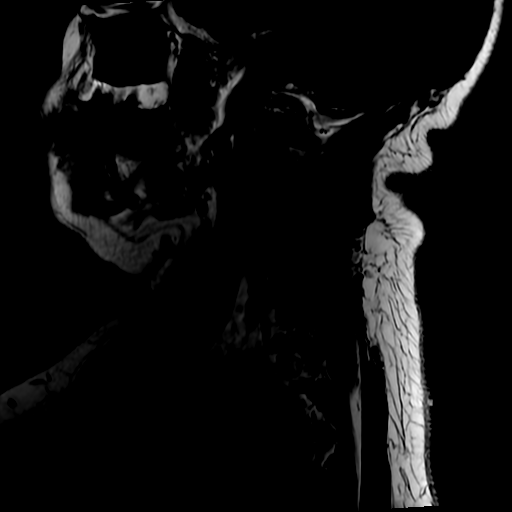
[im 8/16]
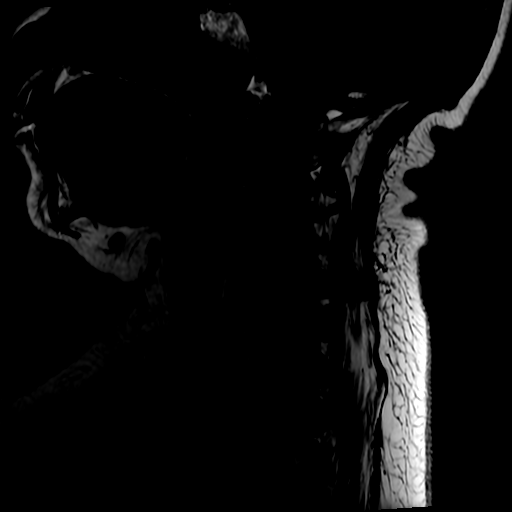
[im 16/16]
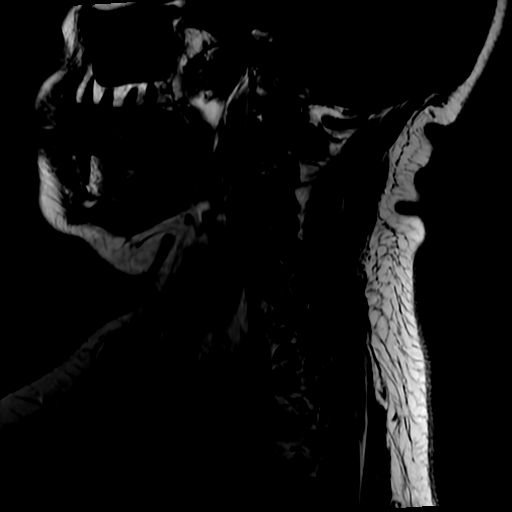

[Series 5: STIR · sagittal · 3.0mm · 0.43mm/px · 3 of 16 slices shown]
[im 1/16]
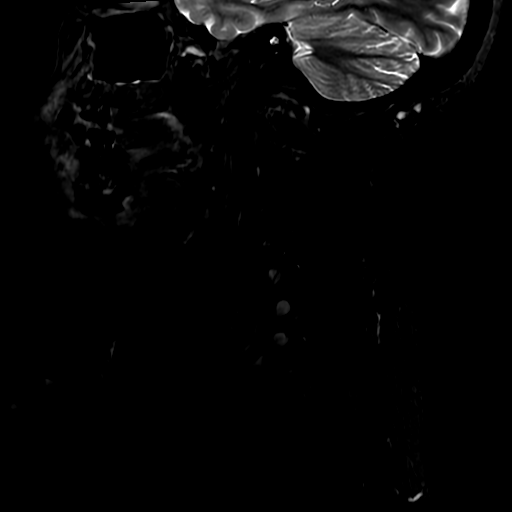
[im 8/16]
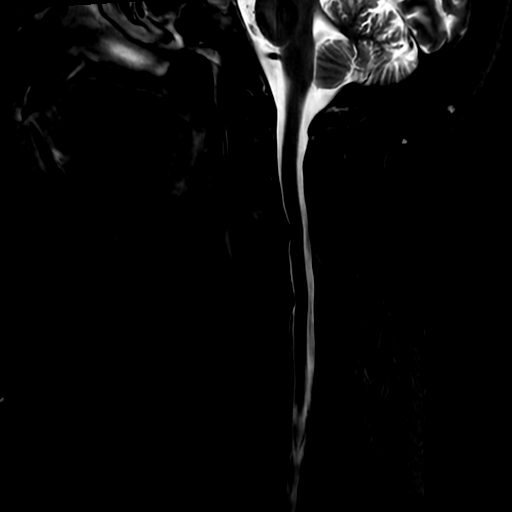
[im 16/16]
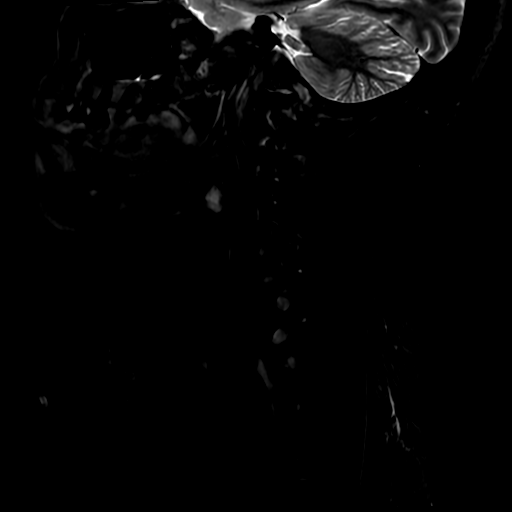

[Series 7: T2 · axial · 3.0mm · 0.35mm/px · z∈[-183,-112]mm · 8 of 27 slices shown (2 of 2)]
[im 1/27]
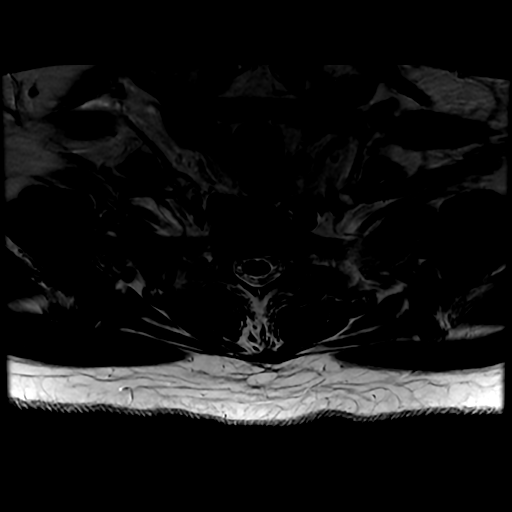
[im 4/27]
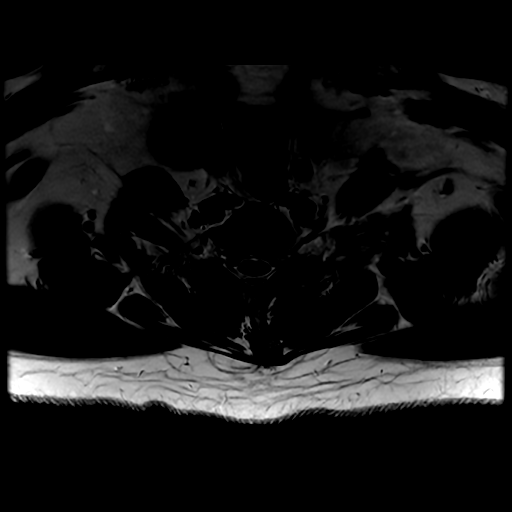
[im 7/27]
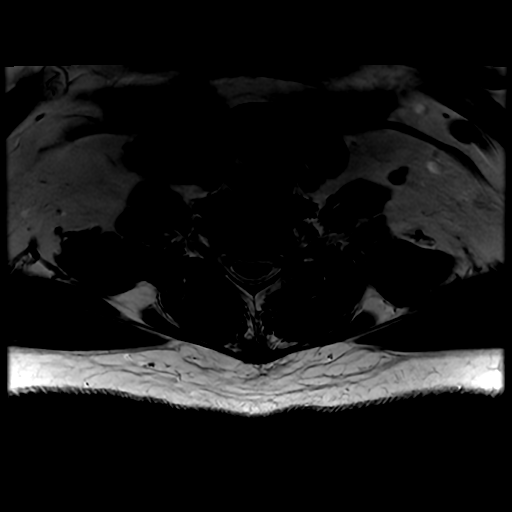
[im 10/27]
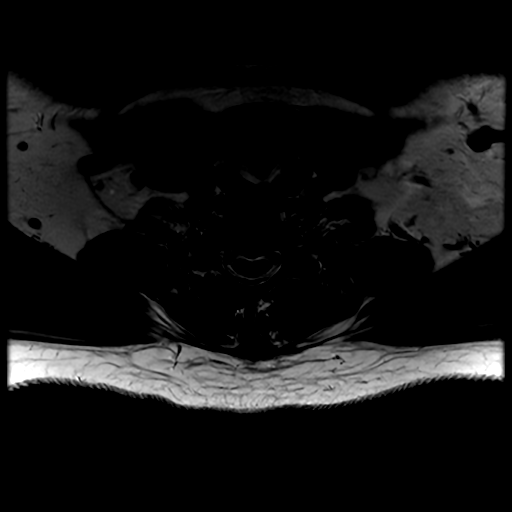
[im 14/27]
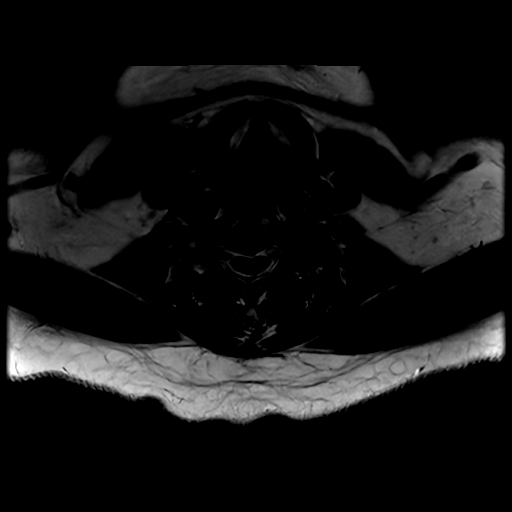
[im 17/27]
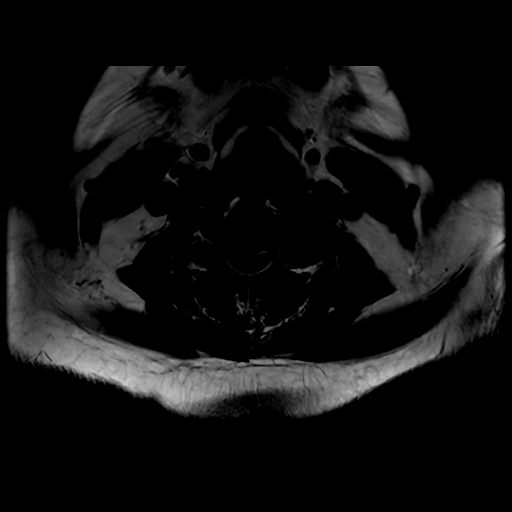
[im 20/27]
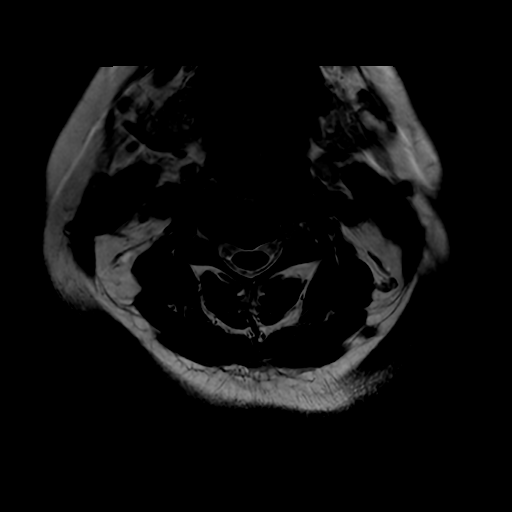
[im 23/27]
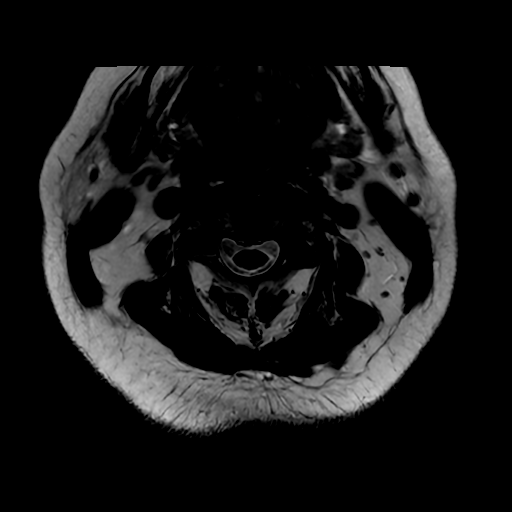

[19 of 48 positions shown; findings below may reference images not displayed]

FINDINGS: Alignment: Slight reversal of normal cervical lordosis.

Vertebrae: Vertebral body heights are maintained. No focal marrow
edema to suggest acute fracture or discitis/osteomyelitis. Mildly
heterogeneous bone marrow without suspicious bone lesion.

Cord: Normal cord signal.

Posterior Fossa, vertebral arteries, paraspinal tissues: Visualized
vertebral artery flow voids are maintained. No evidence of acute
abnormality in the visualized posterior fossa. Appreciable
paraspinal edema.

Disc levels:

C2-C3: Small central posterior disc osteophyte complex without
significant canal or foraminal stenosis.

C3-C4: Small central posterior disc osteophyte complex without
significant canal or foraminal stenosis.

C4-C5: Posterior disc osteophyte complex contacts and deforms the
right paracentral cord with overall mild canal stenosis. Left
greater than right facet and uncovertebral hypertrophy with moderate
left foraminal stenosis. No significant right foraminal stenosis.

C5-C6: Small central posterior disc osteophyte complex without
significant canal or foraminal stenosis.

C6-C7: Posterior disc osteophyte complex contacts and deforms the
left ventral cord with overall mild canal stenosis. No significant
foraminal stenosis.

C7-T1: Mild uncovertebral hypertrophy without significant canal or
foraminal stenosis.
IMPRESSION: 1. Posterior disc osteophyte complexes at C4-C5 and C6-C7 contact
and deform the ventral cord (more so at C4-C5) with overall mild
canal stenosis at these levels. No appreciable cord edema.
2. Moderate left foraminal stenosis at C4-C5.

## 2020-09-25 MED ORDER — STROKE: EARLY STAGES OF RECOVERY BOOK
Freq: Once | Status: AC
  Filled 2020-09-25: qty 1

## 2020-09-25 MED ORDER — ATORVASTATIN CALCIUM 40 MG PO TABS
40.0000 mg | ORAL_TABLET | Freq: Every day | ORAL | Status: DC
  Administered 2020-09-25: 40 mg via ORAL
  Filled 2020-09-25: qty 1

## 2020-09-25 MED ORDER — MAGNESIUM SULFATE 2 GM/50ML IV SOLN
2.0000 g | Freq: Once | INTRAVENOUS | Status: AC
  Administered 2020-09-25: 2 g via INTRAVENOUS
  Filled 2020-09-25: qty 50

## 2020-09-25 MED ORDER — METFORMIN HCL ER 500 MG PO TB24: 1000.0000 mg | ORAL_TABLET | Freq: Two times a day (BID) | ORAL | Status: AC

## 2020-09-25 MED ORDER — POTASSIUM CHLORIDE CRYS ER 20 MEQ PO TBCR
20.0000 meq | EXTENDED_RELEASE_TABLET | Freq: Once | ORAL | Status: AC
  Administered 2020-09-25: 20 meq via ORAL
  Filled 2020-09-25: qty 1

## 2020-09-25 MED ORDER — PERFLUTREN LIPID MICROSPHERE
1.0000 mL | INTRAVENOUS | Status: AC | PRN
  Filled 2020-09-25: qty 10

## 2020-09-25 NOTE — Discharge Instructions (Signed)
Follow with Primary MD [name], MD in 7 days   Get CBC, CMP,  checked  by Primary MD next visit.    Activity: As tolerated with Full fall precautions use walker/cane & assistance as needed   Disposition Home    Diet: Heart Healthy  . On your next visit with your primary care physician please Get Medicines reviewed and adjusted.   Please request your Prim.MD to go over all Hospital Tests and Procedure/Radiological results at the follow up, please get all Hospital records sent to your Prim MD by signing hospital release before you go home.   If you experience worsening of your admission symptoms, develop shortness of breath, life threatening emergency, suicidal or homicidal thoughts you must seek medical attention immediately by calling 911 or calling your MD immediately  if symptoms less severe.  You Must read complete instructions/literature along with all the possible adverse reactions/side effects for all the Medicines you take and that have been prescribed to you. Take any new Medicines after you have completely understood and accpet all the possible adverse reactions/side effects.   Do not drive when taking Pain medications.    Do not take more than prescribed Pain, Sleep and Anxiety Medications  Special Instructions: If you have smoked or chewed Tobacco  in the last 2 yrs please stop smoking, stop any regular Alcohol  and or any Recreational drug use.  Wear Seat belts while driving.   Please note  You were cared for by a hospitalist during your hospital stay. If you have any questions about your discharge medications or the care you received while you were in the hospital after you are discharged, you can call the unit and asked to speak with the hospitalist on call if the hospitalist that took care of you is not available. Once you are discharged, your primary care physician will handle any further medical issues. Please note that NO REFILLS for any discharge medications  will be authorized once you are discharged, as it is imperative that you return to your primary care physician (or establish a relationship with a primary care physician if you do not have one) for your aftercare needs so that they can reassess your need for medications and monitor your lab values.

## 2020-09-25 NOTE — Plan of Care (Signed)

## 2020-09-25 NOTE — Progress Notes (Signed)
SLP Cancellation Note  Patient Details Name: Terri Waters MRN: [id number] DOB: [birth year]   Cancelled treatment:       Reason Eval/Treat Not Completed: SLP screened, no needs identified, will sign off; no cognitive linguistic needs exhibited, MRI of head also unremarkable.    [place], CCC-SLP Acute Rehabilitation Services   09/25/2020, 2:11 PM

## 2020-09-25 NOTE — TOC Initial Note (Signed)
Transition of Care [facility]) - Initial/Assessment Note    Patient Details  Name: Terri Waters MRN: [id number] Date of Birth: Aug 22, 1955  [facility]) CM/SW Contact:    Carles Collet, RN Phone Number: 09/25/2020, 3:38 PM  Clinical Narrative:        Damaris Schooner w patient at bedside. Discussed PT recs. She confirms that she is active at [facility] for [place] PT services. Referral send through Epic for vestibular PT there.  Patient denies need for DME. No other CM needs at this time.             Expected Discharge Plan: Home/Self Care Barriers to Discharge: Continued Medical Work up   Patient Goals and CMS Choice Patient states their goals for this hospitalization and ongoing recovery are:: to go home      Expected Discharge Plan and Services Expected Discharge Plan: Home/Self Care   Discharge Planning Services: CM Consult Post Acute Care Choice: Resumption of Svcs/PTA Provider                                        Prior Living Arrangements/Services                       Activities of Daily Living Home Assistive Devices/Equipment: None ADL Screening (condition at time of admission) Patient's cognitive ability adequate to safely complete daily activities?: Yes Is the patient deaf or have difficulty hearing?: No Does the patient have difficulty seeing, even when wearing glasses/contacts?: No Does the patient have difficulty concentrating, remembering, or making decisions?: No Patient able to express need for assistance with ADLs?: Yes Does the patient have difficulty dressing or bathing?: No Independently performs ADLs?: Yes (appropriate for developmental age) Does the patient have difficulty walking or climbing stairs?: No Weakness of Legs: Left Weakness of Arms/Hands: None  Permission Sought/Granted                  Emotional Assessment              Admission diagnosis:  Dizziness [R42] Vertigo [R42] AMS (altered mental  status) [R41.82] Patient Active Problem List   Diagnosis Date Noted   Dizziness 09/24/2020   AMS (altered mental status) 09/24/2020   Cerebellar infarct ([place]) 06/23/2019   Status post total knee replacement, 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

## 2020-09-25 NOTE — Evaluation (Signed)
Occupational Therapy Evaluation Patient Details Name: Terri Waters MRN: [id number] DOB: [birth year] Today's Date: 09/25/2020   History of Present Illness Terri Waters is a 65 y.o. female presenting to ED on 09/24/20 with several hours of dizziness, nausea, diaphoresis which is similar to the episodes she had in June and was diagnosed with BPPV which resolved with PT PMH: non-insulin dependent DM, HLD, hypothyroidism s/p papillary ca, complex migraines BPPV   Clinical Impression   Pt received in recliner, agreeable to OT evaluation. Pt reports dizziness has resolved since PT evaluation this morning. Pt with c/o frontal headache, BP 136/70. Pt ambulated in room to complete ADL at modified independent level with slightly guarded walking. Pt reports feeling close to her baseline except for HA and "uneasy feeling", referring to not knowing reason for symptoms. Pt with no acute needs identified at this time. All education complete. No additional OT needs identified at this time. OT to sign off. Please re-order should pt have a change in status.       Recommendations for follow up therapy are one component of a multi-disciplinary discharge planning process, led by the attending physician.  Recommendations may be updated based on patient status, additional functional criteria and insurance authorization.   Follow Up Recommendations  No OT follow up    Equipment Recommendations  None recommended by OT    Recommendations for Other Services       Precautions / Restrictions Precautions Precautions: Fall Precaution Comments: pt with R posterior canal BPPV Restrictions Weight Bearing Restrictions: No      Mobility Bed Mobility               General bed mobility comments: pt received in and returned to recliner    Transfers Overall transfer level: Modified independent Equipment used: None                  Balance Overall balance assessment: Modified Independent                                          ADL either performed or assessed with clinical judgement   ADL Overall ADL's : Modified independent                                       General ADL Comments: pt completed in room mobility reaching outside base of support to grab items, no loss of balance noted. Pt completed grooming while standing at sink level     Vision Baseline Vision/History: 1 Wears glasses Ability to See in Adequate Light: 1 Impaired Patient Visual Report: No change from baseline Vision Assessment?: No apparent visual deficits (assessment complete pt WNL for all aspects) Additional Comments: pt wears glasses at all times     Perception     Praxis      Pertinent Vitals/Pain Pain Assessment: 0-10 Pain Score: 4  Pain Location: frontal Pain Descriptors / Indicators: Headache Pain Intervention(s): Monitored during session     Hand Dominance Right   Extremity/Trunk Assessment Upper Extremity Assessment Upper Extremity Assessment: Overall WFL for tasks assessed   Lower Extremity Assessment Lower Extremity Assessment: LLE deficits/detail LLE Deficits / Details: grossly 4/5 see PT eval for further information   Cervical / Trunk Assessment Cervical / Trunk Assessment: Normal   Communication Communication Communication: No  difficulties   Cognition Arousal/Alertness: Awake/alert Behavior During Therapy: WFL for tasks assessed/performed Overall Cognitive Status: Within Functional Limits for tasks assessed                                     General Comments  pt reports dizziness has resolved since PT evaluation    Exercises     Shoulder Instructions      Home Living Family/patient expects to be discharged to:: Private residence Living Arrangements: Spouse/significant other (and grandson) Available Help at Discharge: Family;Available 24 hours/day (grandson works, spouse retired but in/out, pt works from home, family can  provide 24/7) Type of Home: [place] Access: Stairs to enter Technical brewer of Steps: 5 Entrance Stairs-Rails: Right;Left;Can reach both [place]: Two level Alternate Level Stairs-Number of Steps: flight Alternate Level Stairs-Rails: Right Bathroom Shower/Tub: Occupational psychologist: Handicapped height     Home Equipment: Radio producer - single point          Prior Functioning/Environment Level of Independence: Independent        Comments: driving, 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 [employer]        OT Problem List: Pain      OT Treatment/Interventions:      OT Goals(Current goals can be found in the care plan section) Acute Rehab OT Goals Patient Stated Goal: stop the headache OT Goal Formulation: With patient Time For Goal Achievement: 10/02/20 Potential to Achieve Goals: Good  OT Frequency:     Barriers to D/C:            Co-evaluation              AM-PAC OT "6 Clicks" Daily Activity     Outcome Measure Help from another person eating meals?: None Help from another person taking care of personal grooming?: None Help from another person toileting, which includes using toliet, bedpan, or urinal?: None Help from another person bathing (including washing, rinsing, drying)?: None Help from another person to put on and taking off regular upper body clothing?: None Help from another person to put on and taking off regular lower body clothing?: None 6 Click Score: 24   End of Session Nurse Communication: Mobility status  Activity Tolerance: Patient tolerated treatment well Patient left: in chair;with call bell/phone within reach;with family/visitor present  OT Visit Diagnosis: BPPV BPPV - Right/Left: Right                Time: [id number] OT Time Calculation (min): 14 min Charges:  OT General Charges $OT Visit: 1 Visit OT Evaluation $OT Eval Low Complexity: 1 Low  Virdell Hoiland OTR/L Acute Rehabilitation Services Office:  [place] 09/25/2020, 11:08 AM

## 2020-09-25 NOTE — Discharge Summary (Signed)
Physician Discharge Summary  KADASHA MALL [id number] DOB: 09-09-1955 DOA: 09/24/2020  PCP: [name], MD  Admit date: 09/24/2020 Discharge date: 09/25/2020  Admitted From: Home Disposition:  Home   Recommendations for Outpatient Follow-up:  Follow up with PCP in 1-2 weeks To follow  with neurosurgery as an outpatient  Home Health: Outpatient vestibular PT, referral has been sent electronically.  Discharge Condition:stable CODE STATUS:FULL Diet recommendation: Heart Healthy / Carb Modified   Brief/Interim Summary:  Terri Waters is a 65 y.o. female with a PMH significant for non-insulin dependent DM, HLD, hypothyroidism s/p papillary ca, complex migraines, possible h/o TIA vs benign paroxysmal vertigo. They presented from home to [facility] on 09/24/2020 with several hours of dizziness, nausea, diaphoresis which is similar to the episodes she had last June and was diagnosed with benign paroxysmal vertigo which resolved with PT. She states that she had a headache when she went to bed last night and when she got up this morning, she was having positional vertigo with nausea/vomiting. In the ED, it was found that they had no LVO on imaging.  They were treated with aspirin, plavix.  Currently, she has no dizziness at baseline but present when she moves positions quickly and also a headache in right occipital area which is typical for her.  She was seen a couple months ago by ophthalmology and said she did not need new prescription but she has been staring at screens a lot more lately and having increased frequency of headaches. She typically takes tylenol or amitriptyline. She took only her regular medications this morning.  Denies chest pain or SOB.  Patient was admitted to medicine service for further workup and management of vertigo as outlined in detail below.    Vertigo -Findings are suspicious for BPPV, MRI brain with no acute ischemic event - patient was seen by PT during hospital  stay, symptoms significantly improved, she will need to continue vestibular PT as an outpatient.    Cervical radiculopathy -Patient she had some symptoms suspicious for cervical radiculopathy, which was confirmed by MRI of the cervical spine, I have discussed these findings with neurosurgery Dr. Kathyrn Sheriff, it can be followed as an outpatient, patient given Dr. Kathyrn Sheriff information to schedule an appointment as an outpatient.   DM- blood sugar 112 on presentation. A1c in 04/2020 was 6.2 -Continue home medications.   HTN- well controlled - continue home medications    Discharge Diagnoses:  Active Problems:   Dizziness   AMS (altered mental status)    Discharge Instructions  Discharge Instructions     Ambulatory referral to Physical Therapy   Complete by: As directed    [facility] [place] Needs Vestibular PT  Pt with c/o frontal HA upon PT amb into room. Pt reports the HA to subside when pt was placed in R dix hallpike maneuvers to 1.5-2. Pt found to have R posterior canal BPPV and was treated with the epley maneuver. Pt reports no more dizziness upon standing as she was experience prior to treatment for BPPV. Pt did continue to reports worsening of HA when standing and ambulating   Diet - low sodium heart healthy   Complete by: As directed    Discharge instructions   Complete by: As directed    Follow with Primary MD [name], MD in 7 days   Get CBC, CMP,  checked  by Primary MD next visit.    Activity: As tolerated with Full fall precautions use walker/cane & assistance as  needed   Disposition Home    Diet: Heart Healthy  . On your next visit with your primary care physician please Get Medicines reviewed and adjusted.   Please request your Prim.MD to go over all Hospital Tests and Procedure/Radiological results at the follow up, please get all Hospital records sent to your Prim MD by signing hospital release before you go home.   If you experience  worsening of your admission symptoms, develop shortness of breath, life threatening emergency, suicidal or homicidal thoughts you must seek medical attention immediately by calling 911 or calling your MD immediately  if symptoms less severe.  You Must read complete instructions/literature along with all the possible adverse reactions/side effects for all the Medicines you take and that have been prescribed to you. Take any new Medicines after you have completely understood and accpet all the possible adverse reactions/side effects.   Do not drive when taking Pain medications.    Do not take more than prescribed Pain, Sleep and Anxiety Medications  Special Instructions: If you have smoked or chewed Tobacco  in the last 2 yrs please stop smoking, stop any regular Alcohol  and or any Recreational drug use.  Wear Seat belts while driving.   Please note  You were cared for by a hospitalist during your hospital stay. If you have any questions about your discharge medications or the care you received while you were in the hospital after you are discharged, you can call the unit and asked to speak with the hospitalist on call if the hospitalist that took care of you is not available. Once you are discharged, your primary care physician will handle any further medical issues. Please note that NO REFILLS for any discharge medications will be authorized once you are discharged, as it is imperative that you return to your primary care physician (or establish a relationship with a primary care physician if you do not have one) for your aftercare needs so that they can reassess your need for medications and monitor your lab values.   Increase activity slowly   Complete by: As directed       Allergies as of 09/25/2020       Reactions   Jardiance [empagliflozin]    Frequent UTI   Tape Rash   Clear plastic tape        Medication List     STOP taking these medications    clopidogrel 75 MG  tablet Commonly known as: PLAVIX   clotrimazole-betamethasone cream Commonly known as: LOTRISONE   diltiazem 120 MG 24 hr capsule Commonly known as: TIAZAC   glimepiride 2 MG tablet Commonly known as: AMARYL   MAGNESIUM PO   metoprolol succinate 50 MG 24 hr tablet Commonly known as: TOPROL-XL   niacin 500 MG CR tablet Commonly known as: NIASPAN   NIACIN CR PO       TAKE these medications    Aspirin Buf(CaCarb-MgCarb-MgO) 81 MG Tabs aspirin 81 mg tablet   81 mg by oral route.   atorvastatin 40 MG tablet Commonly known as: LIPITOR Take 40 mg by mouth daily. What changed: Another medication with the same name was removed. Continue taking this medication, and follow the directions you see here.   Cholecalciferol 125 MCG (5000 UT) capsule Take by mouth. What changed: Another medication with the same name was removed. Continue taking this medication, and follow the directions you see here.   conjugated estrogens 0.625 MG/GM vaginal cream Commonly known as: PREMARIN INSERT 1/4 TH GRAM  VAGINALLY TWICE WEEKLY AS  DIRECTED   cyanocobalamin 1000 MCG/ML injection Commonly known as: (VITAMIN B-12) Inject 1,000 mcg as directed 4 (four) times a week. Start with daily injections for 4 days then weekly for 4 weeks.   hydrochlorothiazide 25 MG tablet Commonly known as: HYDRODIURIL Take 25 mg by mouth daily.   levothyroxine 88 MCG tablet Commonly known as: SYNTHROID Take 44-88 mcg by mouth See admin instructions. Take 1 tablet by mouth daily except on Sundays take 44 mcg per patient What changed: Another medication with the same name was removed. Continue taking this medication, and follow the directions you see here.   metFORMIN 500 MG 24 hr tablet Commonly known as: GLUCOPHAGE-XR Take 2 tablets (1,000 mg total) by mouth 2 (two) times daily. Start after 2 days as he received IV contrast. Start taking on: September 28, 2020 What changed:  additional instructions These  instructions start on September 28, 2020. If you are unsure what to do until then, ask your doctor or other care provider. Another medication with the same name was removed. Continue taking this medication, and follow the directions you see here.   olmesartan 20 MG tablet Commonly known as: BENICAR Take 2 tablets by mouth daily. What changed: Another medication with the same name was removed. Continue taking this medication, and follow the directions you see here.   pantoprazole 40 MG tablet Commonly known as: PROTONIX Take 40 mg by mouth 2 (two) times daily before a meal.   potassium chloride SA 20 MEQ tablet Commonly known as: KLOR-CON Take 20 mEq by mouth 2 (two) times daily.   topiramate 25 MG tablet Commonly known as: TOPAMAX Take 1 tablet (25 mg total) by mouth 2 (two) times daily.   Trulicity [id number] Sopn Generic drug: Dulaglutide Inject 0.75 mg into the skin every Sunday. What changed: Another medication with the same name was removed. Continue taking this medication, and follow the directions you see here.   Zinc Methionate 50 MG Caps Take by mouth.        Follow-up Information     [facility] Follow up.   Specialty: Rehabilitation Why: Referral sent electronically for vestibular PT Contact information: [place] [id number] [place]        [name], MD Follow up.   Specialty: Family Medicine Contact information: [id number] [place] [address] [phone]         Consuella Lose, MD Follow up in 4 week(s).   Specialty: Neurosurgery Contact information: [address] [place] [address] [phone]                Allergies  Allergen Reactions   Jardiance [Empagliflozin]     Frequent UTI   Tape Rash    Clear plastic tape    Consultations: neurology   Procedures/Studies: CT  Angio Head W or Wo Contrast  Addendum Date: 09/24/2020   ADDENDUM REPORT: 09/24/2020 07:58 ADDENDUM: Study discussed by telephone with Dr. Armandina Gemma on 09/24/2020 at 0727 hours. Electronically Signed   By: Genevie Ann M.D.   On: 09/24/2020 07:58   Result Date: 09/24/2020 CLINICAL DATA:  65 year old female code stroke presentation. EXAM: CT ANGIOGRAPHY HEAD AND NECK TECHNIQUE: Multidetector CT imaging of the head and neck was performed using the standard protocol during bolus administration of intravenous contrast. Multiplanar CT image reconstructions and MIPs were obtained to evaluate the vascular anatomy. Carotid stenosis  measurements (when applicable) are obtained utilizing NASCET criteria, using the distal internal carotid diameter as the denominator. CONTRAST:  166m OMNIPAQUE IOHEXOL 350 MG/ML SOLN COMPARISON:  Plain head CT 0646 hours today. FINDINGS: CTA NECK Skeleton: No acute osseous abnormality identified. Cervical spine disc and endplate degeneration. Upper chest: Negative. Other neck: Diminutive or absent thyroid. Effaced oropharynx appears physiologic. Negative other visible neck soft tissues. Aortic arch: 3 vessel arch configuration. Minimal arch atherosclerosis. Right carotid system: Negative. Left carotid system: Negative. Vertebral arteries: Proximal right subclavian artery and right vertebral artery origin are normal. Right vertebral is patent and normal to the skull base. Mild plaque at the proximal left subclavian artery without stenosis. Normal left vertebral artery origin. Tortuous left vertebral artery is patent to the skull base without stenosis. CTA HEAD Posterior circulation: Mildly dominant right V4 segment. Patent distal vertebral arteries to the basilar without plaque or stenosis. Both AICAs appear dominant and patent. Patent basilar artery without stenosis. Patent SCA and PCA origins. Posterior communicating arteries are diminutive or absent. Bilateral PCA branches are within normal  limits. Anterior circulation: Both ICA siphons are patent. There is mild calcified siphon plaque and tortuosity bilaterally without stenosis. Patent carotid termini. Patent MCA and ACA origins. Diminutive or absent anterior communicating artery. Bilateral ACA branches are within normal limits. Left MCA M1 segment and bifurcation are patent without stenosis. Right MCA M1 segment and trifurcation are patent without stenosis. Bilateral MCA branches are within normal limits. Venous sinuses: Patent and within normal limits. Right transverse/sigmoid sinus junction arachnoid granulation (normal). Anatomic variants: Mildly dominant right vertebral artery. Review of the MIP images confirms the above findings IMPRESSION: 1. Largely negative CTA Head and Neck. No large vessel occlusion. No atherosclerosis in the neck. Mild ICA siphon atherosclerosis without stenosis. 2. Diminutive or absent thyroid. Electronically Signed: By: HGenevie AnnM.D. On: 09/24/2020 07:24   CT Angio Neck W and/or Wo Contrast  Addendum Date: 09/24/2020   ADDENDUM REPORT: 09/24/2020 07:58 ADDENDUM: Study discussed by telephone with Dr. LArmandina Gemmaon 09/24/2020 at 0727 hours. Electronically Signed   By: HGenevie AnnM.D.   On: 09/24/2020 07:58   Result Date: 09/24/2020 CLINICAL DATA:  65year old female code stroke presentation. EXAM: CT ANGIOGRAPHY HEAD AND NECK TECHNIQUE: Multidetector CT imaging of the head and neck was performed using the standard protocol during bolus administration of intravenous contrast. Multiplanar CT image reconstructions and MIPs were obtained to evaluate the vascular anatomy. Carotid stenosis measurements (when applicable) are obtained utilizing NASCET criteria, using the distal internal carotid diameter as the denominator. CONTRAST:  1027mOMNIPAQUE IOHEXOL 350 MG/ML SOLN COMPARISON:  Plain head CT 0646 hours today. FINDINGS: CTA NECK Skeleton: No acute osseous abnormality identified. Cervical spine disc and endplate degeneration.  Upper chest: Negative. Other neck: Diminutive or absent thyroid. Effaced oropharynx appears physiologic. Negative other visible neck soft tissues. Aortic arch: 3 vessel arch configuration. Minimal arch atherosclerosis. Right carotid system: Negative. Left carotid system: Negative. Vertebral arteries: Proximal right subclavian artery and right vertebral artery origin are normal. Right vertebral is patent and normal to the skull base. Mild plaque at the proximal left subclavian artery without stenosis. Normal left vertebral artery origin. Tortuous left vertebral artery is patent to the skull base without stenosis. CTA HEAD Posterior circulation: Mildly dominant right V4 segment. Patent distal vertebral arteries to the basilar without plaque or stenosis. Both AICAs appear dominant and patent. Patent basilar artery without stenosis. Patent SCA and PCA origins. Posterior communicating arteries are diminutive or absent. Bilateral PCA  branches are within normal limits. Anterior circulation: Both ICA siphons are patent. There is mild calcified siphon plaque and tortuosity bilaterally without stenosis. Patent carotid termini. Patent MCA and ACA origins. Diminutive or absent anterior communicating artery. Bilateral ACA branches are within normal limits. Left MCA M1 segment and bifurcation are patent without stenosis. Right MCA M1 segment and trifurcation are patent without stenosis. Bilateral MCA branches are within normal limits. Venous sinuses: Patent and within normal limits. Right transverse/sigmoid sinus junction arachnoid granulation (normal). Anatomic variants: Mildly dominant right vertebral artery. Review of the MIP images confirms the above findings IMPRESSION: 1. Largely negative CTA Head and Neck. No large vessel occlusion. No atherosclerosis in the neck. Mild ICA siphon atherosclerosis without stenosis. 2. Diminutive or absent thyroid. Electronically Signed: By: Genevie Ann M.D. On: 09/24/2020 07:24   MR BRAIN WO  CONTRAST  Result Date: 09/25/2020 CLINICAL DATA:  Neuro deficit, acute, stroke suspected EXAM: MRI HEAD WITHOUT CONTRAST TECHNIQUE: Multiplanar, multiecho pulse sequences of the brain and surrounding structures were obtained without intravenous contrast. COMPARISON:  July 2021 FINDINGS: Brain: There is no acute infarction or intracranial hemorrhage. There is no intracranial mass, mass effect, or edema. There is no hydrocephalus or extra-axial fluid collection. Ventricles and sulci are stable in size and configuration. Patchy T2 hyperintensity in the supratentorial white matter is nonspecific but may reflect stable chronic microvascular ischemic changes. 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 Vascular: Major vessel flow voids at the skull base are preserved. Skull and upper cervical spine: Normal marrow signal is preserved. Sinuses/Orbits: Paranasal sinuses are aerated. Orbits are unremarkable. Other: Sella is unremarkable.  Mastoid air cells are clear. IMPRESSION: No acute infarction, hemorrhage, or mass. Stable chronic microvascular ischemic changes. Electronically Signed   By: Macy Mis M.D.   On: 09/25/2020 11:57   MR CERVICAL SPINE WO CONTRAST  Result Date: 09/25/2020 CLINICAL DATA:  Myelopathy, acute or progressive EXAM: MRI CERVICAL SPINE WITHOUT CONTRAST TECHNIQUE: Multiplanar, multisequence MR imaging of the cervical spine was performed. No intravenous contrast was administered. COMPARISON:  None. FINDINGS: Alignment: Slight reversal of normal cervical lordosis. Vertebrae: Vertebral body heights are maintained. No focal marrow edema to suggest acute fracture or discitis/osteomyelitis. Mildly heterogeneous bone marrow without suspicious bone lesion. Cord: Normal cord signal. Posterior Fossa, vertebral arteries, paraspinal tissues: Visualized vertebral artery flow voids are maintained. No evidence of acute abnormality in the visualized posterior fossa. Appreciable  paraspinal edema. Disc levels: C2-C3: Small central posterior disc osteophyte complex without significant canal or foraminal stenosis. C3-C4: Small central posterior disc osteophyte complex without significant canal or foraminal stenosis. C4-C5: Posterior disc osteophyte complex contacts and deforms the right paracentral cord with overall mild canal stenosis. Left greater than right facet and uncovertebral hypertrophy with moderate left foraminal stenosis. No significant right foraminal stenosis. C5-C6: Small central posterior disc osteophyte complex without significant canal or foraminal stenosis. C6-C7: Posterior disc osteophyte complex contacts and deforms the left ventral cord with overall mild canal stenosis. No significant foraminal stenosis. C7-T1: Mild uncovertebral hypertrophy without significant canal or foraminal stenosis. IMPRESSION: 1. Posterior disc osteophyte complexes at C4-C5 and C6-C7 contact and deform the ventral cord (more so at C4-C5) with overall mild canal stenosis at these levels. No appreciable cord edema. 2. Moderate left foraminal stenosis at C4-C5. Electronically Signed   By: Margaretha Sheffield M.D.   On: 09/25/2020 12:25   ECHOCARDIOGRAM COMPLETE  Result Date: 09/25/2020    ECHOCARDIOGRAM REPORT   Patient Name:   Terri Waters [facility] Date  of Exam: 09/25/2020 Medical Rec #:  [id number]      Height:       63.0 in Accession #:    [id number]     Weight:       229.3 lb Date of Birth:  10/31/1955      BSA:          2.049 m Patient Age:    65 years       BP:           111/78 mmHg Patient Gender: F              HR:           69 bpm. Exam Location:  Inpatient Procedure: 2D Echo, Cardiac Doppler and Color Doppler Indications:    Stroke                 TIA  History:        Patient has prior history of Echocardiogram examinations, most                 recent 06/24/2019. Risk Factors:Hypertension, Diabetes and                 Dyslipidemia. Ventricular tachycardia.  Sonographer:    Clayton Lefort RDCS (AE)  Referring Phys: [id number] [place]  1. Left ventricular ejection fraction, by estimation, is 60 to 65%. The left ventricle has normal function. The left ventricle has no regional wall motion abnormalities. Left ventricular diastolic parameters are consistent with Grade II diastolic dysfunction (pseudonormalization).  2. Right ventricular systolic function is normal. The right ventricular size is normal. There is normal pulmonary artery systolic pressure.  3. Right atrial size was mildly dilated.  4. The mitral valve is normal in structure. Trivial mitral valve regurgitation. No evidence of mitral stenosis.  5. The aortic valve is tricuspid. Aortic valve regurgitation is not visualized. No aortic stenosis is present.  6. The inferior vena cava is normal in size with greater than 50% respiratory variability, suggesting right atrial pressure of 3 mmHg. FINDINGS  Left Ventricle: Left ventricular ejection fraction, by estimation, is 60 to 65%. The left ventricle has normal function. The left ventricle has no regional wall motion abnormalities. The left ventricular internal cavity size was normal in size. There is  no left ventricular hypertrophy. Left ventricular diastolic parameters are consistent with Grade II diastolic dysfunction (pseudonormalization). Right Ventricle: The right ventricular size is normal. No increase in right ventricular wall thickness. Right ventricular systolic function is normal. There is normal pulmonary artery systolic pressure. The tricuspid regurgitant velocity is 1.99 m/s, and  with an assumed right atrial pressure of 3 mmHg, the estimated right ventricular systolic pressure is 123456 mmHg. Left Atrium: Left atrial size was normal in size. Right Atrium: Right atrial size was mildly dilated. Pericardium: There is no evidence of pericardial effusion. Mitral Valve: The mitral valve is normal in structure. Trivial mitral valve regurgitation. No evidence of mitral valve stenosis.  MV peak gradient, 2.4 mmHg. The mean mitral valve gradient is 1.0 mmHg. Tricuspid Valve: The tricuspid valve is normal in structure. Tricuspid valve regurgitation is trivial. No evidence of tricuspid stenosis. Aortic Valve: The aortic valve is tricuspid. Aortic valve regurgitation is not visualized. No aortic stenosis is present. Aortic valve mean gradient measures 2.0 mmHg. Aortic valve peak gradient measures 3.9 mmHg. Aortic valve area, by VTI measures 3.49 cm. Pulmonic Valve: The pulmonic valve was normal in structure. Pulmonic valve regurgitation is not visualized. No  evidence of pulmonic stenosis. Aorta: The aortic root is normal in size and structure. Venous: The inferior vena cava is normal in size with greater than 50% respiratory variability, suggesting right atrial pressure of 3 mmHg. IAS/Shunts: No atrial level shunt detected by color flow Doppler.  LEFT VENTRICLE PLAX 2D LVIDd:         3.90 cm  Diastology LVIDs:         2.60 cm  LV e' medial:    10.70 cm/s LV PW:         1.00 cm  LV E/e' medial:  8.4 LV IVS:        0.90 cm  LV e' lateral:   15.40 cm/s LVOT diam:     2.20 cm  LV E/e' lateral: 5.9 LV SV:         72 LV SV Index:   35 LVOT Area:     3.80 cm  RIGHT VENTRICLE             IVC RV Basal diam:  4.10 cm     IVC diam: 1.70 cm RV Mid diam:    4.10 cm RV S prime:     14.60 cm/s TAPSE (M-mode): 2.5 cm LEFT ATRIUM           Index       RIGHT ATRIUM           Index LA diam:      2.00 cm 0.98 cm/m  RA Area:     20.30 cm LA Vol (A2C): 33.4 ml 16.30 ml/m RA Volume:   63.40 ml  30.94 ml/m LA Vol (A4C): 28.5 ml 13.91 ml/m  AORTIC VALVE AV Area (Vmax):    3.40 cm AV Area (Vmean):   3.43 cm AV Area (VTI):     3.49 cm AV Vmax:           98.20 cm/s AV Vmean:          64.400 cm/s AV VTI:            0.206 m AV Peak Grad:      3.9 mmHg AV Mean Grad:      2.0 mmHg LVOT Vmax:         87.80 cm/s LVOT Vmean:        58.100 cm/s LVOT VTI:          0.189 m LVOT/AV VTI ratio: 0.92  AORTA Ao Root diam: 3.10 cm Ao  Asc diam:  2.70 cm MITRAL VALVE               TRICUSPID VALVE MV Area (PHT): 3.23 cm    TR Peak grad:   15.8 mmHg MV Area VTI:   3.12 cm    TR Vmax:        199.00 cm/s MV Peak grad:  2.4 mmHg MV Mean grad:  1.0 mmHg    SHUNTS MV Vmax:       0.77 m/s    Systemic VTI:  0.19 m MV Vmean:      51.2 cm/s   Systemic Diam: 2.20 cm MV Decel Time: 235 msec MV E velocity: 90.10 cm/s MV A velocity: 76.50 cm/s MV E/A ratio:  1.18 Skeet Latch MD Electronically signed by Skeet Latch MD Signature Date/Time: 09/25/2020/3:59:45 PM    Final    CT HEAD CODE STROKE WO CONTRAST  Addendum Date: 09/24/2020   ADDENDUM REPORT: 09/24/2020 07:09 ADDENDUM: Study discussed by telephone with Dr. Gerlene Fee on 09/24/2020 at [phone]  hours. Electronically Signed   By: Genevie Ann M.D.   On: 09/24/2020 07:09   Result Date: 09/24/2020 CLINICAL DATA:  Code stroke.  65 year old female EXAM: CT HEAD WITHOUT CONTRAST TECHNIQUE: Contiguous axial images were obtained from the base of the skull through the vertex without intravenous contrast. COMPARISON:  Brain MRI 07/26/2019.  Head CT 06/23/2019. FINDINGS: Brain: Cerebral volume remains normal for age. No midline shift, ventriculomegaly, mass effect, evidence of mass lesion, intracranial hemorrhage or evidence of cortically based acute infarction. Gray-white matter differentiation is stable since last year, with mild for age scattered mostly subcortical white matter hypodensity. Vascular: Mild Calcified atherosclerosis at the skull base. No suspicious intracranial vascular hyperdensity. Skull: Stable, negative. Sinuses/Orbits: Visualized paranasal sinuses and mastoids are stable and well aerated. Other: Visualized orbits and scalp soft tissues are within normal limits. ASPECTS [facility] Stroke Program Early CT Score) Total score (0-10 with 10 being normal): 10 IMPRESSION: 1. No acute cortically based infarct or acute intracranial hemorrhage identified. ASPECTS 10. 2. Mild for age nonspecific  cerebral white matter changes. Electronically Signed: By: Genevie Ann M.D. On: 09/24/2020 06:53     Subjective:  Denies any chest pain, no focal deficits, dizziness has significantly improved. Discharge Exam: Vitals:   09/25/20 0510 09/25/20 0834  BP: 119/73 111/78  Pulse: 89 75  Resp: 16 20  Temp: 97.9 F (36.6 C) 97.7 F (36.5 C)  SpO2: 96% 95%   Vitals:   09/25/20 0115 09/25/20 0340 09/25/20 0510 09/25/20 0834  BP: 107/60 120/66 119/73 111/78  Pulse: 70 89 89 75  Resp: 19 (!) '25 16 20  '$ Temp: 97.9 F (36.6 C) 97.8 F (36.6 C) 97.9 F (36.6 C) 97.7 F (36.5 C)  TempSrc: Oral Oral Oral Oral  SpO2: 95% 94% 96% 95%  Weight:      Height:        General: Pt is alert, awake, not in acute distress Cardiovascular: RRR, S1/S2 +, no rubs, no gallops Respiratory: CTA bilaterally, no wheezing, no rhonchi Abdominal: Soft, NT, ND, bowel sounds + Extremities: no edema, no cyanosis    The results of significant diagnostics from this hospitalization (including imaging, microbiology, ancillary and laboratory) are listed below for reference.     Microbiology: Recent Results (from the past 240 hour(s))  Resp Panel by RT-PCR (Flu A&B, Covid) Nasopharyngeal Swab     Status: None   Collection Time: 09/24/20  6:56 AM   Specimen: Nasopharyngeal Swab; Nasopharyngeal(NP) swabs in vial transport medium  Result Value Ref Range Status   SARS Coronavirus 2 by RT PCR NEGATIVE NEGATIVE Final    Comment: (NOTE) SARS-CoV-2 target nucleic acids are NOT DETECTED.  The SARS-CoV-2 RNA is generally detectable in upper respiratory specimens during the acute phase of infection. The lowest concentration of SARS-CoV-2 viral copies this assay can detect is 138 copies/mL. A negative result does not preclude SARS-Cov-2 infection and should not be used as the sole basis for treatment or other patient management decisions. A negative result may occur with  improper specimen collection/handling, submission of  specimen other than nasopharyngeal swab, presence of viral mutation(s) within the areas targeted by this assay, and inadequate number of viral copies(<138 copies/mL). A negative result must be combined with clinical observations, patient history, and epidemiological information. The expected result is Negative.  Fact Sheet for Patients:  EntrepreneurPulse.com.au  Fact Sheet for Healthcare Providers:  IncredibleEmployment.be  This test is no t yet approved or cleared by the [place] and  has been authorized  for detection and/or diagnosis of SARS-CoV-2 by FDA under an Emergency Use Authorization (EUA). This EUA will remain  in effect (meaning this test can be used) for the duration of the COVID-19 declaration under Section 564(b)(1) of the Act, 21 U.S.C.section 360bbb-3(b)(1), unless the authorization is terminated  or revoked sooner.       Influenza A by PCR NEGATIVE NEGATIVE Final   Influenza B by PCR NEGATIVE NEGATIVE Final    Comment: (NOTE) The Xpert Xpress SARS-CoV-2/FLU/RSV plus assay is intended as an aid in the diagnosis of influenza from Nasopharyngeal swab specimens and should not be used as a sole basis for treatment. Nasal washings and aspirates are unacceptable for Xpert Xpress SARS-CoV-2/FLU/RSV testing.  Fact Sheet for Patients: EntrepreneurPulse.com.au  Fact Sheet for Healthcare Providers: IncredibleEmployment.be  This test is not yet approved or cleared by the [place] FDA and has been authorized for detection and/or diagnosis of SARS-CoV-2 by FDA under an Emergency Use Authorization (EUA). This EUA will remain in effect (meaning this test can be used) for the duration of the COVID-19 declaration under Section 564(b)(1) of the Act, 21 U.S.C. section 360bbb-3(b)(1), unless the authorization is terminated or revoked.  Performed at [facility], [address] [place], [place] [address]      Labs: BNP (last 3 results) No results for input(s): BNP in the last 8760 hours. Basic Metabolic Panel: Recent Labs  Lab 09/24/20 0620 09/24/20 2239 09/25/20 0059 09/25/20 0641  NA 139  --  137  --   K 3.5  --  3.2*  --   CL 103  --  101  --   CO2 28  --  27  --   GLUCOSE 112*  --  120*  --   BUN 14  --  15  --   CREATININE 0.96 1.09* 1.00  --   CALCIUM 8.9  --  9.0  --   MG  --   --   --  1.4*   Liver Function Tests: Recent Labs  Lab 09/24/20 0620  AST 15  ALT 12  ALKPHOS 107  BILITOT 0.3  PROT 7.0  ALBUMIN 3.6   Recent Labs  Lab 09/24/20 0620  LIPASE 35   No results for input(s): AMMONIA in the last 168 hours. CBC: Recent Labs  Lab 09/24/20 0620 09/24/20 2239 09/25/20 0059  WBC 8.2 6.5 6.1  NEUTROABS 4.2  --   --   HGB 11.6* 11.1* 11.2*  HCT 35.3* 34.0* 33.7*  MCV 86.9 85.9 86.6  PLT 296 289 272   Cardiac Enzymes: No results for input(s): CKTOTAL, CKMB, CKMBINDEX, TROPONINI in the last 168 hours. BNP: Invalid input(s): POCBNP CBG: Recent Labs  Lab 09/24/20 0615 09/24/20 1221  GLUCAP 93 88   D-Dimer No results for input(s): DDIMER in the last 72 hours. Hgb A1c Recent Labs    09/25/20 0059  HGBA1C 6.6*   Lipid Profile Recent Labs    09/25/20 0059  CHOL 113  HDL 50  LDLCALC 53  TRIG 49  CHOLHDL 2.3   Thyroid function studies No results for input(s): TSH, T4TOTAL, T3FREE, THYROIDAB in the last 72 hours.  Invalid input(s): FREET3 Anemia work up Recent Labs    09/25/20 0641  VITAMINB12 819   Urinalysis    Component Value Date/Time   COLORURINE STRAW (A) 09/24/2020 [place] 09/24/2020 0755   LABSPEC 1.005 09/24/2020 0755   PHURINE 6.0 09/24/2020 0755   GLUCOSEU NEGATIVE 09/24/2020 0755  HGBUR NEGATIVE 09/24/2020 [place] NEGATIVE 09/24/2020 0755   KETONESUR NEGATIVE 09/24/2020 0755   PROTEINUR NEGATIVE 09/24/2020 0755   NITRITE NEGATIVE 09/24/2020 0755    LEUKOCYTESUR NEGATIVE 09/24/2020 0755   Sepsis Labs Invalid input(s): PROCALCITONIN,  WBC,  LACTICIDVEN Microbiology Recent Results (from the past 240 hour(s))  Resp Panel by RT-PCR (Flu A&B, Covid) Nasopharyngeal Swab     Status: None   Collection Time: 09/24/20  6:56 AM   Specimen: Nasopharyngeal Swab; Nasopharyngeal(NP) swabs in vial transport medium  Result Value Ref Range Status   SARS Coronavirus 2 by RT PCR NEGATIVE NEGATIVE Final    Comment: (NOTE) SARS-CoV-2 target nucleic acids are NOT DETECTED.  The SARS-CoV-2 RNA is generally detectable in upper respiratory specimens during the acute phase of infection. The lowest concentration of SARS-CoV-2 viral copies this assay can detect is 138 copies/mL. A negative result does not preclude SARS-Cov-2 infection and should not be used as the sole basis for treatment or other patient management decisions. A negative result may occur with  improper specimen collection/handling, submission of specimen other than nasopharyngeal swab, presence of viral mutation(s) within the areas targeted by this assay, and inadequate number of viral copies(<138 copies/mL). A negative result must be combined with clinical observations, patient history, and epidemiological information. The expected result is Negative.  Fact Sheet for Patients:  EntrepreneurPulse.com.au  Fact Sheet for Healthcare Providers:  IncredibleEmployment.be  This test is no t yet approved or cleared by the [place] FDA and  has been authorized for detection and/or diagnosis of SARS-CoV-2 by FDA under an Emergency Use Authorization (EUA). This EUA will remain  in effect (meaning this test can be used) for the duration of the COVID-19 declaration under Section 564(b)(1) of the Act, 21 U.S.C.section 360bbb-3(b)(1), unless the authorization is terminated  or revoked sooner.       Influenza A by PCR NEGATIVE NEGATIVE Final   Influenza B  by PCR NEGATIVE NEGATIVE Final    Comment: (NOTE) The Xpert Xpress SARS-CoV-2/FLU/RSV plus assay is intended as an aid in the diagnosis of influenza from Nasopharyngeal swab specimens and should not be used as a sole basis for treatment. Nasal washings and aspirates are unacceptable for Xpert Xpress SARS-CoV-2/FLU/RSV testing.  Fact Sheet for Patients: EntrepreneurPulse.com.au  Fact Sheet for Healthcare Providers: IncredibleEmployment.be  This test is not yet approved or cleared by the [place] FDA and has been authorized for detection and/or diagnosis of SARS-CoV-2 by FDA under an Emergency Use Authorization (EUA). This EUA will remain in effect (meaning this test can be used) for the duration of the COVID-19 declaration under Section 564(b)(1) of the Act, 21 U.S.C. section 360bbb-3(b)(1), unless the authorization is terminated or revoked.  Performed at [facility], [address] [place], [place] [address]      Time coordinating discharge: Over 30 minutes  SIGNED:   Phillips Climes, MD  [facility] 09/25/2020, 4:26 PM Pager   If 7PM-7AM, please contact night-coverage [email or web address] Password [email or web address]

## 2020-09-25 NOTE — Progress Notes (Signed)
  Echocardiogram 2D Echocardiogram has been performed.  Terri Waters 09/25/2020, 3:04 PM

## 2020-09-25 NOTE — Evaluation (Signed)
Physical Therapy Evaluation Patient Details Name: Terri Waters MRN: [id number] DOB: 17-Jun-1955 Today's Date: 09/25/2020  History of Present Illness  Terri Waters is a 65 y.o. female presenting to ED on 09/24/20 with several hours of dizziness, nausea, diaphoresis which is similar to the episodes she had in June and was diagnosed with BPPV which resolved with PT PMH: non-insulin dependent DM, HLD, hypothyroidism s/p papillary ca, complex migraines BPPV   Clinical Impression  Pt admitted with above. Pt with c/o frontal HA upon PT amb into room. Pt reports the HA to subside when pt was placed in R dix hallpike maneuvers to 1.5-2. Pt found to have R posterior canal BPPV and was treated with the epley maneuver. Pt reports no more dizziness upon standing as she was experience prior to treatment for BPPV. Pt did continue to reports worsening of HA when standing and ambulating. Pt planned for MRI today. Will check results when available. Acute PT to cont to follow.       Recommendations for follow up therapy are one component of a multi-disciplinary discharge planning process, led by the attending physician.  Recommendations may be updated based on patient status, additional functional criteria and insurance authorization.  Follow Up Recommendations Outpatient PT (for vestibular therapy)    Equipment Recommendations  None recommended by PT    Recommendations for Other Services       Precautions / Restrictions Precautions Precautions: Fall Precaution Comments: pt with R posterior canal BPPV Restrictions Weight Bearing Restrictions: No      Mobility  Bed Mobility Overal bed mobility: Modified Independent             General bed mobility comments: guarded due to onset of dizziness and increased HA with positional changes    Transfers Overall transfer level: Needs assistance Equipment used: None Transfers: Sit to/from Stand Sit to Stand: Min guard         General transfer  comment: min guard for safety due to onset of dizziness upon standing, ceased s/p 20 sec of standing. pt with report of increased frontal HA with standing  Ambulation/Gait Ambulation/Gait assistance: Min guard Gait Distance (Feet): 30 Feet (stayed in room) Assistive device: None Gait Pattern/deviations: Step-through pattern;Decreased stride length Gait velocity: slow and guarded Gait velocity interpretation: <1.31 ft/sec, indicative of household ambulator General Gait Details: guarded due to worsening HA, verbal cues for gaze stabilization when turning to minimize onset of dizziness/room spinning  Stairs            Wheelchair Mobility    Modified Rankin (Stroke Patients Only) Modified Rankin (Stroke Patients Only) Pre-Morbid Rankin Score: No significant disability Modified Rankin: Slight disability     Balance Overall balance assessment: Mild deficits observed, not formally tested                                           Pertinent Vitals/Pain Pain Assessment: 0-10 Pain Score: 4  Pain Location: frontal Pain Descriptors / Indicators: Headache Pain Intervention(s): Monitored during session    Home Living Family/patient expects to be discharged to:: Private residence Living Arrangements: Spouse/significant other (and grandson) Available Help at Discharge: Family;Available 24 hours/day (grandson works, spouse retired but in/out, pt works from home, family can provide 24/7) Type of Home: House Home Access: Stairs to enter Entrance Stairs-Rails: Right;Left;Can reach both Technical brewer of Steps: [place]: [place]: [employer] -  single point      Prior Function Level of Independence: Independent         Comments: driving, 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 [employer]: Right    Extremity/Trunk Assessment   Upper Extremity Assessment Upper Extremity Assessment: Overall WFL for  tasks assessed    Lower Extremity Assessment Lower Extremity Assessment: Overall WFL for tasks assessed    Cervical / Trunk Assessment Cervical / Trunk Assessment: Normal  Communication   Communication: No difficulties  Cognition Arousal/Alertness: Awake/alert Behavior During Therapy: WFL for tasks assessed/performed Overall Cognitive Status: Within Functional Limits for tasks assessed                                        General Comments General comments (skin integrity, edema, etc.): Vestibular Assessment: pt found ot have R posterior canal BPPV vis noted nystagmus and and onset of room spinning with R diz hall pike maneuver, pt with no report of symtoms or nystagmus with L dix hall pike maneuver. Pt treated with epley maneuver. Upon standing pt denied dizziness like she experience prior to BPPV treatment. Pts BP did drop from 122/58 to 104/47 from long sit to dix hallpike. pt also reported that her frontal HA improved with head in extension over end of bed during epley maneuver    Exercises     Assessment/Plan    PT Assessment    PT Problem List         PT Treatment Interventions      PT Goals (Current goals can be found in the Care Plan section)  Acute Rehab PT Goals Patient Stated Goal: stop the headache PT Goal Formulation: With patient Time For Goal Achievement: 10/09/20 Potential to Achieve Goals: Good    Frequency     Barriers to discharge        Co-evaluation               AM-PAC PT "6 Clicks" Mobility  Outcome Measure Help needed turning from your back to your side while in a flat bed without using bedrails?: None Help needed moving from lying on your back to sitting on the side of a flat bed without using bedrails?: None Help needed moving to and from a bed to a chair (including a wheelchair)?: None Help needed standing up from a chair using your arms (e.g., wheelchair or bedside chair)?: A Little Help needed to walk in hospital  room?: A Little Help needed climbing 3-5 steps with a railing? : A Little 6 Click Score: 21    End of Session Equipment Utilized During Treatment: Gait belt Activity Tolerance: Patient tolerated treatment well Patient left: in chair;with call bell/phone within reach;with family/visitor present Nurse Communication: Mobility status PT Visit Diagnosis: Unsteadiness on feet (R26.81);Muscle weakness (generalized) (M62.81);Difficulty in walking, not elsewhere classified (R26.2)    Time: [id number] PT Time Calculation (min) (ACUTE ONLY): 35 min   Charges:   PT Evaluation $PT Eval Moderate Complexity: 1 Mod PT Treatments $Gait Training: 8-22 mins $Canalith Rep Proc: 8-22 mins        Kittie Plater, PT, DPT Acute Rehabilitation Services Pager #: [phone] Office #: [phone]   Berline Lopes 09/25/2020, 8:36 AM

## 2020-09-27 ENCOUNTER — Ambulatory Visit: Payer: 59 | Admitting: Physical Therapy

## 2020-09-27 LAB — METHYLMALONIC ACID, SERUM: Methylmalonic Acid, Quantitative: 122 nmol/L (ref 0–378)

## 2020-10-01 ENCOUNTER — Encounter: Payer: Self-pay | Admitting: Physical Therapy

## 2020-10-01 ENCOUNTER — Ambulatory Visit: Payer: 59 | Admitting: Physical Therapy

## 2020-10-01 ENCOUNTER — Other Ambulatory Visit: Payer: Self-pay

## 2020-10-01 DIAGNOSIS — M5412 Radiculopathy, cervical region: Secondary | ICD-10-CM

## 2020-10-01 DIAGNOSIS — M62838 Other muscle spasm: Secondary | ICD-10-CM

## 2020-10-01 DIAGNOSIS — R293 Abnormal posture: Secondary | ICD-10-CM

## 2020-10-01 DIAGNOSIS — M6281 Muscle weakness (generalized): Secondary | ICD-10-CM

## 2020-10-01 DIAGNOSIS — M542 Cervicalgia: Secondary | ICD-10-CM

## 2020-10-01 NOTE — Therapy (Signed)
[facility] [address]  [place], [place], [address] Phone: [phone]   Fax:  [phone]  Physical Therapy Treatment  Patient Details  Name: KOURTNIE SACHS MRN: [id number] Date of Birth: [birth year] Referring Provider (PT): Melina Schools, MD   Encounter Date: 10/01/2020   PT End of Session - 10/01/20 1622     Visit Number 2    Number of Visits 16    Date for PT Re-Evaluation 11/15/20    Authorization Type UHC    PT Start Time 1612    PT Stop Time 1702    PT Time Calculation (min) 50 min    Activity Tolerance Patient tolerated treatment well    Behavior During Therapy [facility] for tasks assessed/performed             Past Medical History:  Diagnosis Date   Cancer ([place])    papillary carcinoma   Diabetes mellitus without complication ([place])    High cholesterol    Hypertension    Hypothyroidism    Migraines    Stroke [facility])    Thyroid disease    Ventricular tachycardia ([place])     Past Surgical History:  Procedure Laterality Date   ABDOMINAL HYSTERECTOMY     BREAST SURGERY     lumps removed - over 20 years ago   THYROIDECTOMY     TOTAL KNEE ARTHROPLASTY Right 12/18/2017   Procedure: RIGHT TOTAL KNEE ARTHROPLASTY;  Surgeon: Netta Cedars, MD;  Location: [place];  Service: Orthopedics;  Laterality: Right;    There were no vitals filed for this visit.   Subjective Assessment - 10/01/20 1615     Subjective Pt reports she was admitted to hospital on 9/12 for vertigo and determined to have BPPV which was treated by the acute care PT with Epley maneuver which seemed to resolve her symptoms of dizziness at the time. Referral sent for OP PT f/u for BPPV in addition to her current POC for cervicalgia.    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 Yes    Pain Score 5    4-5/10   Pain Location Neck    Pain Orientation Upper    Pain Descriptors / Indicators Dull    Pain Type Acute pain    Pain Onset --   April/May off & on; got bad as of June 2022   Pain Frequency Intermittent    Pain Score 6   5-6/10   Pain Location Shoulder    Pain Orientation Right    Pain Descriptors / Indicators Sore;Aching    Pain Type Acute pain    Pain Radiating Towards throbbing pain R hand the other night    Pain Onset In the past 7 days    Pain Frequency Intermittent                               [facility] Adult PT Treatment/Exercise - 10/01/20 1612       Neck Exercises: Machines for Strengthening   UBE (Upper Arm Bike) L1.5 x 6 min (3' fwd/3' back)      Shoulder Exercises: Supine   Horizontal ABduction Both;10 reps;Strengthening;Theraband  Theraband Level (Shoulder Horizontal ABduction) Level 1 (Yellow)    Horizontal ABduction Limitations hooklying - cues for scap retraction   intiial tolerance limited d/t R shoulder pain but better after manual TPR to R anterolateral deltoid   External Rotation Both;10 reps;Strengthening;Theraband    Theraband Level (Shoulder External Rotation) Level 1 (Yellow)    External Rotation Limitations hooklying - cues for scap retraction      Manual Therapy   Manual Therapy Joint mobilization;Soft tissue mobilization;Myofascial release;Passive ROM;Manual Traction    Joint Mobilization gentle cervical CPAs and side glides - pt reports increased numbness in R UE    Soft tissue mobilization STM to B cervical paraspinals, UT, LS, suboccipitals and R shoulder complex    Myofascial Release B subocciptal release - poor tolerance d/t report of increased HA; manual TPR to R anterolateral deltoid - improved exercise tolerance following TPR    Passive ROM gentle cervical ROM in all planes + manual UT/LS stretches - limited tolernace for R side bend &  rotation d/t increase in R UE numbness    Manual Traction gentle cervical distraction with prolonged hold                     PT Education - 10/01/20 1853     Education Details Office workstation posture - Access Code: [email or web address]    Person(s) Educated Patient    Methods Explanation;Demonstration;Handout    Comprehension Verbalized understanding              PT Short Term Goals - 10/01/20 1639       PT SHORT TERM GOAL #1   Title Patient will be independent with initial HEP    Status On-going    Target Date 10/18/20               PT Long Term Goals - 10/01/20 1639       PT LONG TERM GOAL #1   Title Patient will be independent with ongoing/advanced HEP for self-management at home    Status On-going    Target Date 11/15/20      PT LONG TERM GOAL #2   Title Improve posture and alignment with patient to demonstrate improved upright posture with posterior shoulder girdle engaged    Status On-going    Target Date 11/15/20      PT LONG TERM GOAL #3   Title Patient to demonstrate cervical AROM WFL and without pain limiting    Status On-going    Target Date 11/15/20      PT LONG TERM GOAL #4   Title Patient to report >/= 50-70% improvement in night pain to reduce sleep disturbance    Status On-going    Target Date 11/15/20      PT LONG TERM GOAL #5   Title Patient to demonstrate B UE strength >/= 4+/5 and grip strength at least 20lbs    Status On-going    Target Date 11/15/20                   Plan - 10/01/20 1640     Clinical Impression Statement Willma reports she was admitted to the hospital last week for 2 days secondary to acute onset of vertigo and headache. Evaluation by acute care PT revealed positive Marye Round for R posterior canal BPPV and she was treated with the Epley maneuver which resulted in resolution of her dizziness but she c/o ongoing headache. Referral received to include vestibular PT in her current POC. Pt  already aware  of VOR exercises from a prior PT episode for vestibular rehab and denies need for review at this time. She reports minimal dizziness since discharge from hospital but still having pain at the base of her skull with associated headaches as well as increased R shoulder pain for past 2 days. Limited tolerance for manual suboccipital release or STM/TPR to neck and shoulder but able to get some relief of R shoulder pain allowing for improved exercise tolerance. May consider DN for active TPs if pain and headaches persist. Discussed potential impact of her desk posture in relation to pain and increased muscle tension with pt admitting to several postural faults in relation to her workstation set-up - handout provided explaining ideal office posture. Will continue focus on neck and shoulder pain, addressing vestibular concerns as indicated.    Comorbidities stroke, migraines, hypothyroidism, HTN, HLD, DM, hx breast CA, R TKA    Rehab Potential Good    PT Frequency 2x / week    PT Duration 8 weeks   6-8 weeks   PT Treatment/Interventions ADLs/Self Care Home Management;Cryotherapy;Electrical Stimulation;Iontophoresis 4mg /ml Dexamethasone;Moist Heat;Traction;Ultrasound;Functional mobility training;Therapeutic activities;Therapeutic exercise;Neuromuscular re-education;Patient/family education;Manual techniques;Passive range of motion;Dry needling;Energy conservation;Taping;Spinal Manipulations    PT Next Visit Plan Review initial HEP; cervical and postural stretching; gentle cervical ROM; postural strengthening, manual therapy as indicated; vestibular rehab PRN    PT Home Exercise Plan Access Code: [email or web address] (9/8)    Consulted and Agree with Plan of Care Patient             Patient will benefit from skilled therapeutic intervention in order to improve the following deficits and impairments:  Decreased activity tolerance, Decreased knowledge of precautions, Decreased range of motion, Decreased strength, Increased  edema, Increased fascial restricitons, Increased muscle spasms, Impaired perceived functional ability, Impaired flexibility, Impaired UE functional use, Improper body mechanics, Postural dysfunction, Pain  Visit Diagnosis: Cervicalgia  Radiculopathy, cervical region  Muscle weakness (generalized)  Other muscle spasm  Abnormal posture     Problem List Patient Active Problem List   Diagnosis Date Noted   Dizziness 09/24/2020   AMS (altered mental status) 09/24/2020   Cerebellar infarct ([place]) 06/23/2019   Status post total knee replacement, 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

## 2020-10-09 ENCOUNTER — Ambulatory Visit: Payer: 59 | Admitting: Physical Therapy

## 2020-10-09 ENCOUNTER — Other Ambulatory Visit: Payer: Self-pay

## 2020-10-09 DIAGNOSIS — M62838 Other muscle spasm: Secondary | ICD-10-CM

## 2020-10-09 DIAGNOSIS — M5412 Radiculopathy, cervical region: Secondary | ICD-10-CM

## 2020-10-09 DIAGNOSIS — R293 Abnormal posture: Secondary | ICD-10-CM

## 2020-10-09 DIAGNOSIS — M542 Cervicalgia: Secondary | ICD-10-CM | POA: Diagnosis not present

## 2020-10-09 DIAGNOSIS — R42 Dizziness and giddiness: Secondary | ICD-10-CM

## 2020-10-09 DIAGNOSIS — M6281 Muscle weakness (generalized): Secondary | ICD-10-CM

## 2020-10-09 NOTE — Therapy (Signed)
[facility] [address]  [place], [place], [address] Phone: [phone]   Fax:  [phone]  Physical Therapy Evaluation  Patient Details  Name: Terri Waters MRN: [id number] Date of Birth: 02/01/1955 Referring Provider (PT): Phillips Climes   Encounter Date: 10/09/2020   PT End of Session - 10/09/20 1617     Visit Number 3    Number of Visits 16    Date for PT Re-Evaluation 11/15/20    Authorization Type UHC    PT Start Time 3710    PT Stop Time 6269    PT Time Calculation (min) 45 min    Activity Tolerance Patient tolerated treatment well    Behavior During Therapy [facility] for tasks assessed/performed             Past Medical History:  Diagnosis Date   Cancer ([place])    papillary carcinoma   Diabetes mellitus without complication ([place])    High cholesterol    Hypertension    Hypothyroidism    Migraines    Stroke [facility])    Thyroid disease    Ventricular tachycardia ([place])     Past Surgical History:  Procedure Laterality Date   ABDOMINAL HYSTERECTOMY     BREAST SURGERY     lumps removed - over 20 years ago   THYROIDECTOMY     TOTAL KNEE ARTHROPLASTY Right 12/18/2017   Procedure: RIGHT TOTAL KNEE ARTHROPLASTY;  Surgeon: Netta Cedars, MD;  Location: [place];  Service: Orthopedics;  Laterality: Right;    There were no vitals filed for this visit.    Subjective Assessment - 10/09/20 1538     Subjective Terri Waters was admitted to hospital on  09/24/20 with dizzines, treated for R sided BPPV, which improved symptoms significantly.  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  She reports that she saw the neurologist, and Dr. Everette Rank told her the dizziness is mostly likely from her complex migraines, these can cause dizziness and stroke like  symptoms.  She has history of BPPV, migraines, and stroke like symptoms in the past, diagnosed with cerebellar infart last June, in which she had the same symptoms, headache and dizziness..    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 Yes    Pain Score 3     Pain Location Neck    Pain Descriptors / Indicators Headache;Dull    Pain Radiating Towards down right arm, last night hurt like a toothache in arm    Pain Onset More than a month ago                [facility] PT Assessment - 10/09/20 0001       Assessment   Medical Diagnosis R42 Vertigo; Dizziness    Referring Provider (PT) [place], [place]    Prior Therapy yes      Precautions   Precautions None      Restrictions   Weight Bearing Restrictions No      Balance Screen   Has the patient fallen in the past 6 months No  Has the patient had a decrease in activity level because of a fear of falling?  No    Is the patient reluctant to leave their home because of a fear of falling?  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    Type of Dizziness  Blurred vision;Spinning;"World moves"    Frequency of Dizziness maybe 1x/day depending on activities    Duration of Dizziness depends, lasted a few minutes after chair spun around, brief when getting up    Symptom Nature Motion provoked;Positional    Aggravating Factors Turning body quickly;Sit to stand;Turning head quickly    Relieving Factors Closing eyes    Progression of Symptoms Better    History of similar episodes dizziness when had stroke in 2021, but no dizziness since until she developed BPPV on 09/24/20.      Oculomotor Exam   Oculomotor Alignment Normal    Spontaneous Absent    Head shaking Horizontal Absent   but dizzy   Head Shaking Vertical Absent    Smooth Pursuits Saccades   saccades with vertical eye movements looking up     Positional Testing   Dix-Hallpike Dix-Hallpike Right;Dix-Hallpike Left    Horizontal Canal Testing Horizontal Canal Right;Horizontal Canal Left      Dix-Hallpike Right   Dix-Hallpike Right Duration negative    Dix-Hallpike Right Symptoms No nystagmus      Dix-Hallpike Left   Dix-Hallpike Left Duration negative    Dix-Hallpike Left Symptoms No nystagmus      Horizontal Canal Right   Horizontal Canal Right Duration negative    Horizontal Canal Right Symptoms Normal      Horizontal Canal Left   Horizontal Canal Left Duration 5 seconds    Horizontal Canal Left Symptoms Normal      Positional Sensitivities   Head Turning x 5 Lightheadedness    Head Nodding x 5 Lightheadedness                Objective measurements completed on examination: See above findings.                PT Education - 10/09/20 1616     Education Details education on findings, current plan of care, HEP for VOR exercises    Person(s) Educated Patient    Methods  Explanation;Demonstration;Handout    Comprehension Verbalized understanding;Returned demonstration              PT Short Term  Goals - 10/01/20 1639       PT SHORT TERM GOAL #1   Title Patient will be independent with initial HEP    Status On-going    Target Date 10/18/20               PT Long Term Goals - 10/09/20 1648       PT LONG TERM GOAL #1   Title Patient will be independent with ongoing/advanced HEP for self-management at home    Time 6    Period Weeks    Status On-going    Target Date 11/15/20      PT LONG TERM GOAL #2   Title Improve posture and alignment with patient to demonstrate improved upright posture with posterior shoulder girdle engaged    Time 6    Period Weeks    Status On-going    Target Date 11/15/20      PT LONG TERM GOAL #3   Title Patient to demonstrate cervical AROM WFL and without pain limiting    Time 6    Period Weeks    Status On-going    Target Date 11/15/20      PT LONG TERM GOAL #4   Title Patient to report >/= 50-70% improvement in night pain to reduce sleep disturbance    Time 6    Period Weeks    Status On-going    Target Date 11/15/20      PT LONG TERM GOAL #5   Title Patient to demonstrate B UE strength >/= 4+/5 and grip strength at least 20lbs    Time 6    Period Weeks    Status On-going    Target Date 11/15/20      Additional Long Term Goals   Additional Long Term Goals Yes      PT LONG TERM GOAL #6   Title Pt. will report 50% improvement in dizziness symptoms.    Baseline brief mild symptoms but on daily basis    Time 6    Period Weeks    Status New    Target Date 11/15/20                    Plan - 10/09/20 1626     Clinical Impression Statement Patient has received order for vertigo and dizziness, continues to report brief dizziness with positional changes.  She did not demonstrate any symptoms with Dix-Hallpike or Roll test - she did have very brief dizziness with head roll to L but no  nystagmus.  With positional changes she had variable symptoms - sometimes quick head movements would cause symptoms, other times they did not.  She did demonstrate more saccades with vertical eye movements.   We discussed today that her symptoms mostly likely due to history of complex migraines and cervicogenic component, not BPPV.  Reviewed office set-up, she has already made modifications per advice of PT.  Also given VOR x1 exercises, to be done with plain background and seated.  Recommend continueing with current POC focusing on decreasing neck pain, headache, and radicular symptoms, with update to vestibular exercises as needed.    Comorbidities stroke, migraines, hypothyroidism, HTN, HLD, DM, hx breast CA, R TKA    Rehab Potential Good    PT Frequency 2x / week    PT Duration 8 weeks   6-8 weeks   PT Treatment/Interventions ADLs/Self Care Home Management;Cryotherapy;Electrical Stimulation;Iontophoresis 4mg /ml Dexamethasone;Moist Heat;Traction;Ultrasound;Functional mobility training;Therapeutic activities;Therapeutic exercise;Neuromuscular re-education;Patient/family education;Manual techniques;Passive range of motion;Dry needling;Energy conservation;Taping;Spinal  Manipulations    PT Next Visit Plan Review initial HEP; cervical and postural stretching; gentle cervical ROM; postural strengthening, manual therapy as indicated; vestibular rehab PRN    PT Home Exercise Plan Access Code: [email or web address] (9/8)    Consulted and Agree with Plan of Care Patient             Patient will benefit from skilled therapeutic intervention in order to improve the following deficits and impairments:  Decreased activity tolerance, Decreased knowledge of precautions, Decreased range of motion, Decreased strength, Increased edema, Increased fascial restricitons, Increased muscle spasms, Impaired perceived functional ability, Impaired flexibility, Impaired UE functional use, Improper body mechanics, Postural dysfunction,  Pain  Visit Diagnosis: Cervicalgia  Radiculopathy, cervical region  Muscle weakness (generalized)  Other muscle spasm  Abnormal posture  Dizziness and giddiness     Problem List Patient Active Problem List   Diagnosis Date Noted   Dizziness 09/24/2020   AMS (altered mental status) 09/24/2020   Cerebellar infarct ([place]) 06/23/2019   Status post total knee replacement, 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

## 2020-10-09 NOTE — Patient Instructions (Signed)
Access Code: [email or web address] URL: [email or web address] Date: 10/09/2020 Prepared by: Glenetta Hew  Exercises Seated Gaze Stabilization with Head Nod - 2 x daily - 7 x weekly - 3 sets - 10 reps Seated Gaze Stabilization with Head Rotation - 2 x daily - 7 x weekly - 3 sets - 10 reps

## 2020-10-15 ENCOUNTER — Encounter: Payer: Self-pay | Admitting: Physical Therapy

## 2020-10-15 ENCOUNTER — Ambulatory Visit: Payer: 59 | Attending: Family Medicine | Admitting: Physical Therapy

## 2020-10-15 ENCOUNTER — Other Ambulatory Visit: Payer: Self-pay

## 2020-10-15 ENCOUNTER — Encounter: Payer: 59 | Admitting: Physical Therapy

## 2020-10-15 ENCOUNTER — Ambulatory Visit: Payer: 59 | Admitting: Physical Therapy

## 2020-10-15 DIAGNOSIS — R2681 Unsteadiness on feet: Secondary | ICD-10-CM | POA: Diagnosis present

## 2020-10-15 DIAGNOSIS — R293 Abnormal posture: Secondary | ICD-10-CM | POA: Diagnosis present

## 2020-10-15 DIAGNOSIS — M542 Cervicalgia: Secondary | ICD-10-CM

## 2020-10-15 DIAGNOSIS — M6281 Muscle weakness (generalized): Secondary | ICD-10-CM | POA: Diagnosis present

## 2020-10-15 DIAGNOSIS — M62838 Other muscle spasm: Secondary | ICD-10-CM | POA: Diagnosis present

## 2020-10-15 DIAGNOSIS — R42 Dizziness and giddiness: Secondary | ICD-10-CM

## 2020-10-15 DIAGNOSIS — M5412 Radiculopathy, cervical region: Secondary | ICD-10-CM | POA: Diagnosis present

## 2020-10-15 NOTE — Therapy (Signed)
[facility] [address]  [place], [place], [address] Phone: [phone]   Fax:  [phone]  Physical Therapy Treatment  Patient Details  Name: Terri Waters MRN: [id number] Date of Birth: Jun 07, 1955 Referring Provider (PT): Phillips Climes   Encounter Date: 10/15/2020   PT End of Session - 10/15/20 1625     Visit Number 4    Number of Visits 16    Date for PT Re-Evaluation 11/15/20    Authorization Type UHC    PT Start Time 1625    PT Stop Time 1704    PT Time Calculation (min) 39 min    Activity Tolerance Patient tolerated treatment well;Patient limited by pain    Behavior During Therapy [facility] for tasks assessed/performed             Past Medical History:  Diagnosis Date   Cancer ([place])    papillary carcinoma   Diabetes mellitus without complication ([place])    High cholesterol    Hypertension    Hypothyroidism    Migraines    Stroke [facility])    Thyroid disease    Ventricular tachycardia     Past Surgical History:  Procedure Laterality Date   ABDOMINAL HYSTERECTOMY     BREAST SURGERY     lumps removed - over 20 years ago   THYROIDECTOMY     TOTAL KNEE ARTHROPLASTY Right 12/18/2017   Procedure: RIGHT TOTAL KNEE ARTHROPLASTY;  Surgeon: Netta Cedars, MD;  Location: [facility];  Service: Orthopedics;  Laterality: Right;    There were no vitals filed for this visit.   Subjective Assessment - 10/15/20 1626     Subjective Pt reports no further dizziness but notes increased neck pain over the weekend on B sides and mostly on the R today.    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 Yes    Pain Score 5     Pain Location Neck   & R shoulder   Pain Orientation Right    Pain Descriptors /  Indicators Aching    Pain Type Acute pain    Pain Radiating Towards down right arm, like a toothache in arm; numb in R 3rd & 4th digits    Pain Frequency Constant                               [facility] Adult PT Treatment/Exercise - 10/15/20 1624       Neck Exercises: Machines for Strengthening   UBE (Upper Arm Bike) L1.5 x 6 min (3' fwd/3' back)      Neck Exercises: Seated   Neck Retraction 10 reps;5 secs    Other Seated Exercise Scap retraction & depression 10 x 5"      Neck Exercises: Supine   X to V 5 reps    X to V Limitations pt noting onset of B hand numbness    Shoulder ABduction Both;10 reps   horiz ABD   Shoulder Abduction Limitations pt noting onset of B hand numbness with arms in vertical position      Neck Exercises: Stretches   Upper Trapezius Stretch Right;Left;1 rep;30 seconds    Levator Stretch Right;Left;1  rep;30 seconds    Other Neck Stretches R low doorway stretch x 10 sec - deferred d/t increased anterior shoulder pain      Manual Therapy   Manual Therapy Joint mobilization;Soft tissue mobilization;Myofascial release;Manual Traction    Joint Mobilization gentle R shoulder distraction - increased R 3rd & 4th digit numbness    Soft tissue mobilization STM/DTM to B cervical paraspinals, R>L UT, LS, suboccipitals and R shoulder complex, esp anterolateral deltoid - very ttp in mid R UT and anterolateral deltoid with increased R 3rd & 4th digit numbness reported with deep pressure    Myofascial Release manual TPR to R UT & anterolateral deltoid    Manual Traction gentle cervical distraction with prolonged hold - pt reporting worsening of R 3rd & 4th digit numbness                       PT Short Term Goals - 10/01/20 1639       PT SHORT TERM GOAL #1   Title Patient will be independent with initial HEP    Status On-going    Target Date 10/18/20               PT Long Term Goals - 10/15/20 1702       PT LONG TERM GOAL #1    Title Patient will be independent with ongoing/advanced HEP for self-management at home    Status On-going    Target Date 11/15/20      PT LONG TERM GOAL #2   Title Improve posture and alignment with patient to demonstrate improved upright posture with posterior shoulder girdle engaged    Status On-going    Target Date 11/15/20      PT LONG TERM GOAL #3   Title Patient to demonstrate cervical AROM WFL and without pain limiting    Status On-going    Target Date 11/15/20      PT LONG TERM GOAL #4   Title Patient to report >/= 50-70% improvement in night pain to reduce sleep disturbance    Status On-going    Target Date 11/15/20      PT LONG TERM GOAL #5   Title Patient to demonstrate B UE strength >/= 4+/5 and grip strength at least 20lbs    Status On-going    Target Date 11/15/20      PT LONG TERM GOAL #6   Title Pt. will report 50% improvement in dizziness symptoms.    Baseline brief mild symptoms but on daily basis    Status On-going    Target Date 11/15/20                   Plan - 10/15/20 1704     Clinical Impression Statement Terri Waters reports dizziness seems to have resolved but still has ongoing neck and R shoulder/UE pain along with intermittent numbness mostly localized to R 3rd and 4th digits. She attributes increased pain to prolonged time at desk but does report she has made modifications to workstation setup as previously discussed and does feel that this is helping. She reports HEP has been going well, other than not doing the doorway pec stretches d/t increased R anterior shoulder pain. Review of pec stretch providing correction for UE position in ER rather than IR as well as step-through rather than fwd lean but still triggering increased R shoulder pain, therefore deferred. Significant increased muscle tension evident in R mid UT and anterolateral deltoid which reproduced above numbness  with deep pressure - pt would likely benefit from DN (DN explained and  educational handout provided for review), but would like time to think about it as she is apprehensive about needles. Limited tolerance for majority of attempted exercises as they reproduced pain and/or R 3rd and 4th digit numbness. She may benefit from instruction in nerve glides given intermittent radicular symptoms.    Comorbidities stroke, migraines, hypothyroidism, HTN, HLD, DM, hx breast CA, R TKA    Rehab Potential Good    PT Frequency 2x / week    PT Duration 8 weeks   6-8 weeks   PT Treatment/Interventions ADLs/Self Care Home Management;Cryotherapy;Electrical Stimulation;Iontophoresis 4mg /ml Dexamethasone;Moist Heat;Traction;Ultrasound;Functional mobility training;Therapeutic activities;Therapeutic exercise;Neuromuscular re-education;Patient/family education;Manual techniques;Passive range of motion;Dry needling;Energy conservation;Taping;Spinal Manipulations    PT Next Visit Plan Review initial HEP; cervical and postural stretching; gentle cervical ROM; postural strengthening, manual therapy as indicated; vestibular rehab PRN    PT Home Exercise Plan Access Code: [email or web address] (9/8)    Consulted and Agree with Plan of Care Patient             Patient will benefit from skilled therapeutic intervention in order to improve the following deficits and impairments:  Decreased activity tolerance, Decreased knowledge of precautions, Decreased range of motion, Decreased strength, Increased edema, Increased fascial restricitons, Increased muscle spasms, Impaired perceived functional ability, Impaired flexibility, Impaired UE functional use, Improper body mechanics, Postural dysfunction, Pain  Visit Diagnosis: Cervicalgia  Radiculopathy, cervical region  Muscle weakness (generalized)  Other muscle spasm  Abnormal posture  Dizziness and giddiness     Problem List Patient Active Problem List   Diagnosis Date Noted   Dizziness 09/24/2020   AMS (altered mental status) 09/24/2020    Cerebellar infarct ([place]) 06/23/2019   Status post total knee replacement, 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  [place], [place], [address] Phone: [phone]   Fax:  [phone]  Name: Terri Waters MRN: [id number] Date of Birth: [birth year]

## 2020-10-17 ENCOUNTER — Ambulatory Visit: Payer: 59 | Admitting: Physical Therapy

## 2020-10-17 ENCOUNTER — Other Ambulatory Visit: Payer: Self-pay

## 2020-10-17 ENCOUNTER — Encounter: Payer: Self-pay | Admitting: Physical Therapy

## 2020-10-17 DIAGNOSIS — M6281 Muscle weakness (generalized): Secondary | ICD-10-CM

## 2020-10-17 DIAGNOSIS — R2681 Unsteadiness on feet: Secondary | ICD-10-CM

## 2020-10-17 DIAGNOSIS — R293 Abnormal posture: Secondary | ICD-10-CM

## 2020-10-17 DIAGNOSIS — M542 Cervicalgia: Secondary | ICD-10-CM | POA: Diagnosis not present

## 2020-10-17 DIAGNOSIS — M62838 Other muscle spasm: Secondary | ICD-10-CM

## 2020-10-17 DIAGNOSIS — R42 Dizziness and giddiness: Secondary | ICD-10-CM

## 2020-10-17 DIAGNOSIS — M5412 Radiculopathy, cervical region: Secondary | ICD-10-CM

## 2020-10-17 NOTE — Therapy (Signed)
[facility] [address]  [place], [place], [address] Phone: [phone]   Fax:  [phone]  Physical Therapy Treatment  Patient Details  Name: Terri Waters MRN: [id number] Date of Birth: 21-Apr-1955 Referring Provider (PT): Phillips Climes   Encounter Date: 10/17/2020   PT End of Session - 10/17/20 1535     Visit Number 5    Number of Visits 16    Date for PT Re-Evaluation 11/15/20    Authorization Type UHC    PT Start Time 1532    PT Stop Time 6599    PT Time Calculation (min) 43 min    Activity Tolerance Patient tolerated treatment well;Patient limited by pain    Behavior During Therapy [facility] for tasks assessed/performed             Past Medical History:  Diagnosis Date   Cancer ([place])    papillary carcinoma   Diabetes mellitus without complication ([place])    High cholesterol    Hypertension    Hypothyroidism    Migraines    Stroke [facility])    Thyroid disease    Ventricular tachycardia     Past Surgical History:  Procedure Laterality Date   ABDOMINAL HYSTERECTOMY     BREAST SURGERY     lumps removed - over 20 years ago   THYROIDECTOMY     TOTAL KNEE ARTHROPLASTY Right 12/18/2017   Procedure: RIGHT TOTAL KNEE ARTHROPLASTY;  Surgeon: Netta Cedars, MD;  Location: [facility];  Service: Orthopedics;  Laterality: Right;    There were no vitals filed for this visit.   Subjective Assessment - 10/17/20 1530     Subjective Pt. reports no dizziness and neck and headache are better.  Got a new pillow and that seems to help. Fingers are still numb both sides, R more than left.  She suspects that she does have carpal tunnel and plans to contact neurologist to move nerve conduction testing up.    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 No/denies    Pain Score 0-No pain    Pain Location Neck                               [facility] Adult PT Treatment/Exercise - 10/17/20 0001       Neck Exercises: Machines for Strengthening   UBE (Upper Arm Bike) L1.5 x 6 min (3' fwd/3' back)      Hand Exercises for Cervical Radiculopathy   Other Hand Exercise for Cervical Radiculopathy median nerve glides and wrists stretches - noted all exercises other than wrist circles aggravated numbness in hands so discontinued      Manual Therapy   Manual Therapy Soft tissue mobilization;Myofascial release;Neural Stretch;Joint mobilization    Manual therapy comments sitting to decrease muscle spasm and radiculary symptoms.    Joint Mobilization 1st rib mobs    Soft tissue mobilization IASTM to R forearm flexors, R flexor retinaculum    Myofascial Release manual TPR to R UT, levator, and R supraspinatus                       PT Short Term Goals -  10/01/20 1639       PT SHORT TERM GOAL #1   Title Patient will be independent with initial HEP    Status On-going    Target Date 10/18/20               PT Long Term Goals - 10/17/20 1701       PT LONG TERM GOAL #1   Title Patient will be independent with ongoing/advanced HEP for self-management at home    Status On-going      PT LONG TERM GOAL #2   Title Improve posture and alignment with patient to demonstrate improved upright posture with posterior shoulder girdle engaged    Status On-going      PT LONG TERM GOAL #3   Title Patient to demonstrate cervical AROM WFL and without pain limiting    Status On-going      PT LONG TERM GOAL #4   Title Patient to report >/= 50-70% improvement in night pain to reduce sleep disturbance    Status On-going      PT LONG TERM GOAL #5   Title Patient to demonstrate B UE strength >/= 4+/5 and grip strength at least 20lbs    Status On-going      PT LONG TERM GOAL #6    Title Pt. will report 50% improvement in dizziness symptoms.    Baseline brief mild symptoms but on daily basis    Status On-going   10/17/2020 - reports no dizziness symptoms x 1 week.                  Plan - 10/17/20 1536     Clinical Impression Statement Patient reports dizziness has not returned, neck pain is also better but continues to have numbness/tingling in both hands.    Comorbidities stroke, migraines, hypothyroidism, HTN, HLD, DM, hx breast CA, R TKA    Rehab Potential Good    PT Frequency 2x / week    PT Duration 8 weeks   6-8 weeks   PT Treatment/Interventions ADLs/Self Care Home Management;Cryotherapy;Electrical Stimulation;Iontophoresis 4mg /ml Dexamethasone;Moist Heat;Traction;Ultrasound;Functional mobility training;Therapeutic activities;Therapeutic exercise;Neuromuscular re-education;Patient/family education;Manual techniques;Passive range of motion;Dry needling;Energy conservation;Taping;Spinal Manipulations    PT Next Visit Plan Review initial HEP; cervical and postural stretching; gentle cervical ROM; postural strengthening, manual therapy as indicated; vestibular rehab PRN    PT Home Exercise Plan Access Code: [email or web address] (9/8)    Consulted and Agree with Plan of Care Patient             Patient will benefit from skilled therapeutic intervention in order to improve the following deficits and impairments:  Decreased activity tolerance, Decreased knowledge of precautions, Decreased range of motion, Decreased strength, Increased edema, Increased fascial restricitons, Increased muscle spasms, Impaired perceived functional ability, Impaired flexibility, Impaired UE functional use, Improper body mechanics, Postural dysfunction, Pain  Visit Diagnosis: Cervicalgia  Radiculopathy, cervical region  Muscle weakness (generalized)  Other muscle spasm  Abnormal posture  Dizziness and giddiness  Unsteadiness on feet     Problem List Patient Active Problem  List   Diagnosis Date Noted   Dizziness 09/24/2020   AMS (altered mental status) 09/24/2020   Cerebellar infarct ([place]) 06/23/2019   Status post total knee replacement, 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

## 2020-10-22 ENCOUNTER — Encounter: Payer: Self-pay | Admitting: Physical Therapy

## 2020-10-22 ENCOUNTER — Other Ambulatory Visit: Payer: Self-pay

## 2020-10-22 ENCOUNTER — Ambulatory Visit: Payer: 59 | Admitting: Physical Therapy

## 2020-10-22 DIAGNOSIS — M542 Cervicalgia: Secondary | ICD-10-CM | POA: Diagnosis not present

## 2020-10-22 DIAGNOSIS — M6281 Muscle weakness (generalized): Secondary | ICD-10-CM

## 2020-10-22 DIAGNOSIS — R293 Abnormal posture: Secondary | ICD-10-CM

## 2020-10-22 DIAGNOSIS — R42 Dizziness and giddiness: Secondary | ICD-10-CM

## 2020-10-22 DIAGNOSIS — M5412 Radiculopathy, cervical region: Secondary | ICD-10-CM

## 2020-10-22 DIAGNOSIS — M62838 Other muscle spasm: Secondary | ICD-10-CM

## 2020-10-22 NOTE — Therapy (Signed)
[facility] [address]  [place], [place], [address] Phone: [phone]   Fax:  [phone]  Physical Therapy Treatment  Patient Details  Name: Terri Waters MRN: [id number] Date of Birth: Mar 08, 1955 Referring Provider (PT): Phillips Climes   Encounter Date: 10/22/2020   PT End of Session - 10/22/20 1538     Visit Number 6    Number of Visits 16    Date for PT Re-Evaluation 11/15/20    Authorization Type UHC    PT Start Time 9767    PT Stop Time 1619    PT Time Calculation (min) 41 min    Activity Tolerance Patient tolerated treatment well;Patient limited by pain    Behavior During Therapy [facility] for tasks assessed/performed             Past Medical History:  Diagnosis Date   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   Subjective Assessment - 10/22/20 1542     Subjective Pt reports she has not had the opporunity to look into the wrist splints recommended last visit as she worked 25 hrs of overtime over the weekend. No pain today but noting some numbness in the tips of her fingers. Did have an episode on Sat while working where her L ring finger muscles spasmed drawing her finger across her palm and locked into position where she had to manually reset it.    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 No/denies                               [facility] Adult PT Treatment/Exercise - 10/22/20 1538       Neck Exercises: Machines for Strengthening   UBE (Upper Arm Bike) L2.0 x 6 min (3' fwd/3' back)      Neck Exercises: Stretches   Other Neck Stretches R/L ant/mid/post scalene stretches with towel over shoulder to avoid shoulder shrug 2 x 30 sec each      Hand Exercises for Cervical Radiculopathy   Other Hand Exercise for Cervical Radiculopathy R/L median nerve glide with arm at side x 10 - pt reporting slight centralization of numbness from fingers into palm of hand      Shoulder Exercises: Prone   Extension Both;10 reps;AROM;Strengthening    Extension Limitations I's - standing leaning over orange Pball on mat table - cues for scap retraction + slight thoracic extension    External Rotation Both;10 reps;AROM;Strengthening    External Rotation Limitations W's -  standing leaning over orange Pball on mat table - cues for scap retraction + slight thoracic extension    Horizontal ABduction 1 Both;10 reps;AROM;Strengthening    Horizontal ABduction 1 Limitations T's - standing leaning over orange Pball on mat table - cues for scap retraction + slight thoracic extension    Horizontal ABduction 2 Both;10 reps;AROM    Horizontal ABduction 2 Limitations Y's - standing leaning over orange Pball on mat table - cues for scap retraction + slight thoracic extension      Manual Therapy   Manual Therapy Soft tissue mobilization;Myofascial release;Joint mobilization    Joint Mobilization 1st rib mobs    Soft tissue mobilization STM to B UT, LS & scalenes    Myofascial Release manual TPR to R>L UT, LS & scalenes                       PT Short Term Goals - 10/01/20 1639       PT SHORT TERM GOAL #1   Title  Patient will be independent with initial HEP    Status On-going    Target Date 10/18/20               PT Long Term Goals - 10/17/20 1701       PT LONG TERM GOAL #1   Title Patient will be independent with ongoing/advanced HEP for self-management at home    Status On-going      PT LONG TERM GOAL #2   Title Improve posture and alignment with patient to demonstrate improved upright posture with posterior shoulder girdle engaged    Status On-going      PT LONG TERM GOAL #3   Title Patient to demonstrate cervical AROM WFL and without pain limiting    Status On-going      PT LONG TERM GOAL #4   Title Patient to report >/= 50-70% improvement in night pain to reduce sleep disturbance    Status On-going      PT LONG TERM GOAL #5   Title Patient to demonstrate B UE strength >/= 4+/5 and grip strength at least 20lbs    Status On-going      PT LONG TERM GOAL #6   Title Pt. will report 50% improvement in dizziness symptoms.    Baseline brief mild symptoms but on daily basis    Status On-going   10/17/2020 - reports no dizziness symptoms x 1 week.                  Plan - 10/22/20 1547     Clinical Impression Statement Terri Waters opting not to proceed with DN today, feeling like things are progressing well enough with current therapeutic exercises and activities. She notes greatest benefit from postural stretches and strengthening, therefore progressed postural training with prone I's, T's, W's and Y's over orange Pball - pt noting fatigue but otherwise well tolerated. Pt reporting greatest tension/tightness at base of neck in scalenes, therefore guided her in positional adjustments to target ant/mid/post scalenes using towel over shoulder to anchor shoulder to avoid shrug/elevation. Revisited median nerve glides with pt able to perform glide with neutral shoulder and noting some mild centralization on numbness from fingers into palm. Pt noting some mild increase in neck soreness by  end of session but opting to apply heat at home rather than as part of therapy session.    Comorbidities stroke, migraines, hypothyroidism, HTN, HLD, DM, hx breast CA, R TKA  Rehab Potential Good    PT Frequency 2x / week    PT Duration 8 weeks   6-8 weeks   PT Treatment/Interventions ADLs/Self Care Home Management;Cryotherapy;Electrical Stimulation;Iontophoresis 4mg /ml Dexamethasone;Moist Heat;Traction;Ultrasound;Functional mobility training;Therapeutic activities;Therapeutic exercise;Neuromuscular re-education;Patient/family education;Manual techniques;Passive range of motion;Dry needling;Energy conservation;Taping;Spinal Manipulations    PT Next Visit Plan cervical and postural stretching; gentle cervical ROM; postural strengthening, review & HEP as indicated; manual therapy as indicated; vestibular rehab PRN    PT Home Exercise Plan Access Code: [email or web address] (9/8), vestibular - [email or web address] (9/27)    Consulted and Agree with Plan of Care Patient             Patient will benefit from skilled therapeutic intervention in order to improve the following deficits and impairments:  Decreased activity tolerance, Decreased knowledge of precautions, Decreased range of motion, Decreased strength, Increased edema, Increased fascial restricitons, Increased muscle spasms, Impaired perceived functional ability, Impaired flexibility, Impaired UE functional use, Improper body mechanics, Postural dysfunction, Pain  Visit Diagnosis: Cervicalgia  Radiculopathy, cervical region  Muscle weakness (generalized)  Other muscle spasm  Abnormal posture  Dizziness and giddiness     Problem List Patient Active Problem List   Diagnosis Date Noted   Dizziness 09/24/2020   AMS (altered mental status) 09/24/2020   Cerebellar infarct ([place]) 06/23/2019   Status post total knee replacement, 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

## 2020-10-25 ENCOUNTER — Other Ambulatory Visit: Payer: Self-pay

## 2020-10-25 ENCOUNTER — Ambulatory Visit: Payer: 59

## 2020-10-25 DIAGNOSIS — R293 Abnormal posture: Secondary | ICD-10-CM

## 2020-10-25 DIAGNOSIS — M6281 Muscle weakness (generalized): Secondary | ICD-10-CM

## 2020-10-25 DIAGNOSIS — M5412 Radiculopathy, cervical region: Secondary | ICD-10-CM

## 2020-10-25 DIAGNOSIS — M542 Cervicalgia: Secondary | ICD-10-CM

## 2020-10-25 DIAGNOSIS — M62838 Other muscle spasm: Secondary | ICD-10-CM

## 2020-10-25 DIAGNOSIS — R42 Dizziness and giddiness: Secondary | ICD-10-CM

## 2020-10-25 NOTE — Therapy (Signed)
[facility] [address]  [place], [place], [address] Phone: [phone]   Fax:  [phone]  Physical Therapy Treatment  Patient Details  Name: Terri Waters MRN: [id number] Date of Birth: 1955-03-05 Referring Provider (PT): Phillips Climes   Encounter Date: 10/25/2020   PT End of Session - 10/25/20 1539     Visit Number 7    Number of Visits 16    Date for PT Re-Evaluation 11/15/20    Authorization Type UHC    PT Start Time 1448    PT Stop Time 1749    PT Time Calculation (min) 42 min    Activity Tolerance Patient tolerated treatment well;Patient limited by pain    Behavior During Therapy [facility] for tasks assessed/performed             Past Medical History:  Diagnosis Date   Cancer ([place])    papillary carcinoma   Diabetes mellitus without complication ([place])    High cholesterol    Hypertension    Hypothyroidism    Migraines    Stroke [facility])    Thyroid disease    Ventricular tachycardia     Past Surgical History:  Procedure Laterality Date   ABDOMINAL HYSTERECTOMY     BREAST SURGERY     lumps removed - over 20 years ago   THYROIDECTOMY     TOTAL KNEE ARTHROPLASTY Right 12/18/2017   Procedure: RIGHT TOTAL KNEE ARTHROPLASTY;  Surgeon: Netta Cedars, MD;  Location: [place];  Service: Orthopedics;  Laterality: Right;    There were no vitals filed for this visit.   Subjective Assessment - 10/25/20 1453     Subjective Pt went to buy a hand brace the other day and it hurt her hand worse. Pt still notes numbness in her L hand at times.    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 No/denies                               [facility] Adult PT Treatment/Exercise - 10/25/20 0001        Neck Exercises: Machines for Strengthening   UBE (Upper Arm Bike) L2.0 x 6 min (3' fwd/3' back)      Neck Exercises: Theraband   Shoulder Extension 20 reps;Red    Shoulder Extension Limitations 2x10    Rows 20 reps;Red    Rows Limitations 2x10    Shoulder External Rotation 20 reps;Red      Neck Exercises: Standing   Wall Push Ups 10 reps    Wall Push Ups Limitations cues for proper technique      Neck Exercises: Stretches   Upper Trapezius Stretch Right;Left;2 reps;30 seconds    Upper Trapezius Stretch Limitations seated    Levator Stretch Right;Left;2 reps;30 seconds    Levator Stretch Limitations seated    Chest Stretch 2 reps;30 seconds    Chest Stretch Limitations low pec doorway stretch      Hand Exercises for Cervical Radiculopathy   Other Hand Exercise for Cervical Radiculopathy R/L median nerve glide with arm at side 3 x 10      Shoulder Exercises: Standing   Flexion AAROM;Both;10 reps  Flexion Limitations standing with ball on wall                       PT Short Term Goals - 10/25/20 1531       PT SHORT TERM GOAL #1   Title Patient will be independent with initial HEP    Status Achieved (10/25/2020)   Target Date 10/18/20               PT Long Term Goals - 10/17/20 1701       PT LONG TERM GOAL #1   Title Patient will be independent with ongoing/advanced HEP for self-management at home    Status On-going      PT LONG TERM GOAL #2   Title Improve posture and alignment with patient to demonstrate improved upright posture with posterior shoulder girdle engaged    Status On-going      PT LONG TERM GOAL #3   Title Patient to demonstrate cervical AROM WFL and without pain limiting    Status On-going      PT LONG TERM GOAL #4   Title Patient to report >/= 50-70% improvement in night pain to reduce sleep disturbance    Status On-going      PT LONG TERM GOAL #5   Title Patient to demonstrate B UE strength >/= 4+/5 and grip  strength at least 20lbs    Status On-going      PT LONG TERM GOAL #6   Title Pt. will report 50% improvement in dizziness symptoms.    Baseline brief mild symptoms but on daily basis    Status On-going   10/17/2020 - reports no dizziness symptoms x 1 week.                  Plan - 10/25/20 1540     Clinical Impression Statement Pt noted not much improvements from buying wrist splint the other day and reported painwith wearing it. We did median nerve glides intermittently throughout the session to relieve radicular symptoms. Progressed postural strengthening and stretching, she was unable to do 90 deg position pec stretch d/t report of radicular symptoms but low pec stretch better tolerated. She required a lot of cues with the wall push ups for scapular protraction. She noted pain with trying resisted horizontal abduction with TB but all other exercises well tolerated.    Personal Factors and Comorbidities Age;Comorbidity 3+;Past/Current Experience;Profession;Time since onset of injury/illness/exacerbation    Comorbidities stroke, migraines, hypothyroidism, HTN, HLD, DM, hx breast CA, R TKA    PT Frequency 2x / week    PT Duration 8 weeks    PT Treatment/Interventions ADLs/Self Care Home Management;Cryotherapy;Electrical Stimulation;Iontophoresis 4mg /ml Dexamethasone;Moist Heat;Traction;Ultrasound;Functional mobility training;Therapeutic activities;Therapeutic exercise;Neuromuscular re-education;Patient/family education;Manual techniques;Passive range of motion;Dry needling;Energy conservation;Taping;Spinal Manipulations    PT Next Visit Plan cervical and postural stretching; gentle cervical ROM; postural strengthening, review & HEP as indicated; manual therapy as indicated; vestibular rehab PRN    PT Home Exercise Plan Access Code: [email or web address] (9/8), vestibular - [email or web address] (9/27)    Consulted and Agree with Plan of Care Patient             Patient will benefit from skilled therapeutic  intervention in order to improve the following deficits and impairments:  Decreased activity tolerance, Decreased knowledge of precautions, Decreased range of motion, Decreased strength, Increased edema, Increased fascial restricitons, Increased muscle spasms, Impaired perceived functional ability, Impaired flexibility, Impaired UE functional use, Improper body mechanics, Postural dysfunction, Pain  Visit  Diagnosis: Cervicalgia  Radiculopathy, cervical region  Muscle weakness (generalized)  Other muscle spasm  Abnormal posture  Dizziness and giddiness     Problem List Patient Active Problem List   Diagnosis Date Noted   Dizziness 09/24/2020   AMS (altered mental status) 09/24/2020   Cerebellar infarct ([place]) 06/23/2019   Status post total knee replacement, 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  [place], [place], [address] Phone: [phone]   Fax:  [phone]  Name: Terri Waters MRN: [id number] Date of Birth: Oct 07, 1955

## 2020-10-29 ENCOUNTER — Ambulatory Visit: Payer: 59 | Admitting: Physical Therapy

## 2020-11-01 ENCOUNTER — Other Ambulatory Visit: Payer: Self-pay

## 2020-11-01 ENCOUNTER — Encounter: Payer: Self-pay | Admitting: Physical Therapy

## 2020-11-01 ENCOUNTER — Ambulatory Visit: Payer: 59 | Admitting: Physical Therapy

## 2020-11-01 DIAGNOSIS — M5412 Radiculopathy, cervical region: Secondary | ICD-10-CM

## 2020-11-01 DIAGNOSIS — M542 Cervicalgia: Secondary | ICD-10-CM

## 2020-11-01 DIAGNOSIS — R293 Abnormal posture: Secondary | ICD-10-CM

## 2020-11-01 DIAGNOSIS — R42 Dizziness and giddiness: Secondary | ICD-10-CM

## 2020-11-01 DIAGNOSIS — M62838 Other muscle spasm: Secondary | ICD-10-CM

## 2020-11-01 DIAGNOSIS — M6281 Muscle weakness (generalized): Secondary | ICD-10-CM

## 2020-11-01 NOTE — Therapy (Signed)
[facility] [address]  [place], [place], [address] Phone: [phone]   Fax:  [phone]  Physical Therapy Treatment  Patient Details  Name: Terri Waters MRN: [id number] Date of Birth: [birth year] Referring Provider (PT): Elgergawy, Emeline Gins   Encounter Date: 11/01/2020   PT End of Session - 11/01/20 1544     Visit Number 8    Number of Visits 16    Date for PT Re-Evaluation 11/15/20    Authorization Type UHC    PT Start Time 9485    PT Stop Time 4627    PT Time Calculation (min) 45 min    Activity Tolerance Patient tolerated treatment well;Patient limited by pain    Behavior During Therapy [facility] for tasks assessed/performed             Past Medical History:  Diagnosis Date   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   Subjective Assessment - 11/01/20 1448     Subjective Pt. reports waking up Tuesday morning with neck pain "crick in the neck" then Tuesday night started having trouble laying down in bed, got really sick and throwing up.  Took some dramamine which helped as long as sitting up. Didn't have a headache.  Called doctor who prescribed meclizine. Feeling better today.  Supposed to have NCV test but machine broke and appt. cancelled.    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 No/denies   still has numbness and tingling in hands.                    Vestibular Assessment - 11/01/20 0001       Positional Testing   Dix-Hallpike Dix-Hallpike Right;Dix-Hallpike Left    Horizontal Canal Testing Horizontal Canal Right;Horizontal Canal Left      Dix-Hallpike Right   Dix-Hallpike Right Duration negative    Dix-Hallpike Right Symptoms No nystagmus      Dix-Hallpike Left   Dix-Hallpike Left Duration negative    Dix-Hallpike Left Symptoms No nystagmus      Horizontal Canal Right   Horizontal Canal Right Duration negative    Horizontal Canal Right Symptoms Normal      Horizontal Canal Left   Horizontal Canal Left Duration negative    Horizontal Canal Left Symptoms Normal                      [facility] Adult PT Treatment/Exercise - 11/01/20 0001       Shoulder Exercises: ROM/Strengthening   UBE (Upper Arm Bike) L1  x 6 min (66f/3b)      Manual Therapy   Manual Therapy Soft tissue mobilization;Myofascial release;Joint mobilization;Manual Traction    Manual therapy comments in supine to decrease muscle pain/tightness and symptoms.    Joint Mobilization 1st rib mobs    Soft tissue mobilization STM to B UT, LS & scalenes    Myofascial Release manual TPR to R>L UT, LS & scalenes    Passive ROM UPA mobs to cervical spine, NAGs into rotation, SB    Manual Traction gentle cervical distraction with prolonged holds - no increase in symptoms.                       PT Short Term Goals - 10/25/20 1531       PT SHORT TERM GOAL #1   Title Patient will be independent with initial HEP    Status Achieved    Target Date 10/18/20               PT Long Term Goals - 10/17/20 1701       PT LONG TERM GOAL #1   Title Patient will be independent with ongoing/advanced HEP for  self-management at home    Status On-going      PT LONG TERM GOAL #2   Title Improve posture and alignment with patient to demonstrate improved upright posture with posterior shoulder girdle engaged    Status On-going      PT LONG TERM GOAL #3   Title Patient to demonstrate cervical AROM WFL and without pain limiting    Status On-going      PT LONG TERM GOAL #4   Title Patient to report >/= 50-70% improvement in night pain to reduce sleep disturbance    Status On-going      PT LONG TERM GOAL #5   Title Patient to demonstrate B UE strength >/= 4+/5 and grip strength at least 20lbs    Status On-going      PT LONG TERM GOAL #6   Title Pt. will report 50% improvement in dizziness symptoms.    Baseline brief mild symptoms but on daily basis    Status On-going   10/17/2020 - reports no dizziness symptoms x 1 week.                  Plan - 11/01/20 1545     Clinical Impression Statement Patient reported another episode of severe vertigo on Tuesday, has started taking meclizine.  Tested for BPPV today but negative with both dix hallpike and roll test, for both nystagmus and symptoms, although the meclizine may be suppressing symptoms.  She did have significant trigger points throughout cervical musculature, she responded well to manual therapy today without any aggravation of her radicular symptoms.  We discussed dry needling for trigger points but she declined.  She would benefit from continued skilled physical therapy.    Personal Factors and Comorbidities Age;Comorbidity 3+;Past/Current Experience;Profession;Time since onset of injury/illness/exacerbation    Comorbidities stroke, migraines, hypothyroidism, HTN, HLD, DM, hx breast CA, R TKA    PT Frequency 2x / week    PT Duration 8 weeks    PT Treatment/Interventions ADLs/Self Care Home Management;Cryotherapy;Electrical Stimulation;Iontophoresis 4mg /ml Dexamethasone;Moist Heat;Traction;Ultrasound;Functional mobility  training;Therapeutic activities;Therapeutic exercise;Neuromuscular re-education;Patient/family education;Manual techniques;Passive range of motion;Dry needling;Energy conservation;Taping;Spinal Manipulations    PT Next Visit Plan cervical and postural stretching; gentle cervical ROM; postural strengthening, review & HEP as indicated; manual therapy as indicated; vestibular rehab PRN    PT Home  Exercise Plan Access Code: [email or web address] (9/8), vestibular - [email or web address] (9/27)    Consulted and Agree with Plan of Care Patient             Patient will benefit from skilled therapeutic intervention in order to improve the following deficits and impairments:  Decreased activity tolerance, Decreased knowledge of precautions, Decreased range of motion, Decreased strength, Increased edema, Increased fascial restricitons, Increased muscle spasms, Impaired perceived functional ability, Impaired flexibility, Impaired UE functional use, Improper body mechanics, Postural dysfunction, Pain  Visit Diagnosis: Cervicalgia  Radiculopathy, cervical region  Muscle weakness (generalized)  Other muscle spasm  Abnormal posture  Dizziness and giddiness     Problem List Patient Active Problem List   Diagnosis Date Noted   Dizziness 09/24/2020   AMS (altered mental status) 09/24/2020   Cerebellar infarct ([place]) 06/23/2019   Status post total knee replacement, 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  [place], [place], [address] Phone: [phone]   Fax:  [phone]  Name: Terri Waters MRN: [id number] Date of Birth: [birth year]

## 2020-11-05 ENCOUNTER — Ambulatory Visit: Payer: 59 | Admitting: Physical Therapy

## 2020-11-05 ENCOUNTER — Encounter: Payer: Self-pay | Admitting: Physical Therapy

## 2020-11-05 ENCOUNTER — Other Ambulatory Visit: Payer: Self-pay

## 2020-11-05 DIAGNOSIS — M6281 Muscle weakness (generalized): Secondary | ICD-10-CM

## 2020-11-05 DIAGNOSIS — M5412 Radiculopathy, cervical region: Secondary | ICD-10-CM

## 2020-11-05 DIAGNOSIS — M62838 Other muscle spasm: Secondary | ICD-10-CM

## 2020-11-05 DIAGNOSIS — R42 Dizziness and giddiness: Secondary | ICD-10-CM

## 2020-11-05 DIAGNOSIS — M542 Cervicalgia: Secondary | ICD-10-CM | POA: Diagnosis not present

## 2020-11-05 DIAGNOSIS — R293 Abnormal posture: Secondary | ICD-10-CM

## 2020-11-05 DIAGNOSIS — R2681 Unsteadiness on feet: Secondary | ICD-10-CM

## 2020-11-05 NOTE — Therapy (Addendum)
[facility] [address]  [place], [place], [address] Phone: [phone]   Fax:  [phone]  Physical Therapy Treatment / Progress Note / Discharge Summary  Patient Details  Name: Terri Waters MRN: [id number] Date of Birth: 06-06-1955 Referring Provider (PT): Melina Schools, MD (cervicalgia) & Phillips Climes, MD (vertigo)  Progress Note  Reporting Period 09/20/2020 to 11/05/2020  See note below for Objective Data and Assessment of Progress/Goals.     Encounter Date: 11/05/2020   PT End of Session - 11/05/20 1449     Visit Number 9    Number of Visits 16    Date for PT Re-Evaluation 11/15/20    Authorization Type UHC    PT Start Time 7622    PT Stop Time 1527    PT Time Calculation (min) 38 min    Activity Tolerance Patient tolerated treatment well    Behavior During Therapy [facility] for tasks assessed/performed             Past Medical History:  Diagnosis Date   Cancer ([place])    papillary carcinoma   Diabetes mellitus without complication ([place])    High cholesterol    Hypertension    Hypothyroidism    Migraines    Stroke ([place])    Thyroid disease    Ventricular tachycardia     Past Surgical History:  Procedure Laterality Date   ABDOMINAL HYSTERECTOMY     BREAST SURGERY     lumps removed - over 20 years ago   THYROIDECTOMY     TOTAL KNEE ARTHROPLASTY Right 12/18/2017   Procedure: RIGHT TOTAL KNEE ARTHROPLASTY;  Surgeon: Netta Cedars, MD;  Location: [place];  Service: Orthopedics;  Laterality: Right;    There were no vitals filed for this visit.   Subjective Assessment - 11/05/20 1452     Subjective Pt reports she had a great weekend (depsite working all weekend) and doing well today - no pain or recent dizziness. Pt reports she is currently scheduled for the ESI this Friday but is consdering cancelling it as she is no longer having any pain (has to decide by tomorrow). She has an appt with the  neurologist tomorrow for what was supposed to be the f/u from her NCV that got postponed - has called the MD to see if she needs to reschedule.    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 No/denies                [facility] PT Assessment - 11/05/20 1449       Assessment   Medical Diagnosis Cervicalgia; Vertigo; Dizziness    Referring Provider (PT) Melina Schools, MD (cervicalgia) & Phillips Climes, MD (vertigo)    Onset Date/Surgical Date --   April/May 2022 for cervicalgia; 09/24/20 for vertigo     Observation/Other Assessments   Focus on Therapeutic Outcomes (FOTO)  Neck = 79 (26 point improvement from eval)      AROM   Overall AROM Comments B shoulders WFL/WNL other than slighty limited FIR of R>L shoulder    Cervical Flexion 58    Cervical Extension 47    Cervical - Right Side Bend 34    Cervical - Left Side Bend  38    Cervical - Right Rotation 58    Cervical - Left Rotation 56      Strength   Right Shoulder Flexion 5/5    Right Shoulder ABduction 5/5    Right Shoulder Internal Rotation 5/5    Right Shoulder External Rotation 5/5    Left Shoulder Flexion 5/5    Left Shoulder ABduction 5/5    Left Shoulder Internal Rotation 5/5    Left Shoulder External Rotation 5/5    Right Hand Grip (lbs) 37.67   40, 35, 38   Left Hand Grip (lbs) 31.67   35, 30. 30                          [facility] Adult PT Treatment/Exercise - 11/05/20 1449       Neck Exercises: Machines for Strengthening   UBE (Upper Arm Bike) L2.0 x 6 min (3' fwd/3' back)                       PT Short Term Goals - 10/25/20 1531       PT SHORT TERM GOAL #1   Title Patient will be independent with initial HEP    Status Achieved    Target Date 10/18/20                PT Long Term Goals - 11/05/20 1449       PT LONG TERM GOAL #1   Title Patient will be independent with ongoing/advanced HEP for self-management at home    Status Achieved    Target Date --      PT LONG TERM GOAL #2   Title Improve posture and alignment with patient to demonstrate improved upright posture with posterior shoulder girdle engaged    Status Achieved   11/05/20     PT LONG TERM GOAL #3   Title Patient to demonstrate cervical AROM WFL and without pain limiting    Status Achieved   11/05/20     PT LONG TERM GOAL #4   Title Patient to report >/= 50-70% improvement in night pain to reduce sleep disturbance    Status Achieved   11/05/20 - 90% improvement night pain (typically only 1x/wk at most), although still has some intermittent numbness     PT LONG TERM GOAL #5   Title Patient to demonstrate B UE strength >/= 4+/5 and grip strength at least 20lbs    Status Achieved   11/05/20     PT LONG TERM GOAL #6   Title Pt. will report 50% improvement in dizziness symptoms.    Baseline brief mild symptoms but on daily basis    Status Achieved   11/05/20 - pt reports 75-80% improvement in dizziness, noting only occasional incidents every few weeks                  Plan - 11/05/20 1521     Clinical Impression Statement Terri Waters is very pleased with her progress, reporting no recent pain and decreasing numbness and tingling. Vertigo/dizziness has not recurred since last week and she feels that she and her husband can manage it with the Epley maneuver at home PRN. She reports improved awareness of her posture and has purchased a new desk chair for her work which she feels has helped with her posture. Cervical and B shoulder ROM now WFL/WNL and B shoulder strength now 5/5 with B grip strength improved and more symmetrical  with slightly stronger dominant hand as expected. All goals now met and pt feels confident with transitioning to her HEP at this time, denying need for  further review today. Will proceed with transition to HEP with pt to remain on hold for 30-days in the event of a need to return to PT for a recurrence of her vertigo.    Comorbidities stroke, migraines, hypothyroidism, HTN, HLD, DM, hx breast CA, R TKA    Rehab Potential Good    PT Treatment/Interventions ADLs/Self Care Home Management;Cryotherapy;Electrical Stimulation;Iontophoresis 29m/ml Dexamethasone;Moist Heat;Traction;Ultrasound;Functional mobility training;Therapeutic activities;Therapeutic exercise;Neuromuscular re-education;Patient/family education;Manual techniques;Passive range of motion;Dry needling;Energy conservation;Taping;Spinal Manipulations    PT Next Visit Plan transition to HEP + 30-day hold    PT Home Exercise Plan Access Code: [email or web address] (9/8), vestibular - [email or web address] (9/27)    Consulted and Agree with Plan of Care Patient             Patient will benefit from skilled therapeutic intervention in order to improve the following deficits and impairments:  Decreased activity tolerance, Decreased knowledge of precautions, Decreased range of motion, Decreased strength, Increased edema, Increased fascial restricitons, Increased muscle spasms, Impaired perceived functional ability, Impaired flexibility, Impaired UE functional use, Improper body mechanics, Postural dysfunction, Pain  Visit Diagnosis: Cervicalgia  Radiculopathy, cervical region  Muscle weakness (generalized)  Other muscle spasm  Abnormal posture  Dizziness and giddiness  Unsteadiness on feet     Problem List Patient Active Problem List   Diagnosis Date Noted   Dizziness 09/24/2020   AMS (altered mental status) 09/24/2020   [place]bellar infarct ([place] 06/23/2019   Status post total knee replacement, 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 Patient was placed on hold for 30 days and has not needed to return to PT, therefore will proceed with discharge from PT for this episode.   Remaining deficits:   As above.   Education / Equipment:   HEP   Patient agrees to discharge. Patient goals were met. Patient is being discharged due to meeting the stated rehab goals.  JPercival Spanish PT, MPT 12/05/20, 8:20 AM  [facility] [place] [address]Phone: [phone]  Fax:  [phone]

## 2020-11-08 ENCOUNTER — Encounter: Payer: 59 | Admitting: Physical Therapy

## 2021-01-10 ENCOUNTER — Telehealth: Payer: Self-pay | Admitting: Internal Medicine

## 2021-01-10 NOTE — Telephone Encounter (Signed)
Hi Dr. Henrene Pastor,     D.O.D    Received a call from patient requesting to have a transfer of care from [facility] to [facility]. Patients reports from 2019 were received, there are also notes in Epic for you to review and advise on scheduling.     Thank you

## 2021-01-24 NOTE — Telephone Encounter (Signed)
Called and spoke with patient she understood.

## 2021-01-24 NOTE — Telephone Encounter (Signed)
Request received to transfer GI care from outside practice to [facility].  We appreciate the interest in our practice, however at this time due to high demand from patients without established GI providers we cannot accommodate this transfer.  Ability to accommodate future transfer requests may change over time and the patient can contact us again in 6-12 months if still interested in being seen at [facility].      °

## 2021-07-17 ENCOUNTER — Other Ambulatory Visit: Payer: Self-pay

## 2021-07-17 ENCOUNTER — Emergency Department (HOSPITAL_BASED_OUTPATIENT_CLINIC_OR_DEPARTMENT_OTHER): Payer: 59

## 2021-07-17 ENCOUNTER — Emergency Department (HOSPITAL_BASED_OUTPATIENT_CLINIC_OR_DEPARTMENT_OTHER)
Admission: EM | Admit: 2021-07-17 | Discharge: 2021-07-17 | Disposition: A | Discharge status: Home / Self Care | Payer: 59 | Attending: Emergency Medicine | Admitting: Emergency Medicine

## 2021-07-17 ENCOUNTER — Encounter (HOSPITAL_BASED_OUTPATIENT_CLINIC_OR_DEPARTMENT_OTHER): Payer: Self-pay | Admitting: Emergency Medicine

## 2021-07-17 DIAGNOSIS — R209 Unspecified disturbances of skin sensation: Secondary | ICD-10-CM | POA: Diagnosis present

## 2021-07-17 DIAGNOSIS — G43809 Other migraine, not intractable, without status migrainosus: Secondary | ICD-10-CM | POA: Diagnosis not present

## 2021-07-17 DIAGNOSIS — Z7982 Long term (current) use of aspirin: Secondary | ICD-10-CM | POA: Diagnosis not present

## 2021-07-17 DIAGNOSIS — I10 Essential (primary) hypertension: Secondary | ICD-10-CM | POA: Diagnosis not present

## 2021-07-17 DIAGNOSIS — Z79899 Other long term (current) drug therapy: Secondary | ICD-10-CM | POA: Insufficient documentation

## 2021-07-17 DIAGNOSIS — R200 Anesthesia of skin: Secondary | ICD-10-CM

## 2021-07-17 LAB — CBC WITH DIFFERENTIAL/PLATELET
Abs Immature Granulocytes: 0.02 10*3/uL (ref 0.00–0.07)
Basophils Absolute: 0 10*3/uL (ref 0.0–0.1)
Basophils Relative: 0 %
Eosinophils Absolute: 0.1 10*3/uL (ref 0.0–0.5)
Eosinophils Relative: 1 %
HCT: 35.4 % — ABNORMAL LOW (ref 36.0–46.0)
Hemoglobin: 11.7 g/dL — ABNORMAL LOW (ref 12.0–15.0)
Immature Granulocytes: 0 %
Lymphocytes Relative: 32 %
Lymphs Abs: 2.5 10*3/uL (ref 0.7–4.0)
MCH: 28.5 pg (ref 26.0–34.0)
MCHC: 33.1 g/dL (ref 30.0–36.0)
MCV: 86.3 fL (ref 80.0–100.0)
Monocytes Absolute: 0.6 10*3/uL (ref 0.1–1.0)
Monocytes Relative: 7 %
Neutro Abs: 4.6 10*3/uL (ref 1.7–7.7)
Neutrophils Relative %: 60 %
Platelets: 393 10*3/uL (ref 150–400)
RBC: 4.1 MIL/uL (ref 3.87–5.11)
RDW: 14.6 % (ref 11.5–15.5)
WBC: 7.8 10*3/uL (ref 4.0–10.5)
nRBC: 0 % (ref 0.0–0.2)

## 2021-07-17 LAB — BASIC METABOLIC PANEL
Anion gap: 7 (ref 5–15)
BUN: 11 mg/dL (ref 8–23)
CO2: 31 mmol/L (ref 22–32)
Calcium: 8.9 mg/dL (ref 8.9–10.3)
Chloride: 101 mmol/L (ref 98–111)
Creatinine, Ser: 0.91 mg/dL (ref 0.44–1.00)
GFR, Estimated: >60 mL/min (ref >60)
Glucose, Bld: 84 mg/dL (ref 70–99)
Potassium: 3.2 mmol/L — ABNORMAL LOW (ref 3.5–5.1)
Sodium: 139 mmol/L (ref 135–145)

## 2021-07-17 MED ORDER — PROCHLORPERAZINE EDISYLATE 10 MG/2ML IJ SOLN
10.0000 mg | Freq: Once | INTRAMUSCULAR | Status: AC
  Administered 2021-07-17: 10 mg via INTRAVENOUS
  Filled 2021-07-17: qty 2

## 2021-07-17 MED ORDER — ACETAMINOPHEN 325 MG PO TABS
650.0000 mg | ORAL_TABLET | Freq: Once | ORAL | Status: AC
  Administered 2021-07-17: 650 mg via ORAL
  Filled 2021-07-17: qty 2

## 2021-07-17 MED ORDER — SODIUM CHLORIDE 0.9 % IV BOLUS
1000.0000 mL | Freq: Once | INTRAVENOUS | Status: AC
  Administered 2021-07-17: 1000 mL via INTRAVENOUS

## 2021-07-17 MED ORDER — DIPHENHYDRAMINE HCL 50 MG/ML IJ SOLN
12.5000 mg | Freq: Once | INTRAMUSCULAR | Status: AC
  Administered 2021-07-17: 12.5 mg via INTRAVENOUS
  Filled 2021-07-17: qty 1

## 2021-07-17 NOTE — Discharge Instructions (Signed)
Follow-up with neurologist.

## 2021-07-17 NOTE — ED Triage Notes (Signed)
Pt states she woke up on 7/4 at 6 am with headache and facial numbness.  The last known normal is 12/3 around 1159.  Pt states her headache subsided yesterday but continues to have facial numbness, head fullness and doesn't "feel right".  Pt states her BP at home was elevated.  No weakness noted, no droop, no gait impairment, no aphasia.

## 2021-07-17 NOTE — ED Provider Notes (Signed)
[facility] Provider Note   CSN: [id number] Arrival date & time: 07/17/21  1660     History  Chief Complaint  Patient presents with   Numbness    Terri Waters is a 66 y.o. female.  Patient here with headache and numbness.  History of stroke, migraines, high cholesterol, diabetes, hypertension.  States that she started with a mild headache yesterday that subsided.  Felt like her head is congestion.  She is having some numbness of both sides of her face.  This morning she is noticing more numbness throughout the right side of her body.  No weakness or speech changes or vision changes.  She has had similar episode in the past that has been worked up by neurologist.  They got an MRI but a month ago that was unremarkable.  Suspect that neurologist thought that she might be having complex migraines versus stroke.  She was put on Plavix for a few weeks.  Maybe she has had some added stress but she has been sleeping okay.  No fever, chills, neck pain, chest pain, abdominal pain and no other major associated symptoms.  She had been on Topamax in the past as well as sumatriptan and other headache medications without much help.  Headache is fairly mild at this time.  She was concerned because her blood pressure is elevated to 206 when she woke up this morning as well.  The history is provided by the patient.       Home Medications Prior to Admission medications   Medication Sig Start Date End Date Taking? Authorizing Provider  Aspirin Buf,CaCarb-MgCarb-MgO, 81 MG TABS aspirin 81 mg tablet   81 mg by oral route. 12/18/17   [provider]  atorvastatin (LIPITOR) 40 MG tablet Take 40 mg by mouth daily.    [provider]  Cholecalciferol 125 MCG (5000 UT) capsule Take by mouth. 07/19/15   [provider]  conjugated estrogens (PREMARIN) vaginal cream INSERT 1/4 TH GRAM  VAGINALLY TWICE WEEKLY AS  DIRECTED 09/21/18   [provider]   cyanocobalamin (,VITAMIN B-12,) 1000 MCG/ML injection Inject 1,000 mcg as directed 4 (four) times a week. Start with daily injections for 4 days then weekly for 4 weeks. 06/28/19   [provider]  hydrochlorothiazide (HYDRODIURIL) 25 MG tablet Take 25 mg by mouth daily.    [provider]  levothyroxine (SYNTHROID) 88 MCG tablet Take 44-88 mcg by mouth See admin instructions. Take 1 tablet by mouth daily except on Sundays take 44 mcg per patient    [provider]  metFORMIN (GLUCOPHAGE-XR) 500 MG 24 hr tablet Take 2 tablets (1,000 mg total) by mouth 2 (two) times daily. Start after 2 days as he received IV contrast. 09/28/20   Elgergawy, Silver Huguenin, MD  olmesartan (BENICAR) 20 MG tablet Take 2 tablets by mouth daily. 07/04/19   [provider]  pantoprazole (PROTONIX) 40 MG tablet Take 40 mg by mouth 2 (two) times daily before a meal.    [provider]  potassium chloride SA (KLOR-CON) 20 MEQ tablet Take 20 mEq by mouth 2 (two) times daily. 06/08/19   [provider]  SUMAtriptan (IMITREX) 25 MG tablet Take 25 mg by mouth every 2 (two) hours as needed for migraine. May repeat in 2 hours if headache persists or recurs. PRN    [provider]  topiramate (TOPAMAX) 25 MG tablet Take 1 tablet (25 mg total) by mouth 2 (two) times daily. Patient  not taking: Reported on 10/09/2020 08/02/19   Garvin Fila, MD  TRULICITY 8.18 HU/3.1SH SOPN Inject 0.75 mg into the skin every Sunday. 05/19/19   [provider]  Zinc Methionate 50 MG CAPS Take by mouth.    [provider]      Allergies    Topiramate, Jardiance [empagliflozin], Nortriptyline, Other, and Tape    Review of Systems   Review of Systems  Physical Exam Updated Vital Signs BP 133/62   Pulse 70   Temp 98.2 F (36.8 C) (Oral)   Resp 18   Ht '5\' 3"'$  (1.6 m)   Wt 105.2 kg   SpO2 98%   BMI 41.10 kg/m  Physical Exam Vitals and nursing note reviewed.   Constitutional:      General: She is not in acute distress.    Appearance: She is well-developed.  HENT:     Head: Normocephalic and atraumatic.     Mouth/Throat:     Mouth: Mucous membranes are moist.  Eyes:     Conjunctiva/sclera: Conjunctivae normal.     Pupils: Pupils are equal, round, and reactive to light.  Cardiovascular:     Rate and Rhythm: Normal rate and regular rhythm.     Pulses: Normal pulses.     Heart sounds: Normal heart sounds. No murmur heard. Pulmonary:     Effort: Pulmonary effort is normal. No respiratory distress.     Breath sounds: Normal breath sounds.  Abdominal:     Palpations: Abdomen is soft.     Tenderness: There is no abdominal tenderness.  Musculoskeletal:        General: No swelling.     Cervical back: Neck supple.  Skin:    General: Skin is warm and dry.     Capillary Refill: Capillary refill takes less than 2 seconds.  Neurological:     General: No focal deficit present.     Mental Status: She is alert and oriented to person, place, and time.     Cranial Nerves: No cranial nerve deficit.     Motor: No weakness.     Coordination: Coordination normal.     Gait: Gait normal.     Comments: 5+ out of 5 strength throughout, normal finger-nose-finger, normal speech, normal visual fields, normal gait.  Subjective numbness to the right side of her face, arm, leg when compared to the left, there is no speech abnormality  Psychiatric:        Mood and Affect: Mood normal.     ED Results / Procedures / Treatments   Labs (all labs ordered are listed, but only abnormal results are displayed) Labs Reviewed  CBC WITH DIFFERENTIAL/PLATELET - Abnormal; Notable for the following components:      Result Value   Hemoglobin 11.7 (*)    HCT 35.4 (*)    All other components within normal limits  BASIC METABOLIC PANEL - Abnormal; Notable for the following components:   Potassium 3.2 (*)    All other components within normal limits    EKG EKG  Interpretation  Date/Time:  Wednesday July 17 2021 09:43:20 EDT Ventricular Rate:  70 PR Interval:  154 QRS Duration: 81 QT Interval:  400 QTC Calculation: 432 R Axis:   40 Text Interpretation: Sinus rhythm Confirmed by Lennice Sites ([id number]) on 07/17/2021 9:48:19 AM  Radiology CT Head Wo Contrast  Result Date: 07/17/2021 CLINICAL DATA:  Worsening headache since this morning. Facial numbness. EXAM: CT HEAD WITHOUT CONTRAST TECHNIQUE: Contiguous axial images were obtained from  the base of the skull through the vertex without intravenous contrast. RADIATION DOSE REDUCTION: This exam was performed according to the departmental dose-optimization program which includes automated exposure control, adjustment of the mA and/or kV according to patient size and/or use of iterative reconstruction technique. COMPARISON:  March 08, 2017 FINDINGS: Brain: No evidence of acute infarction, hemorrhage, hydrocephalus, extra-axial collection or mass lesion/mass effect. There is chronic diffuse atrophy. Chronic bilateral periventricular white matter small vessel ischemic changes are noted. Vascular: No hyperdense vessel is noted. Skull: Normal. Negative for fracture or focal lesion. Sinuses/Orbits: No acute finding. Other: None. IMPRESSION: 1. No CT evidence of acute intracranial abnormality. 2. Chronic diffuse atrophy. 3. Chronic bilateral periventricular white matter small vessel ischemic changes. Electronically Signed   By: Abelardo Diesel M.D.   On: 07/17/2021 10:31    Procedures Procedures    Medications Ordered in ED Medications  prochlorperazine (COMPAZINE) injection 10 mg (10 mg Intravenous Given 07/17/21 0959)  diphenhydrAMINE (BENADRYL) injection 12.5 mg (12.5 mg Intravenous Given 07/17/21 0957)  sodium chloride 0.9 % bolus 1,000 mL (1,000 mLs Intravenous New Bag/Given 07/17/21 0957)  acetaminophen (TYLENOL) tablet 650 mg (650 mg Oral Given 07/17/21 0947)    ED Course/ Medical Decision Making/ A&P                            Medical Decision Making Amount and/or Complexity of Data Reviewed Labs: ordered. Radiology: ordered.  Risk OTC drugs. Prescription drug management.   DARELY BECKNELL is here with headache and numbness.  Normal vitals.  No fever.  States that her blood pressure was elevated this morning, had migraine type headache yesterday that subsided as the day went through.  Has a mild headache now similar to migraines in the past.  But she is having some tingling feeling to both sides of her face yesterday but now having some tingling mostly on the right side of her body.  She is concerned because of high blood pressure this morning but vital signs are normal now.  Neurologic exam is overall normal except for some subjective numbness over the right side of her face, arm and leg.  She states that this is happened before in the past with headaches.  Per my review of her medical chart.  She did recently see neurologist about a month or so ago for the symptoms.  She had an MRI that showed no acute stroke.  She does have some chronic small vessel disease.  Overall differential diagnosis is likely complex migraine versus less likely stroke.  Could be electrolyte abnormality.  We will treat headache further with Compazine and Benadryl.  Will check CBC and BMP and get head CT.  At this time she does not feel like pursuing MRI but will reengage her about this after work-up.  My suspicion is that this is likely complex migraine.  This is very similar to previous event and she has had fairly recent head imaging.  There are no obvious neurologic deficits on exam set for subjective numbness.  This does not appear to be consistent with MS or other neuromuscular processes.  Per my review and interpretation of labs and imaging, there is no acute findings.  No acute leukocytosis or anemia or electrolyte abnormality.  Head CT with no acute findings.  Per chart review she has had an MRI of her several months for the last  several years that shows stable chronic small vessel disease.  She feels like  her symptoms have improved following headache cocktail.  Overall I think that this is a complex migraine and have very low concern for stroke.  Shared decision making was made to hold off on MRI.  We will have her follow-up with her neurologist.  Discharged in good condition.  Understands return precautions.  This chart was dictated using voice recognition software.  Despite best efforts to proofread,  errors can occur which can change the documentation meaning.         Final Clinical Impression(s) / ED Diagnoses Final diagnoses:  Numbness  Other migraine without status migrainosus, not intractable    Rx / DC Orders ED Discharge Orders     None         Lennice Sites, DO 07/17/21 1055

## 2022-04-02 ENCOUNTER — Other Ambulatory Visit: Payer: Self-pay

## 2022-04-02 ENCOUNTER — Encounter (HOSPITAL_BASED_OUTPATIENT_CLINIC_OR_DEPARTMENT_OTHER): Payer: Self-pay | Admitting: Emergency Medicine

## 2022-04-02 ENCOUNTER — Emergency Department (HOSPITAL_BASED_OUTPATIENT_CLINIC_OR_DEPARTMENT_OTHER): Payer: 59

## 2022-04-02 ENCOUNTER — Emergency Department (HOSPITAL_BASED_OUTPATIENT_CLINIC_OR_DEPARTMENT_OTHER)
Admission: EM | Admit: 2022-04-02 | Discharge: 2022-04-02 | Disposition: A | Discharge status: Home / Self Care | Payer: 59 | Attending: Emergency Medicine | Admitting: Emergency Medicine

## 2022-04-02 DIAGNOSIS — Z8673 Personal history of transient ischemic attack (TIA), and cerebral infarction without residual deficits: Secondary | ICD-10-CM | POA: Insufficient documentation

## 2022-04-02 DIAGNOSIS — G43809 Other migraine, not intractable, without status migrainosus: Secondary | ICD-10-CM | POA: Diagnosis not present

## 2022-04-02 DIAGNOSIS — Z7984 Long term (current) use of oral hypoglycemic drugs: Secondary | ICD-10-CM | POA: Diagnosis not present

## 2022-04-02 DIAGNOSIS — R519 Headache, unspecified: Secondary | ICD-10-CM | POA: Diagnosis present

## 2022-04-02 DIAGNOSIS — Z79899 Other long term (current) drug therapy: Secondary | ICD-10-CM | POA: Insufficient documentation

## 2022-04-02 DIAGNOSIS — M25552 Pain in left hip: Secondary | ICD-10-CM | POA: Diagnosis not present

## 2022-04-02 DIAGNOSIS — I10 Essential (primary) hypertension: Secondary | ICD-10-CM | POA: Insufficient documentation

## 2022-04-02 DIAGNOSIS — Z7982 Long term (current) use of aspirin: Secondary | ICD-10-CM | POA: Insufficient documentation

## 2022-04-02 LAB — BASIC METABOLIC PANEL
Anion gap: 7 (ref 5–15)
BUN: 11 mg/dL (ref 8–23)
CO2: 29 mmol/L (ref 22–32)
Calcium: 8.5 mg/dL — ABNORMAL LOW (ref 8.9–10.3)
Chloride: 105 mmol/L (ref 98–111)
Creatinine, Ser: 0.86 mg/dL (ref 0.44–1.00)
GFR, Estimated: >60 mL/min (ref >60)
Glucose, Bld: 123 mg/dL — ABNORMAL HIGH (ref 70–99)
Potassium: 3.5 mmol/L (ref 3.5–5.1)
Sodium: 141 mmol/L (ref 135–145)

## 2022-04-02 LAB — CBC WITH DIFFERENTIAL/PLATELET
Abs Immature Granulocytes: 0.02 10*3/uL (ref 0.00–0.07)
Basophils Absolute: 0 10*3/uL (ref 0.0–0.1)
Basophils Relative: 0 %
Eosinophils Absolute: 0.1 10*3/uL (ref 0.0–0.5)
Eosinophils Relative: 1 %
HCT: 36.2 % (ref 36.0–46.0)
Hemoglobin: 11.7 g/dL — ABNORMAL LOW (ref 12.0–15.0)
Immature Granulocytes: 0 %
Lymphocytes Relative: 35 %
Lymphs Abs: 2.9 10*3/uL (ref 0.7–4.0)
MCH: 27.5 pg (ref 26.0–34.0)
MCHC: 32.3 g/dL (ref 30.0–36.0)
MCV: 85.2 fL (ref 80.0–100.0)
Monocytes Absolute: 0.7 10*3/uL (ref 0.1–1.0)
Monocytes Relative: 8 %
Neutro Abs: 4.6 10*3/uL (ref 1.7–7.7)
Neutrophils Relative %: 56 %
Platelets: 364 10*3/uL (ref 150–400)
RBC: 4.25 MIL/uL (ref 3.87–5.11)
RDW: 14.7 % (ref 11.5–15.5)
WBC: 8.3 10*3/uL (ref 4.0–10.5)
nRBC: 0 % (ref 0.0–0.2)

## 2022-04-02 LAB — CBG MONITORING, ED: Glucose-Capillary: 111 mg/dL — ABNORMAL HIGH (ref 70–99)

## 2022-04-02 MED ORDER — DIPHENHYDRAMINE HCL 50 MG/ML IJ SOLN
12.5000 mg | Freq: Once | INTRAMUSCULAR | Status: AC
  Administered 2022-04-02: 12.5 mg via INTRAVENOUS
  Filled 2022-04-02: qty 1

## 2022-04-02 MED ORDER — AMLODIPINE BESYLATE 5 MG PO TABS: 5.0000 mg | ORAL_TABLET | Freq: Every day | ORAL | 0 refills | Status: AC

## 2022-04-02 MED ORDER — AMLODIPINE BESYLATE 5 MG PO TABS
5.0000 mg | ORAL_TABLET | Freq: Once | ORAL | Status: AC
  Administered 2022-04-02: 5 mg via ORAL
  Filled 2022-04-02: qty 1

## 2022-04-02 MED ORDER — PROCHLORPERAZINE EDISYLATE 10 MG/2ML IJ SOLN
10.0000 mg | Freq: Once | INTRAMUSCULAR | Status: AC
Start: 2022-04-02 — End: 2022-04-02
  Administered 2022-04-02: 10 mg via INTRAVENOUS
  Filled 2022-04-02: qty 2

## 2022-04-02 NOTE — Discharge Instructions (Signed)
Take next dose of amlodipine tomorrow.  You have been given your first dose today.  Follow-up with neurology and primary care doctor.  Please return if symptoms worsen as discussed.

## 2022-04-02 NOTE — ED Triage Notes (Signed)
Pt arrived via POV.  Ambulated to room.  Pt states high BP for 2 weeks.  Headache since Saturday.  Pt states she woke up at 5 am with numbness and tingling to right side of her face.  Also having a "heaviness" and pain to left hip upon awakening as well.  LKN was 8 pm last night.  Dr. Ronnald Nian at bedside to see patient.  Pt states she was recently diagnosed with multiple myeloma.  She states she had a bone marrow biopsy in February.

## 2022-04-02 NOTE — ED Notes (Signed)
Discharge instructions reviewed with patient. Patient verbalizes understanding, no further questions at this time. Medications/prescriptions and follow up information provided. No acute distress noted at time of departure.  

## 2022-04-02 NOTE — ED Provider Notes (Signed)
[facility] Provider Note   CSN: [id number] Arrival date & time: 04/02/22  [id number]     History  Chief Complaint  Patient presents with   Hypertension   Numbness   Headache    Terri Waters is a 67 y.o. female.  Patient here with headache, high blood pressure.  Headache for the last couple weeks with elevated blood pressure.  She is having some tingling to the right side of her face.  Has history of high cholesterol, hypertension, migraines, stroke.  Headache medicines have not helped at home.  She is recently had some titration of her blood pressure medications but still having high blood pressures at home.  She is now on Benicar and she states that she started spironolactone recently.  She denies any speech changes or vision loss or weakness.  She has some pain in her left hip that sometimes makes it feel like her leg is weak.  She denies any dizziness or gait imbalance or vision changes or chest pain or shortness of breath.  She also states history of anxiety.  The history is provided by the patient.       Home Medications Prior to Admission medications   Medication Sig Start Date End Date Taking? Authorizing Provider  albuterol (VENTOLIN HFA) 108 (90 Base) MCG/ACT inhaler Inhale 1 puff into the lungs every 6 (six) hours as needed. 04/15/18  Yes [provider]  amLODipine (NORVASC) 5 MG tablet Take 1 tablet (5 mg total) by mouth daily. 04/02/22 05/02/22 Yes Labrina Lines, DO  aspirin EC 81 MG tablet Take by mouth. 09/21/19  Yes [provider]  Dulaglutide (TRULICITY) 1.5 0000000 SOPN Inject 1.5 mg into the skin once a week. 12/11/21  Yes [provider]  LINZESS 145 MCG CAPS capsule Take 145 mcg by mouth daily before breakfast. 12/17/21  Yes [provider]  Cholecalciferol 1.25 MG (50000 UT) capsule Take 50,000 Units by mouth once a week.    [provider]  conjugated estrogens (PREMARIN)  vaginal cream INSERT 1/4 TH GRAM  VAGINALLY TWICE WEEKLY AS  DIRECTED 09/21/18   [provider]  levothyroxine (SYNTHROID) 88 MCG tablet Take 44-88 mcg by mouth See admin instructions. Take 1 tablet by mouth daily except on Sundays take 44 mcg per patient    [provider]  metFORMIN (GLUCOPHAGE-XR) 500 MG 24 hr tablet Take 2 tablets (1,000 mg total) by mouth 2 (two) times daily. Start after 2 days as he received IV contrast. 09/28/20   Elgergawy, Silver Huguenin, MD  olmesartan (BENICAR) 20 MG tablet Take 2 tablets by mouth daily. 07/04/19   [provider]  pantoprazole (PROTONIX) 40 MG tablet Take 40 mg by mouth 2 (two) times daily before a meal.    [provider]  spironolactone (ALDACTONE) 50 MG tablet Take 50 mg by mouth daily.    [provider]      Allergies    Empagliflozin, Nortriptyline, Topiramate, Other, and Tape    Review of Systems   Review of Systems  Physical Exam Updated Vital Signs BP (!) 176/71   Pulse 67   Temp 98.2 F (36.8 C) (Oral)   Resp 16   Ht 5\' 3"  (1.6 m)   Wt 103 kg   SpO2 98%   BMI 40.22 kg/m  Physical Exam Vitals and nursing note reviewed.  Constitutional:      General: She is not in acute distress.    Appearance: She  is well-developed. She is not ill-appearing.  HENT:     Head: Normocephalic and atraumatic.     Nose: Nose normal.     Mouth/Throat:     Mouth: Mucous membranes are moist.  Eyes:     Extraocular Movements: Extraocular movements intact.     Conjunctiva/sclera: Conjunctivae normal.     Pupils: Pupils are equal, round, and reactive to light.  Cardiovascular:     Rate and Rhythm: Normal rate and regular rhythm.     Pulses: Normal pulses.     Heart sounds: Normal heart sounds. No murmur heard. Pulmonary:     Effort: Pulmonary effort is normal. No respiratory distress.     Breath sounds: Normal breath sounds.  Abdominal:     Palpations: Abdomen is soft.     Tenderness: There is no abdominal  tenderness.  Musculoskeletal:        General: No swelling.     Cervical back: Normal range of motion and neck supple.  Skin:    General: Skin is warm and dry.     Capillary Refill: Capillary refill takes less than 2 seconds.  Neurological:     General: No focal deficit present.     Mental Status: She is alert and oriented to person, place, and time.     Cranial Nerves: No cranial nerve deficit.     Sensory: No sensory deficit.     Motor: No weakness.     Coordination: Coordination normal.     Comments: 5+ out of 5 strength throughout, some decreased sensation to the right forehead and the left ankle but otherwise sensations intact throughout no drift, normal gait, normal finger-nose-finger, normal speech  Psychiatric:        Mood and Affect: Mood normal.     ED Results / Procedures / Treatments   Labs (all labs ordered are listed, but only abnormal results are displayed) Labs Reviewed  CBC WITH DIFFERENTIAL/PLATELET - Abnormal; Notable for the following components:      Result Value   Hemoglobin 11.7 (*)    All other components within normal limits  BASIC METABOLIC PANEL - Abnormal; Notable for the following components:   Glucose, Bld 123 (*)    Calcium 8.5 (*)    All other components within normal limits  CBG MONITORING, ED - Abnormal; Notable for the following components:   Glucose-Capillary 111 (*)    All other components within normal limits    EKG EKG Interpretation  Date/Time:  Wednesday April 02 2022 07:47:53 EDT Ventricular Rate:  82 PR Interval:  136 QRS Duration: 102 QT Interval:  379 QTC Calculation: 443 R Axis:   65 Text Interpretation: Sinus rhythm Atrial premature complex Confirmed by Lennice Sites ([id number]) on 04/02/2022 7:49:22 AM  Radiology CT Head Wo Contrast  Result Date: 04/02/2022 CLINICAL DATA:  Headache, increasing frequency or severity EXAM: CT HEAD WITHOUT CONTRAST TECHNIQUE: Contiguous axial images were obtained from the base of the skull  through the vertex without intravenous contrast. RADIATION DOSE REDUCTION: This exam was performed according to the departmental dose-optimization program which includes automated exposure control, adjustment of the mA and/or kV according to patient size and/or use of iterative reconstruction technique. COMPARISON:  07/17/2021 FINDINGS: Brain: No evidence of acute infarction, hemorrhage, hydrocephalus, extra-axial collection or mass lesion/mass effect. Scattered low-density changes within the periventricular and subcortical white matter most compatible with chronic microvascular ischemic change. Mild diffuse cerebral volume loss. Vascular: Atherosclerotic calcifications involving the large vessels of the skull base. No unexpected hyperdense  vessel. Skull: Normal. Negative for fracture or focal lesion. Sinuses/Orbits: No acute finding. Other: None. IMPRESSION: 1. No acute intracranial findings. 2. Chronic microvascular ischemic change and cerebral volume loss. Electronically Signed   By: Davina Poke D.O.   On: 04/02/2022 08:11    Procedures Procedures    Medications Ordered in ED Medications  prochlorperazine (COMPAZINE) injection 10 mg (10 mg Intravenous Given 04/02/22 0804)  diphenhydrAMINE (BENADRYL) injection 12.5 mg (12.5 mg Intravenous Given 04/02/22 0801)  amLODipine (NORVASC) tablet 5 mg (5 mg Oral Given 04/02/22 0850)    ED Course/ Medical Decision Making/ A&P                             Medical Decision Making Amount and/or Complexity of Data Reviewed Labs: ordered. Radiology: ordered.  Risk Prescription drug management.   ARETHIA ATTEBURY is here with migraine headache, high blood pressure, tingling.  Blood pressure 191/98.  Otherwise normal vitals.  Her neurological exam is normal.  She has some patchy changes in her sensation in the right forehead and left ankle but otherwise sensations intact throughout.  She has 5+ out of 5 strength throughout, normal visual fields, normal  speech.  Overall seems like an atypical presentation for stroke and I suspect complex migraine is differential versus less likely head bleed.  She is not encephalopathic and seems less likely to be press.  Have no concern for infectious process.  She does endorse some anxiety which could be playing a role as well.  Overall her exam is reassuring but will get CBC, BMP, head CT and EKG.  Will give headache cocktail with Compazine and Benadryl and reevaluate.  Headaches been on and off for about a week or 2.  Blood pressure has been consistently elevated in the last 2 weeks despite medication changes her primary care doctor.  Tingling sensation on and off as well.  Overall workup per my review and interpretation unremarkable.  No significant anemia or electrolyte abnormality or kidney injury.  Head CT is normal.  Headaches greatly improved.  Symptoms improved.  Overall suspect complex migraine which she has a history of the same.  I have very low suspicion for stroke.  Sheer decision was made to hold off on any further stroke workup as she is really just having some patchy paresthesias that do not really fit stroke.  Blood pressure is improved.  Will start her on amlodipine.  She understands return precautions.  Recommend follow-up with neurology.  Discharged in good condition.  This chart was dictated using voice recognition software.  Despite best efforts to proofread,  errors can occur which can change the documentation meaning.         Final Clinical Impression(s) / ED Diagnoses Final diagnoses:  Other migraine without status migrainosus, not intractable    Rx / DC Orders ED Discharge Orders          Ordered    amLODipine (NORVASC) 5 MG tablet  Daily        04/02/22 0840              Lennice Sites, DO 04/02/22 1513

## 2022-12-05 ENCOUNTER — Other Ambulatory Visit (HOSPITAL_COMMUNITY): Payer: Self-pay | Admitting: Physician Assistant

## 2022-12-05 DIAGNOSIS — Z96651 Presence of right artificial knee joint: Secondary | ICD-10-CM

## 2022-12-17 ENCOUNTER — Encounter (HOSPITAL_COMMUNITY)
Admission: RE | Admit: 2022-12-17 | Discharge: 2022-12-17 | Disposition: A | Discharge status: Home / Self Care | Payer: 59 | Source: Ambulatory Visit | Attending: Physician Assistant | Admitting: Physician Assistant

## 2022-12-17 DIAGNOSIS — Z96651 Presence of right artificial knee joint: Secondary | ICD-10-CM | POA: Insufficient documentation

## 2022-12-17 MED ORDER — TECHNETIUM TC 99M MEDRONATE IV KIT
20.0000 | PACK | Freq: Once | INTRAVENOUS | Status: AC | PRN
  Administered 2022-12-17: 21 via INTRAVENOUS

## 2023-01-15 ENCOUNTER — Ambulatory Visit: Payer: 59 | Attending: Orthopedic Surgery

## 2023-01-15 ENCOUNTER — Other Ambulatory Visit: Payer: Self-pay

## 2023-01-15 DIAGNOSIS — R29898 Other symptoms and signs involving the musculoskeletal system: Secondary | ICD-10-CM | POA: Insufficient documentation

## 2023-01-15 DIAGNOSIS — M7660 Achilles tendinitis, unspecified leg: Secondary | ICD-10-CM | POA: Insufficient documentation

## 2023-01-15 DIAGNOSIS — R262 Difficulty in walking, not elsewhere classified: Secondary | ICD-10-CM | POA: Diagnosis present

## 2023-01-15 DIAGNOSIS — M25672 Stiffness of left ankle, not elsewhere classified: Secondary | ICD-10-CM | POA: Diagnosis present

## 2023-01-15 NOTE — Therapy (Signed)
 OUTPATIENT PHYSICAL THERAPY LOWER EXTREMITY EVALUATION   Patient Name: Terri Waters MRN: [id number] DOB:[birth year], 68 y.o., female Today's Date: 01/15/2023  END OF SESSION:  PT End of Session - 01/15/23 1625     Visit Number 1    Date for PT Re-Evaluation 02/26/23    Progress Note Due on Visit 10    PT Start Time 1400    PT Stop Time 1445    PT Time Calculation (min) 45 min    Activity Tolerance Patient tolerated treatment well    Behavior During Therapy WFL for tasks assessed/performed             Past Medical History:  Diagnosis Date   Cancer (HCC)    papillary carcinoma   Diabetes mellitus without complication (HCC)    High cholesterol    Hypertension    Hypothyroidism    Migraines    Stroke (HCC)    Thyroid  disease    Ventricular tachycardia (HCC)    Past Surgical History:  Procedure Laterality Date   ABDOMINAL HYSTERECTOMY     BREAST SURGERY     lumps removed - over 20 years ago   THYROIDECTOMY     TOTAL KNEE ARTHROPLASTY Right 12/18/2017   Procedure: RIGHT TOTAL KNEE ARTHROPLASTY;  Surgeon: Kay Kemps, MD;  Location: [facility] OR;  Service: Orthopedics;  Laterality: Right;   Patient Active Problem List   Diagnosis Date Noted   Dizziness 09/24/2020   AMS (altered mental status) 09/24/2020   Cerebellar infarct (HCC) 06/23/2019   Status post total knee replacement, 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  L heel really got  swollen, they had me take meloxicam and I've been in the CAM boot for 3 weeks, had to stop the meloxicam due to stomach irritation.    PERTINENT HISTORY: Sudden onset of R knee pain as well as L heel/ achilles pain, L heel with edema, was cleared for blood clot, gout, by PCP, saw orthopedist, who determined insertional achilles tendinopathy and referred to PT as well as the CAM boot.  PAIN:  Are you having pain? Yes: NPRS scale: 0 to6 Pain location: L heel post.at calcaneus Pain description: hurts with weight bearing, with pulling toes up Aggravating factors: weight bearing on L  Relieving factors: icing, and heat, the boot has helped a lot   PRECAUTIONS: Other: dx'd with multiple myeloma  RED FLAGS: Multiple myeloma to have additional consultation in March regarding treatment    WEIGHT BEARING RESTRICTIONS: No  FALLS:  Has patient fallen in last 6 months? No  LIVING ENVIRONMENT: Lives with: lives with their family and lives with their spouse Lives in: House/apartment Stairs: No Has following equipment at home: None  OCCUPATION: works in coding, works at home for [employer]  PLOF: Independent  PATIENT GOALS: get rid of this pain   NEXT MD VISIT: Jan 31  OBJECTIVE:  Note: Objective measures were completed at Evaluation unless otherwise noted.  DIAGNOSTIC FINDINGS: spur L calcaneus  PATIENT SURVEYS:  FAAM TBD  COGNITION: Overall cognitive status: Within functional limits for tasks assessed     SENSATION: WFL  EDEMA:  Figure 8: L 54cm, R 52.5cm  MUSCLE LENGTH: Hamstrings: Right wfl deg; Left wfl deg ]  POSTURE: in CAM boot L, antalgic L With sit to stand, L foot with too many toes sign, and navicular drop 1 cm greater than R ,observable and palpable edema L calcaneus, med and lat ankle  PALPATION: Exquisitely tender L calcaneus, at insertion of plantar fascia, also post calcaneus.  Tender and thick multiple pts along soleus musculature, ridge noted  middle achilles tendon.  LOWER EXTREMITY ROM:  Active ROM Right eval Left eval  Hip flexion    Hip extension    Hip abduction    Hip adduction    Hip internal rotation    Hip external rotation    Knee flexion    Knee extension    Ankle dorsiflexion 11 -8  Ankle plantarflexion wnl wnl  Ankle inversion    Ankle eversion     (Blank rows = not tested)  LOWER EXTREMITY MMT: deferred L LE due to high irritability of L ankle tissue.    LOWER EXTREMITY SPECIAL TESTS:  Ankle special tests: Thompson's test: negative and Great toe extension test: negative  FUNCTIONAL TESTS:  Not performed  GAIT: Distance walked: 100' in clinic Assistive device utilized: cam boot L Level of assistance: Modified independence Comments:                                                                                                                                 TREATMENT DATE: 01/15/23: evaluation,  Utilized kinesiotaping L ankle to support achilles tendon, 2 I pieces extending from plantar surface forefoot , to med and lat gastroc heads. Instructed in isometric L ankle plantarflexion with strap, 10 sec holds, 5 to 6 reps, inst to perform with up to 1-2 10 pain, medium/minimal force     PATIENT EDUCATION:  Education details: POC goals Person educated: Patient Education method: Explanation, Demonstration, Tactile cues, Verbal cues, and Handouts Education comprehension: verbalized understanding, returned demonstration, and verbal cues required  HOME EXERCISE PROGRAM: Provided picture of L ankle pflex isometric ex  ASSESSMENT:  CLINICAL IMPRESSION: Patient is a 68 y.o. female who was evaluated today by physical therapy due to L achilles insertional tendinopathy.  Her L achilles tendon is quite irritable, with visible edema and exquisitely tender, with loss of flexibility for dorsiflexion.  Therefore initiated techniques to provide more stability for the irritated tissue and initiated isometrics,  with hopes to progress to eccentrics next visit. She responded well initially.  OBJECTIVE IMPAIRMENTS: Abnormal gait, decreased activity tolerance, decreased balance, difficulty walking, decreased ROM, decreased strength, impaired perceived functional ability, impaired flexibility, and pain.   ACTIVITY LIMITATIONS: carrying and lifting  PARTICIPATION LIMITATIONS: meal prep, laundry, driving, occupation, yard work, and church  PERSONAL FACTORS: Age, Behavior pattern, Fitness, Past/current experiences, Time since onset of injury/illness/exacerbation, and  1-2 comorbidities: multiple myeloma, diabetes  are also affecting patient's functional outcome.   REHAB POTENTIAL: Good  CLINICAL DECISION MAKING: Evolving/moderate complexity  EVALUATION COMPLEXITY: Moderate   GOALS: Goals reviewed with patient? Yes  SHORT TERM GOALS: Target date: 2 weeks 01/29/23 I HEP Baseline: Goal status: INITIAL  LONG TERM GOALS: Target date: 02/26/23  FAAM rate 65 or higher Baseline: TBD Goal status: INITIAL  2.  Gait speed over 300' at 1.0 m/sec Baseline: TBD Goal status: INITIAL  3.  Improved L ankle dorsiflexion from -11 to 10 for improved gait pattern Baseline:  Goal status: INITIAL  4.  Strength L ankle improve to able to perform 15 B heel raises without pain Baseline:unable  Goal status: INITIAL    PLAN:  PT FREQUENCY: 1-2x/week  PT DURATION: 6 weeks  PLANNED INTERVENTIONS: 97110-Therapeutic exercises, 97530- Therapeutic activity, 97112- Neuromuscular re-education, 97535- Self Care, [id number]- Manual therapy, Taping, Dry Needling, and Joint mobilization  PLAN FOR NEXT SESSION: recommend use of the vasopneumatic compression to reduce edema and pain L heel/ achilles.  Also how was kinesiotaping?  Recommend deep soft tissue work L plantarflexors, also initiate eccentric training L ankle as tolerated.   Gwendalynn Eckstrom L Cyrilla Durkin, PT, DPT, OCS 01/15/2023, 4:50 PM

## 2023-01-22 ENCOUNTER — Ambulatory Visit: Payer: 59

## 2023-01-22 ENCOUNTER — Other Ambulatory Visit: Payer: Self-pay

## 2023-01-22 DIAGNOSIS — M7660 Achilles tendinitis, unspecified leg: Secondary | ICD-10-CM

## 2023-01-22 DIAGNOSIS — R262 Difficulty in walking, not elsewhere classified: Secondary | ICD-10-CM | POA: Diagnosis not present

## 2023-01-22 DIAGNOSIS — M25672 Stiffness of left ankle, not elsewhere classified: Secondary | ICD-10-CM

## 2023-01-22 DIAGNOSIS — R29898 Other symptoms and signs involving the musculoskeletal system: Secondary | ICD-10-CM

## 2023-01-22 NOTE — Therapy (Signed)
 OUTPATIENT PHYSICAL THERAPY LOWER EXTREMITY TREATMENT   Patient Name: Terri Waters MRN: [id number] DOB:07-Feb-1955, 68 y.o., female Today's Date: 01/22/2023  END OF SESSION:  PT End of Session - 01/22/23 1543     Visit Number 2    Date for PT Re-Evaluation 02/26/23    Progress Note Due on Visit 10    PT Start Time 1452    PT Stop Time 1538    PT Time Calculation (min) 46 min    Activity Tolerance Patient tolerated treatment well    Behavior During Therapy WFL for tasks assessed/performed              Past Medical History:  Diagnosis Date   Cancer (HCC)    papillary carcinoma   Diabetes mellitus without complication (HCC)    High cholesterol    Hypertension    Hypothyroidism    Migraines    Stroke (HCC)    Thyroid  disease    Ventricular tachycardia (HCC)    Past Surgical History:  Procedure Laterality Date   ABDOMINAL HYSTERECTOMY     BREAST SURGERY     lumps removed - over 20 years ago   THYROIDECTOMY     TOTAL KNEE ARTHROPLASTY Right 12/18/2017   Procedure: RIGHT TOTAL KNEE ARTHROPLASTY;  Surgeon: Kay Kemps, MD;  Location: [facility] OR;  Service: Orthopedics;  Laterality: Right;   Patient Active Problem List   Diagnosis Date Noted   Dizziness 09/24/2020   AMS (altered mental status) 09/24/2020   Cerebellar infarct (HCC) 06/23/2019   Status post total knee replacement, 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   Migraine with aura and without status migrainosus, not intractable 04/09/2017    PCP: [name], MD  REFERRING PROVIDER: Kit Rush, MD, orthopedics  REFERRING DIAG: L insertional  achilles tendonitis  THERAPY DIAG:  Difficulty in walking, not elsewhere classified  Insertional Achilles tendinopathy  Stiffness of left ankle, not elsewhere classified  Ankle weakness  Rationale for Evaluation and Treatment: Rehabilitation  ONSET DATE: October 2024  SUBJECTIVE:   SUBJECTIVE STATEMENT: I had the tape on for a day but had to remove it because it started  pulling a lot with the swelling in my L heel.  I tried the isometric ankle plantarflexion but it didn't help much.  Still very painful L heel, also some in ball of my foot too. PERTINENT HISTORY: Sudden onset of R knee pain as well as L heel/ achilles pain, L heel with edema, was cleared for blood clot, gout, by PCP, saw orthopedist, who determined insertional achilles tendinopathy and referred to PT as well as the CAM boot.  PAIN:  Are you having pain? Yes: NPRS scale: 0 to6 Pain location: L heel post.at calcaneus Pain description: hurts with weight bearing, with pulling toes up Aggravating factors: weight bearing on L  Relieving factors: icing, and heat, the boot has helped a lot   PRECAUTIONS: Other: dx'd with multiple myeloma  RED FLAGS: Multiple myeloma to have additional consultation in March regarding treatment    WEIGHT BEARING RESTRICTIONS: No  FALLS:  Has patient fallen in last 6 months? No  LIVING ENVIRONMENT: Lives with: lives with their family and lives with their spouse Lives in: House/apartment Stairs: No Has following equipment at home: None  OCCUPATION: works in coding, works at home for [employer]  PLOF: Independent  PATIENT GOALS: get rid of this pain   NEXT MD VISIT: Jan 31  OBJECTIVE:  Note: Objective measures were completed at Evaluation unless otherwise noted.  DIAGNOSTIC FINDINGS:  spur L calcaneus  PATIENT SURVEYS:  FAAM TBD  COGNITION: Overall cognitive status: Within functional limits for tasks assessed     SENSATION: WFL  EDEMA:  Figure 8: L 54cm, R 52.5cm  MUSCLE LENGTH: Hamstrings: Right wfl deg; Left wfl deg ]  POSTURE: in CAM boot L, antalgic L With sit to stand, L foot with too many toes sign, and navicular drop 1 cm greater than R ,observable and palpable edema L calcaneus, med and lat ankle  PALPATION: Exquisitely tender L calcaneus, at insertion of plantar fascia, also post calcaneus.  Tender and thick multiple pts  along soleus musculature, ridge noted middle achilles tendon.  LOWER EXTREMITY ROM:  Active ROM Right eval Left eval  Hip flexion    Hip extension    Hip abduction    Hip adduction    Hip internal rotation    Hip external rotation    Knee flexion    Knee extension    Ankle dorsiflexion 11 -8  Ankle plantarflexion wnl wnl  Ankle inversion    Ankle eversion     (Blank rows = not tested)  LOWER EXTREMITY MMT: deferred L LE due to high irritability of L ankle tissue.    LOWER EXTREMITY SPECIAL TESTS:  Ankle special tests: Thompson's test: negative and Great toe extension test: negative  FUNCTIONAL TESTS:  Not performed  GAIT: Distance walked: 100' in clinic Assistive device utilized: cam boot L Level of assistance: Modified independence Comments:                                                                                                                                 TREATMENT DATE: 01/22/23:  Vasopneumatic , L ankle, 34 degrees, for 10 min with L LE elevated on wedge to help with edema, pain Sidelying L with R leg on foam roller, for cross friction massage L soleus, also myofascial release with massage tool over soleus, with relief reported by pt  Seated for instruction in red theraband ankle pumps, with emphasis on eccentric control to isolate achilles, instructed to perform 15 reps, 2 x day, monitor for pain, avoid pain greater than 4 with the ex.    Added a heel lift, 1/8 inside boot, to reduce pull on achilles tendon. Patient reported immediate relief of heel pain with the lift. Advised her that she should be able to gradually remove the lifts over the next 4 to 6 weeks as her tissue irritability declines.     01/15/23: evaluation,  Utilized kinesiotaping L ankle to support achilles tendon, 2 I pieces extending from plantar surface forefoot , to med and lat gastroc heads. Instructed in isometric L ankle plantarflexion with strap, 10 sec holds, 5 to 6 reps, inst to  perform with up to 1-2 10 pain, medium/minimal force     PATIENT EDUCATION:  Education details: POC goals Person educated: Patient Education method: Explanation, Demonstration, Tactile cues, Verbal cues, and Handouts Education comprehension:  verbalized understanding, returned demonstration, and verbal cues required  HOME EXERCISE PROGRAM: Provided picture of L ankle pflex isometric ex  ASSESSMENT:  CLINICAL IMPRESSION: Patient is a 68 y.o. female who participated in skilled physical therapy  today due to L achilles insertional tendinopathy.  Her L distal achilles tendon is quite irritable, with visible edema and exquisitely tender around her post calcaneus.   Today we focused on reducing her inflammation and introduced eccentrics. She responded well.  The vasopneumatic was only mildly effective so most likely we wont use again today.  initially.  OBJECTIVE IMPAIRMENTS: Abnormal gait, decreased activity tolerance, decreased balance, difficulty walking, decreased ROM, decreased strength, impaired perceived functional ability, impaired flexibility, and pain.   ACTIVITY LIMITATIONS: carrying and lifting  PARTICIPATION LIMITATIONS: meal prep, laundry, driving, occupation, yard work, and church  PERSONAL FACTORS: Age, Behavior pattern, Fitness, Past/current experiences, Time since onset of injury/illness/exacerbation, and 1-2 comorbidities: multiple myeloma, diabetes  are also affecting patient's functional outcome.   REHAB POTENTIAL: Good  CLINICAL DECISION MAKING: Evolving/moderate complexity  EVALUATION COMPLEXITY: Moderate   GOALS: Goals reviewed with patient? Yes  SHORT TERM GOALS: Target date: 2 weeks 01/29/23 I HEP Baseline: Goal status: INITIAL  LONG TERM GOALS: Target date: 02/26/23  FAAM rate 65 or higher Baseline: TBD Goal status: INITIAL  2.  Gait speed over 300' at 1.0 m/sec Baseline: TBD Goal status: INITIAL  3.  Improved L ankle dorsiflexion from -11 to 10 for  improved gait pattern Baseline:  Goal status: INITIAL  4.  Strength L ankle improve to able to perform 15 B heel raises without pain Baseline:unable  Goal status: INITIAL    PLAN:  PT FREQUENCY: 1-2x/week  PT DURATION: 6 weeks  PLANNED INTERVENTIONS: 97110-Therapeutic exercises, 97530- Therapeutic activity, 97112- Neuromuscular re-education, 97535- Self Care, [id number]- Manual therapy, Taping, Dry Needling, and Joint mobilization  PLAN FOR NEXT SESSION:   Recommend deep soft tissue work L plantarflexors, also progress eccentric training L ankle as tolerated, how has heel lift worked   Greig LITTIE Credit, PT, DPT, OCS 01/22/2023, 4:07 PM

## 2023-02-05 ENCOUNTER — Ambulatory Visit: Payer: 59

## 2023-02-05 DIAGNOSIS — M25672 Stiffness of left ankle, not elsewhere classified: Secondary | ICD-10-CM

## 2023-02-05 DIAGNOSIS — R262 Difficulty in walking, not elsewhere classified: Secondary | ICD-10-CM

## 2023-02-05 DIAGNOSIS — M7660 Achilles tendinitis, unspecified leg: Secondary | ICD-10-CM

## 2023-02-05 DIAGNOSIS — R29898 Other symptoms and signs involving the musculoskeletal system: Secondary | ICD-10-CM

## 2023-02-05 NOTE — Therapy (Signed)
OUTPATIENT PHYSICAL THERAPY LOWER EXTREMITY TREATMENT   Patient Name: Terri Waters MRN: [id number] DOB:January 09, 1956, 68 y.o., female Today's Date: 02/05/2023  END OF SESSION:  PT End of Session - 02/05/23 1405     Visit Number 3    Date for PT Re-Evaluation 02/26/23    Progress Note Due on Visit 10    Activity Tolerance Patient tolerated treatment well    Behavior During Therapy [facility] for tasks assessed/performed               Past Medical History:  Diagnosis Date   Cancer (HCC)    papillary carcinoma   Diabetes mellitus without complication (HCC)    High cholesterol    Hypertension    Hypothyroidism    Migraines    Stroke [facility])    Thyroid disease    Ventricular tachycardia (HCC)    Past Surgical History:  Procedure Laterality Date   ABDOMINAL HYSTERECTOMY     BREAST SURGERY     lumps removed - over 20 years ago   THYROIDECTOMY     TOTAL KNEE ARTHROPLASTY Right 12/18/2017   Procedure: RIGHT TOTAL KNEE ARTHROPLASTY;  Surgeon: Beverely Low, MD;  Location: [facility] OR;  Service: Orthopedics;  Laterality: Right;   Patient Active Problem List   Diagnosis Date Noted   Dizziness 09/24/2020   AMS (altered mental status) 09/24/2020   Cerebellar infarct (HCC) 06/23/2019   Status post total knee replacement, 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  Rationale for Evaluation and Treatment: Rehabilitation  ONSET DATE: October 2024  SUBJECTIVE:   SUBJECTIVE STATEMENT: I had to work over time last week have been hanging my leg down for long periods of time, did the ankle pump exercise a couple of times.my L heel and the back of my ankle really hurt.  The wedge helped some but when I take the boot off it really hurts, and still swells a lot. PERTINENT  HISTORY: Sudden onset of R knee pain as well as L heel/ achilles pain, L heel with edema, was cleared for blood clot, gout, by PCP, saw orthopedist, who determined insertional achilles tendinopathy and referred to PT as well as the CAM boot.  PAIN:  Are you having pain? Yes: NPRS scale: 0 to6 Pain location: L heel post.at calcaneus Pain description: hurts with weight bearing, with pulling toes up Aggravating factors: weight bearing on L  Relieving factors: icing, and heat, the boot has helped a lot   PRECAUTIONS: Other: dx'd with multiple myeloma  RED FLAGS: Multiple myeloma to have additional consultation in March regarding treatment    WEIGHT BEARING RESTRICTIONS: No  FALLS:  Has patient fallen in last 6 months? No  LIVING ENVIRONMENT: Lives with: lives with their family and lives with their spouse Lives in: House/apartment Stairs: No Has following equipment at home: None  OCCUPATION: works in coding, works at home for [employer]  PLOF: Independent  PATIENT GOALS: get rid of this pain   NEXT MD VISIT: Jan 31  OBJECTIVE:  Note: Objective measures were completed at Evaluation unless otherwise noted.  DIAGNOSTIC FINDINGS: spur L calcaneus  PATIENT SURVEYS:  FAAM TBD  COGNITION: Overall cognitive status: Within functional limits for tasks assessed     SENSATION: WFL  EDEMA:  Figure 8: L 54cm, R  52.5cm  MUSCLE LENGTH: Hamstrings: Right wfl deg; Left wfl deg ]  POSTURE: in CAM boot L, antalgic L With sit to stand, L foot with too many toes sign, and navicular drop 1 cm greater than R ,observable and palpable edema L calcaneus, med and lat ankle  PALPATION: Exquisitely tender L calcaneus, at insertion of plantar fascia, also post calcaneus.  Tender and thick multiple pts along soleus musculature, ridge noted middle achilles tendon.  LOWER EXTREMITY ROM:  Active ROM Right eval Left eval L  02/05/23  Hip flexion     Hip extension     Hip abduction      Hip adduction     Hip internal rotation     Hip external rotation     Knee flexion     Knee extension     Ankle dorsiflexion 11 -8   Ankle plantarflexion wnl wnl   Ankle inversion     Ankle eversion      (Blank rows = not tested)  LOWER EXTREMITY MMT: deferred L LE due to high irritability of L ankle tissue.    LOWER EXTREMITY SPECIAL TESTS:  Ankle special tests: Thompson's test: negative and Great toe extension test: negative  FUNCTIONAL TESTS:  Not performed  GAIT: Distance walked: 100' in clinic Assistive device utilized: cam boot L Level of assistance: Modified independence Comments:                                                                                                                                 TREATMENT DATE: 02/05/23:  Sidelying L with R leg on foam roller, for cross friction massage L soleus, also myofascial release with massage tool over soleus,  red theraband ankle pumps, with emphasis on eccentric control to isolate achilles, instructed to perform 15 reps, 2 x day, monitor for pain, avoid pain greater than 2 levels above her resting pain with the ex.    We discussed dry needling, ultrasound but pt deferred.    01/22/23:  Vasopneumatic , L ankle, 34 degrees, for 10 min with L LE elevated on wedge to help with edema, pain Sidelying L with R leg on foam roller, for cross friction massage L soleus, also myofascial release with massage tool over soleus, with relief reported by pt  Seated for instruction in red theraband ankle pumps, with emphasis on eccentric control to isolate achilles, instructed to perform 15 reps, 2 x day, monitor for pain, avoid pain greater than 4 with the ex.    Added a heel lift, 1/8" inside boot, to reduce pull on achilles tendon. Patient reported immediate relief of heel pain with the lift. Advised her that she should be able to gradually remove the lifts over the next 4 to 6 weeks as her tissue irritability declines.      01/15/23: evaluation,  Utilized kinesiotaping L ankle to support achilles tendon, 2 I pieces extending from plantar surface forefoot , to med  and lat gastroc heads. Instructed in isometric L ankle plantarflexion with strap, 10 sec holds, 5 to 6 reps, inst to perform with up to 1-2 10 pain, medium/minimal force     PATIENT EDUCATION:  Education details: POC goals Person educated: Patient Education method: Explanation, Demonstration, Tactile cues, Verbal cues, and Handouts Education comprehension: verbalized understanding, returned demonstration, and verbal cues required  HOME EXERCISE PROGRAM: Provided picture of L ankle pflex isometric ex  ASSESSMENT:  CLINICAL IMPRESSION: Patient is a 68 y.o. female who participated in skilled physical therapy  today due to L achilles insertional tendinopathy.  Overall her subjective Sx indicated worsening Sx with pain covering a larger area and more irritable tissue, with ongoing edema and resting pain at a 3.  The wedge utilized last session helped somewhat.   She has had increased work load over the last week with prolonged work days and prolonged dependent positioning.  Advised trial of compression socks( her husband has some) to reduce her edema and possibly improve pain.  She has minimally utilized the eccentric training with red t band, so reviewed today. We discontinued her other appts as we have not been able to achieve a positive outcome thus far with her L foot/ankle and the area is still very inflamed.  OBJECTIVE IMPAIRMENTS: Abnormal gait, decreased activity tolerance, decreased balance, difficulty walking, decreased ROM, decreased strength, impaired perceived functional ability, impaired flexibility, and pain.   ACTIVITY LIMITATIONS: carrying and lifting  PARTICIPATION LIMITATIONS: meal prep, laundry, driving, occupation, yard work, and church  PERSONAL FACTORS: Age, Behavior pattern, Fitness, Past/current experiences, Time since onset of  injury/illness/exacerbation, and 1-2 comorbidities: multiple myeloma, diabetes  are also affecting patient's functional outcome.   REHAB POTENTIAL: Good  CLINICAL DECISION MAKING: Evolving/moderate complexity  EVALUATION COMPLEXITY: Moderate   GOALS: Goals reviewed with patient? Yes  SHORT TERM GOALS: Target date: 2 weeks 01/29/23 I HEP Baseline: Goal status: INITIAL  LONG TERM GOALS: Target date: 02/26/23  FAAM rate 65 or higher Baseline: TBD Goal status: INITIAL  2.  Gait speed over 300' at 1.0 m/sec Baseline: TBD Goal status: INITIAL  3.  Improved L ankle dorsiflexion from -11 to 10 for improved gait pattern Baseline:  Goal status: INITIAL  4.  Strength L ankle improve to able to perform 15 B heel raises without pain Baseline:unable  Goal status: INITIAL    PLAN:  PT FREQUENCY: 1-2x/week  PT DURATION: 6 weeks  PLANNED INTERVENTIONS: 97110-Therapeutic exercises, 97530- Therapeutic activity, 97112- Neuromuscular re-education, 97535- Self Care, 23762- Manual therapy, Taping, Dry Needling, and Joint mobilization  PLAN FOR NEXT SESSION:   Recommend deep soft tissue work L plantarflexors, also progress eccentric training L ankle as tolerated, how has heel lift worked   Early Chars, PT, DPT, OCS 02/05/2023, 2:44 PM

## 2023-06-29 ENCOUNTER — Other Ambulatory Visit: Payer: Self-pay

## 2023-06-29 ENCOUNTER — Ambulatory Visit: Attending: Student | Admitting: Physical Therapy

## 2023-06-29 ENCOUNTER — Encounter: Payer: Self-pay | Admitting: Physical Therapy

## 2023-06-29 DIAGNOSIS — M6281 Muscle weakness (generalized): Secondary | ICD-10-CM | POA: Diagnosis present

## 2023-06-29 DIAGNOSIS — M7660 Achilles tendinitis, unspecified leg: Secondary | ICD-10-CM | POA: Insufficient documentation

## 2023-06-29 DIAGNOSIS — M25672 Stiffness of left ankle, not elsewhere classified: Secondary | ICD-10-CM | POA: Diagnosis present

## 2023-06-29 DIAGNOSIS — R29898 Other symptoms and signs involving the musculoskeletal system: Secondary | ICD-10-CM | POA: Diagnosis present

## 2023-06-29 DIAGNOSIS — R262 Difficulty in walking, not elsewhere classified: Secondary | ICD-10-CM | POA: Diagnosis present

## 2023-06-29 NOTE — Therapy (Signed)
 OUTPATIENT PHYSICAL THERAPY LOWER EXTREMITY EVALUATION   Patient Name: Terri Waters MRN: [id number] DOB:December 04, 1955, 68 y.o., female Today's Date: 06/29/2023  END OF SESSION:  PT End of Session - 06/29/23 1431     Visit Number 1    Number of Visits 16    Date for PT Re-Evaluation 08/24/23    Authorization Type [employer]    Progress Note Due on Visit 10    PT Start Time 1435    PT Stop Time 1515    PT Time Calculation (min) 40 min    Activity Tolerance Patient tolerated treatment well          Past Medical History:  Diagnosis Date   Cancer (HCC)    papillary carcinoma   Diabetes mellitus without complication (HCC)    High cholesterol    Hypertension    Hypothyroidism    Migraines    Stroke (HCC)    Thyroid  disease    Ventricular tachycardia (HCC)    Past Surgical History:  Procedure Laterality Date   ABDOMINAL HYSTERECTOMY     BREAST SURGERY     lumps removed - over 20 years ago   THYROIDECTOMY     TOTAL KNEE ARTHROPLASTY Right 12/18/2017   Procedure: RIGHT TOTAL KNEE ARTHROPLASTY;  Surgeon: Winston Hawking, MD;  Location: [facility] OR;  Service: Orthopedics;  Laterality: Right;   Patient Active Problem List   Diagnosis Date Noted   Dizziness 09/24/2020   AMS (altered mental status) 09/24/2020   Cerebellar infarct (HCC) 06/23/2019   Status post total knee replacement, 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 Has been doing exercises at home. Pt states things have been going pretty good. Still gets swelling in the whole foot. Medial heel pain is the worst. Pt is currently wearing the boot and takes it off ~1 hour or so. Went back to work last week  PERTINENT HISTORY: R TKA, cervical radiculopathy  PAIN:  Are you having pain? Yes: NPRS scale: 4 currently at rest, at worst 9 Pain location: back of heel and medial knee Pain description: can be dull or sharp, zings at night, throbs Aggravating factors: Standing and walking a lot Relieving factors: Ice, elevate  PRECAUTIONS: None Per [facility] referral order: Eccentric strengthening, gentle stretching, gait training From 06/02/23 note: We will have the patient transition into regular shoes. This should be a gradual progression. The patient should use pain as guide when ambulating in regular shoes and if pain is experienced the patient should spend more time in the Cam boot.Aaron AasAaron AasI provided her a light duty work note seated work primarily beginning on June 2...  RED FLAGS: None   WEIGHT BEARING RESTRICTIONS: No  FALLS:  Has patient fallen in last 6 months? No  LIVING ENVIRONMENT: Lives with: lives with their family husband and grandson Lives in: House/apartment Stairs: 3 steps to enter, 13 steps to upstairs Has following equipment at home: Otho Blitz - 4 wheeled and CAM boot  OCCUPATION: Full time -- Animal nutritionist for [employer]  PLOF: Independent  PATIENT GOALS: Return to normal walking  NEXT MD VISIT: 1 month reassessment  OBJECTIVE:  Note: Objective measures were completed at Evaluation unless otherwise noted.  DIAGNOSTIC FINDINGS: Nothing new in chart since surgery  PATIENT SURVEYS:  LEFS  Extreme difficulty/unable (0), Quite a bit of difficulty (1), Moderate difficulty (2), Little difficulty (3), No difficulty (4) Survey date:  06/29/23  Any of your usual work, housework or school activities 2  2. Usual hobbies, recreational or  sporting activities 4  3. Getting into/out of the bath 4  4. Walking between rooms 4  5. Putting on socks/shoes 4  6. Squatting  3  7. Lifting an object, like a bag of groceries from the floor 3  8. Performing light activities around your home 3  9. Performing heavy activities around your home 3  10. Getting into/out of a car 4  11. Walking 2 blocks 1  12. Walking 1 mile 1  13. Going up/down 10 stairs (1 flight) 3  14. Standing for 1 hour 1  15.  sitting for 1 hour 4  16. Running on even ground 1  17. Running on uneven ground 1  18. Making sharp turns while running fast 1  19. Hopping  4  20. Rolling over in bed 4  Score total:  55/80     COGNITION: Overall cognitive status: Within functional limits for tasks assessed     SENSATION: WFL  EDEMA:  L: 56 cm, R: 52 cm  MUSCLE LENGTH: See ankle DF for soleus length May need to measure gastroc length  POSTURE: No Significant postural limitations  PALPATION: TTP L posterior heel, medial gastroc/soleus, medial posterior heel distal to medial malleolus  LOWER EXTREMITY ROM:  Active ROM Right eval Left eval  Hip flexion    Hip extension    Hip abduction    Hip adduction    Hip internal rotation    Hip external rotation    Knee flexion    Knee extension    Ankle dorsiflexion 8 8  Ankle plantarflexion 45 22  Ankle inversion 25 20 pain on lateral malleolus  Ankle eversion 30 10   (Blank rows = not tested)  LOWER EXTREMITY MMT:  MMT Right eval Left eval  Hip flexion 5 5  Hip extension    Hip abduction    Hip adduction    Hip internal rotation    Hip external rotation    Knee flexion 5 4  Knee extension 5 4  Ankle dorsiflexion 5 At least 3+ limited due to tenderness/pain  Ankle plantarflexion 5 At least 3+ limited due to tenderness/pain  Ankle inversion    Ankle eversion     (Blank rows = not tested)  LOWER EXTREMITY SPECIAL TESTS:  Did not assess  FUNCTIONAL TESTS:  5 times sit to stand: 17.99 with  UE support SLS: 3 sec on L, 22.87 sec on R  GAIT: Distance walked: Into clinic Assistive device utilized: Walker - 4 wheeled, CAM boot Level of assistance: Modified independence Comments: Mildly antalgic, L LE slightly externally rotated during stance  TREATMENT DATE: 06/29/23  See HEP below   PATIENT EDUCATION:  Education details: Exam findings, POC, initial HEP, discussed RICE for edema Person educated: Patient Education method: Explanation, Demonstration, and Handouts Education comprehension: verbalized understanding, returned demonstration, and needs further education  HOME EXERCISE PROGRAM: Access Code: [email or web address] URL: [email or web address] Date: 06/29/2023 Prepared by: Shahin Knierim April Erman Hayward  Exercises - CLX Ankle Dorsiflexion and Eversion  - 1 x daily - 7 x weekly - 2 sets - 10 reps - Seated Ankle Inversion with Resistance  - 1 x daily - 7 x weekly - 2 sets - 10 reps - Seated Eccentric Ankle Plantar Flexion with Resistance - Straight Leg  - 1 x daily - 7 x weekly - 3 sets - 10 reps - Seated Eccentric Ankle Plantar Flexion with Resistance  - 1 x daily - 7 x weekly - 3 sets - 10 reps - Towel Scrunches  - 1 x daily - 7 x weekly - 3 sets - 10 reps  ASSESSMENT:  CLINICAL IMPRESSION: Patient is a 67 y.o. F who was seen today for physical therapy evaluation and treatment s/p L Achilles tendon reconstruction. Assessment is significant for decreased L foot/ankle ROM compared to R, pain, decreased gastroc/soleus strength, edema, and decreased balance affecting her ability to tolerate prolonged standing and walking for home and community tasks. Pt will benefit from PT to address these deficits to return to her normal function.   OBJECTIVE IMPAIRMENTS: Abnormal gait, decreased activity tolerance, decreased balance, decreased coordination,  decreased endurance, decreased mobility, difficulty walking, decreased ROM, decreased strength, increased edema, increased fascial restrictions, increased muscle spasms, improper body mechanics, postural dysfunction, obesity, and pain.   ACTIVITY LIMITATIONS: standing, squatting, stairs, transfers, and locomotion level  PARTICIPATION LIMITATIONS: cleaning, laundry, shopping, community activity, and yard work  PERSONAL FACTORS: Age, Fitness, Past/current experiences, and Time since onset of injury/illness/exacerbation are also affecting patient's functional outcome.   REHAB POTENTIAL: Good  CLINICAL DECISION MAKING: Evolving/moderate complexity  EVALUATION COMPLEXITY: Moderate   GOALS: Goals reviewed with patient? Yes  SHORT TERM GOALS: Target date: 07/27/2023  Pt will be ind with initial HEP Baseline: Goal status: INITIAL  2.  Pt will demo L = R ankle ROM Baseline:  Goal status: INITIAL  3.  Pt will report overall 50% improvement in her edema Baseline:  Goal status: INITIAL  LONG TERM GOALS: Target date: 08/24/2023   Pt will be ind with management and progression of HEP Baseline:  Goal status: INITIAL  2.  Pt will be able to tolerate L SLS for at least 10 sec to demo improving ankle strength/stability Baseline:  Goal status: INITIAL  3.  Pt will have improved 5x STS to </=13 sec to demo increased functional LE strength without UE use Baseline:  Goal status: INITIAL  4.  Pt will have improved LEFS to >/=64/80 to demo MCID Baseline:  Goal status: INITIAL  5.  Pt will be able to amb at least 1000' ind without any a/d for community amb Baseline:  Goal status: INITIAL  PLAN:  PT FREQUENCY: 2x/week  PT DURATION: 8 weeks  PLANNED INTERVENTIONS: 97164- PT Re-evaluation, 97750- Physical Performance Testing, 97110-Therapeutic exercises, 97530- Therapeutic activity, 97112- Neuromuscular re-education, 97535- Self Care, [id number]- Manual therapy, [id number]- Gait training, [phone]-  Aquatic Therapy, [phone]- Electrical stimulation (unattended), [id number]- Ultrasound, [id number]- Ionotophoresis 4mg /ml Dexamethasone, 25366 (1-2 muscles), 20561 (3+ muscles)- Dry Needling, Patient/Family education, Balance training, Stair training, Taping, Joint mobilization, Cryotherapy, and Moist heat  PLAN FOR NEXT SESSION: Assess response to HEP.  Continue to progress eccentric ankle strengthening, ROM/stretching, gross LE strengthening   Beau [place] April Ma L Henery Betzold, PT, DPT 06/29/2023, 3:30 PM

## 2023-07-01 ENCOUNTER — Ambulatory Visit

## 2023-07-01 DIAGNOSIS — R262 Difficulty in walking, not elsewhere classified: Secondary | ICD-10-CM

## 2023-07-01 DIAGNOSIS — M25672 Stiffness of left ankle, not elsewhere classified: Secondary | ICD-10-CM

## 2023-07-01 DIAGNOSIS — M7660 Achilles tendinitis, unspecified leg: Secondary | ICD-10-CM | POA: Diagnosis not present

## 2023-07-01 NOTE — Therapy (Signed)
 OUTPATIENT PHYSICAL THERAPY LOWER EXTREMITY TREATMENT   Patient Name: Terri Waters MRN: [id number] DOB:September 18, 1955, 68 y.o., female Today's Date: 07/01/2023  END OF SESSION:  PT End of Session - 07/01/23 1613     Visit Number 2    Number of Visits 16    Date for PT Re-Evaluation 08/24/23    Authorization Type [employer]    Progress Note Due on Visit 10    PT Start Time 1534    PT Stop Time 1613    PT Time Calculation (min) 39 min    Activity Tolerance Patient tolerated treatment well    Behavior During Therapy WFL for tasks assessed/performed           Past Medical History:  Diagnosis Date   Cancer (HCC)    papillary carcinoma   Diabetes mellitus without complication (HCC)    High cholesterol    Hypertension    Hypothyroidism    Migraines    Stroke (HCC)    Thyroid  disease    Ventricular tachycardia (HCC)    Past Surgical History:  Procedure Laterality Date   ABDOMINAL HYSTERECTOMY     BREAST SURGERY     lumps removed - over 20 years ago   THYROIDECTOMY     TOTAL KNEE ARTHROPLASTY Right 12/18/2017   Procedure: RIGHT TOTAL KNEE ARTHROPLASTY;  Surgeon: Winston Hawking, MD;  Location: [facility] OR;  Service: Orthopedics;  Laterality: Right;   Patient Active Problem List   Diagnosis Date Noted   Dizziness 09/24/2020   AMS (altered mental status) 09/24/2020   Cerebellar infarct (HCC) 06/23/2019   Status post total knee replacement, 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   Migraine with aura and without status migrainosus, not intractable 04/09/2017    PCP: Rada Buerger, MD  REFERRING PROVIDER: Debbra Fairy, PA-C  REFERRING DIAG: [phone] (ICD-10-CM) - Achilles tendinitis, left leg  THERAPY DIAG:  Insertional Achilles tendinopathy  Stiffness of left ankle, not elsewhere classified  Difficulty in walking, not elsewhere classified  Rationale for Evaluation and Treatment: Rehabilitation  ONSET DATE: 04/21/23 s/p L achilles tendon reconstruction  SUBJECTIVE:   SUBJECTIVE STATEMENT: Pt  reports going to see PA today, she is now out of CAM boot  PERTINENT HISTORY: R TKA, cervical radiculopathy  PAIN:  Are you having pain? Yes: NPRS scale: 4 currently at rest, at worst 9 Pain location: back of heel and medial knee Pain description: can be dull or sharp, zings at night, throbs Aggravating factors: Standing and walking a lot Relieving factors: Ice, elevate  PRECAUTIONS: None Per [facility] referral order: Eccentric strengthening, gentle stretching, gait training From 06/02/23 note: We will have the patient transition into regular shoes. This should be a gradual progression. The patient should use pain as guide when ambulating in regular shoes and if pain is experienced the patient should spend more time in the Cam boot.Aaron AasAaron AasI provided her a light duty work note seated work primarily beginning on June 2...  RED FLAGS: None   WEIGHT BEARING RESTRICTIONS: No  FALLS:  Has patient fallen in last 6 months? No  LIVING ENVIRONMENT: Lives with: lives with their family husband and grandson Lives in: House/apartment Stairs: 3 steps to enter, 13 steps to upstairs Has following equipment at home: Otho Blitz - 4 wheeled and CAM boot  OCCUPATION: Full time -- Animal nutritionist for [employer]  PLOF: Independent  PATIENT GOALS: Return to normal walking  NEXT MD VISIT: 1 month reassessment  OBJECTIVE:  Note: Objective measures were completed at Evaluation unless otherwise noted.  DIAGNOSTIC FINDINGS: Nothing  new in chart since surgery  PATIENT SURVEYS:  LEFS  Extreme difficulty/unable (0), Quite a bit of difficulty (1), Moderate difficulty (2), Little difficulty (3), No difficulty (4) Survey date:  06/29/23  Any of your usual work, housework or school activities 2  2. Usual hobbies, recreational or sporting activities 4  3. Getting into/out of the bath 4  4. Walking between rooms 4  5. Putting on socks/shoes 4  6. Squatting  3  7. Lifting an object, like a bag of groceries from the floor 3   8. Performing light activities around your home 3  9. Performing heavy activities around your home 3  10. Getting into/out of a car 4  11. Walking 2 blocks 1  12. Walking 1 mile 1  13. Going up/down 10 stairs (1 flight) 3  14. Standing for 1 hour 1  15.  sitting for 1 hour 4  16. Running on even ground 1  17. Running on uneven ground 1  18. Making sharp turns while running fast 1  19. Hopping  4  20. Rolling over in bed 4  Score total:  55/80     COGNITION: Overall cognitive status: Within functional limits for tasks assessed     SENSATION: WFL  EDEMA:  L: 56 cm, R: 52 cm  MUSCLE LENGTH: See ankle DF for soleus length May need to measure gastroc length  POSTURE: No Significant postural limitations  PALPATION: TTP L posterior heel, medial gastroc/soleus, medial posterior heel distal to medial malleolus  LOWER EXTREMITY ROM:  Active ROM Right eval Left eval  Hip flexion    Hip extension    Hip abduction    Hip adduction    Hip internal rotation    Hip external rotation    Knee flexion    Knee extension    Ankle dorsiflexion 8 8  Ankle plantarflexion 45 22  Ankle inversion 25 20 pain on lateral malleolus  Ankle eversion 30 10   (Blank rows = not tested)  LOWER EXTREMITY MMT:  MMT Right eval Left eval  Hip flexion 5 5  Hip extension    Hip abduction    Hip adduction    Hip internal rotation    Hip external rotation    Knee flexion 5 4  Knee extension 5 4  Ankle dorsiflexion 5 At least 3+ limited due to tenderness/pain  Ankle plantarflexion 5 At least 3+ limited due to tenderness/pain  Ankle inversion    Ankle eversion     (Blank rows = not tested)  LOWER EXTREMITY SPECIAL TESTS:  Did not assess  FUNCTIONAL TESTS:  5 times sit to stand: 17.99 with UE support SLS: 3 sec on L, 22.87 sec on R  GAIT: Distance walked: Into clinic Assistive device utilized: Walker - 4 wheeled, CAM boot Level of assistance: Modified independence Comments:  Mildly antalgic, L LE slightly externally rotated during stance  TREATMENT DATE:  07/01/23 Nustep L4x87min Ankle IV,EV,DF RTB  Eccentric PF with RTB knee bent and straight Towel scrunch Gastroc stretch w/ towel longsitting; soleus stretch same position Gait training- cues for toe off, antalgic gait Seated ball squeeze 10x5; with LAQ x 10 B  06/29/23  See HEP below   PATIENT EDUCATION:  Education details: Exam findings, POC, initial HEP, discussed RICE for edema Person educated: Patient Education method: Explanation, Demonstration, and Handouts Education comprehension: verbalized understanding, returned demonstration, and needs further education  HOME EXERCISE PROGRAM: Access Code: [email or web address] URL: [email or web address] Date: 06/29/2023 Prepared by: Gellen April Erman Hayward  Exercises - CLX Ankle Dorsiflexion and Eversion  - 1 x daily - 7 x weekly - 2 sets - 10 reps - Seated Ankle Inversion with Resistance  - 1 x daily - 7 x weekly - 2 sets - 10 reps - Seated Eccentric Ankle Plantar Flexion with Resistance - Straight Leg  - 1 x daily - 7 x weekly - 3 sets - 10 reps - Seated Eccentric Ankle Plantar Flexion with Resistance  - 1 x daily - 7 x weekly - 3 sets - 10 reps - Towel Scrunches  - 1 x daily - 7 x weekly - 3 sets - 10 reps  ASSESSMENT:  CLINICAL IMPRESSION: Progressed exercises and reviewed HEP. Provided cues as needed to correct form. Also instructed on proper gait pattern as she lacks toe off during gait. She is now out of CAM boot at her own discretion per MD office.    Eval: Patient is a 68 y.o. F who was seen today for physical therapy evaluation and treatment s/p L Achilles tendon reconstruction. Assessment is significant for decreased L foot/ankle ROM compared to R, pain, decreased gastroc/soleus strength, edema, and decreased  balance affecting her ability to tolerate prolonged standing and walking for home and community tasks. Pt will benefit from PT to address these deficits to return to her normal function.   OBJECTIVE IMPAIRMENTS: Abnormal gait, decreased activity tolerance, decreased balance, decreased coordination, decreased endurance, decreased mobility, difficulty walking, decreased ROM, decreased strength, increased edema, increased fascial restrictions, increased muscle spasms, improper body mechanics, postural dysfunction, obesity, and pain.   ACTIVITY LIMITATIONS: standing, squatting, stairs, transfers, and locomotion level  PARTICIPATION LIMITATIONS: cleaning, laundry, shopping, community activity, and yard work  PERSONAL FACTORS: Age, Fitness, Past/current experiences, and Time since onset of injury/illness/exacerbation are also affecting patient's functional outcome.   REHAB POTENTIAL: Good  CLINICAL DECISION MAKING: Evolving/moderate complexity  EVALUATION COMPLEXITY: Moderate   GOALS: Goals reviewed with patient? Yes  SHORT TERM GOALS: Target date: 07/27/2023  Pt will be ind with initial HEP Baseline: Goal status: INITIAL  2.  Pt will demo L = R ankle ROM Baseline:  Goal status: INITIAL  3.  Pt will report overall 50% improvement in her edema Baseline:  Goal status: INITIAL  LONG TERM GOALS: Target date: 08/24/2023   Pt will be ind with management and progression of HEP Baseline:  Goal status: INITIAL  2.  Pt will be able to tolerate L SLS for at least 10 sec to demo improving ankle strength/stability Baseline:  Goal status: INITIAL  3.  Pt will have improved 5x STS to </=13 sec to demo increased functional LE strength without UE use Baseline:  Goal status: INITIAL  4.  Pt will have improved LEFS to >/=64/80 to demo MCID Baseline:  Goal status: INITIAL  5.  Pt will be able to amb at least 1000' ind without any a/d for community  amb Baseline:  Goal status:  INITIAL  PLAN:  PT FREQUENCY: 2x/week  PT DURATION: 8 weeks  PLANNED INTERVENTIONS: 97164- PT Re-evaluation, 97750- Physical Performance Testing, 97110-Therapeutic exercises, 97530- Therapeutic activity, [id number]- Neuromuscular re-education, 97535- Self Care, [id number]- Manual therapy, [phone]- Gait training, [phone]- Aquatic Therapy, [phone]- Electrical stimulation (unattended), [id number]- Ultrasound, [id number]- Ionotophoresis 4mg /ml Dexamethasone, [id number] (1-2 muscles), 20561 (3+ muscles)- Dry Needling, Patient/Family education, Balance training, Stair training, Taping, Joint mobilization, Cryotherapy, and Moist heat  PLAN FOR NEXT SESSION: Assess response to HEP. Continue to progress eccentric ankle strengthening, ROM/stretching, gross LE strengthening   Davy Faught L Dalton Mille, PTA 07/01/2023, 4:13 PM

## 2023-07-08 ENCOUNTER — Ambulatory Visit: Admitting: Physical Therapy

## 2023-07-08 DIAGNOSIS — M25672 Stiffness of left ankle, not elsewhere classified: Secondary | ICD-10-CM

## 2023-07-08 DIAGNOSIS — M7660 Achilles tendinitis, unspecified leg: Secondary | ICD-10-CM | POA: Diagnosis not present

## 2023-07-08 DIAGNOSIS — R29898 Other symptoms and signs involving the musculoskeletal system: Secondary | ICD-10-CM

## 2023-07-08 DIAGNOSIS — R262 Difficulty in walking, not elsewhere classified: Secondary | ICD-10-CM

## 2023-07-08 DIAGNOSIS — M6281 Muscle weakness (generalized): Secondary | ICD-10-CM

## 2023-07-08 NOTE — Therapy (Signed)
 OUTPATIENT PHYSICAL THERAPY LOWER EXTREMITY TREATMENT   Patient Name: KARENANN MCGRORY MRN: [id number] DOB:1956/01/03, 68 y.o., female Today's Date: 07/08/2023  END OF SESSION:  PT End of Session - 07/08/23 1446     Visit Number 3    Number of Visits 16    Date for PT Re-Evaluation 08/24/23    Authorization Type U[employer]    Progress Note Due on Visit 10    PT Start Time 1446    PT Stop Time 1525    PT Time Calculation (min) 39 min    Activity Tolerance Patient tolerated treatment well    Behavior During Therapy WFL for tasks assessed/performed            Past Medical History:  Diagnosis Date   Cancer (HCC)    papillary carcinoma   Diabetes mellitus without complication (HCC)    High cholesterol    Hypertension    Hypothyroidism    Migraines    Stroke (HCC)    Thyroid  disease    Ventricular tachycardia (HCC)    Past Surgical History:  Procedure Laterality Date   ABDOMINAL HYSTERECTOMY     BREAST SURGERY     lumps removed - over 20 years ago   THYROIDECTOMY     TOTAL KNEE ARTHROPLASTY Right 12/18/2017   Procedure: RIGHT TOTAL KNEE ARTHROPLASTY;  Surgeon: Kay Kemps, MD;  Location: [facility] OR;  Service: Orthopedics;  Laterality: Right;   Patient Active Problem List   Diagnosis Date Noted   Dizziness 09/24/2020   AMS (altered mental status) 09/24/2020   Cerebellar infarct (HCC) 06/23/2019   Status post total knee replacement, 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   Migraine with aura and without status migrainosus, not intractable 04/09/2017    PCP: [name], MD  REFERRING PROVIDER: Aniceto Eva Grebe, PA-C  REFERRING DIAG: [phone] (ICD-10-CM) - Achilles tendinitis, left leg  THERAPY DIAG:  Insertional Achilles tendinopathy  Stiffness of left ankle, not elsewhere classified  Difficulty in walking, not elsewhere classified  Muscle weakness (generalized)  Ankle weakness  Rationale for Evaluation and Treatment: Rehabilitation  ONSET DATE: 04/21/23 s/p L achilles tendon  reconstruction  SUBJECTIVE:   SUBJECTIVE STATEMENT: Pt states she had been walking without CAM boot and using her normal shoe but her foot/ankle swelled up and hurt like a toothache so she's back in her boot. Exercises have been going well. Has not found a good set up for her desk at home where she can keep her foot elevated and has not yet been wearing her compression socks.   PERTINENT HISTORY: R TKA, cervical radiculopathy  PAIN:  Are you having pain? Yes: NPRS scale: 5 earlier today, 0 currently (took tylenol ) 07/08/23 Pain location: back of heel and medial knee Pain description: can be dull or sharp, zings at night, throbs Aggravating factors: Standing and walking a lot Relieving factors: Ice, elevate  PRECAUTIONS: None Per [facility] referral order: Eccentric strengthening, gentle stretching, gait training From 06/02/23 note: We will have the patient transition into regular shoes. This should be a gradual progression. The patient should use pain as guide when ambulating in regular shoes and if pain is experienced the patient should spend more time in the Cam boot.SABRASABRAI provided her a light duty work note seated work primarily beginning on June 2...  RED FLAGS: None   WEIGHT BEARING RESTRICTIONS: No  FALLS:  Has patient fallen in last 6 months? 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  DIAGNOSTIC FINDINGS: Nothing new in chart since surgery  PATIENT SURVEYS:  LEFS  Extreme difficulty/unable (0), Quite a bit of difficulty (1), Moderate difficulty (2), Little  difficulty (3), No difficulty (4) Survey date:  06/29/23  Any of your usual work, housework or school activities 2  2. Usual hobbies, recreational or sporting activities 4  3. Getting into/out of the bath 4  4. Walking between rooms 4  5. Putting on socks/shoes 4  6. Squatting  3  7. Lifting an object, like a bag of groceries from the floor 3  8. Performing light activities around your home 3  9. Performing heavy activities around your home 3  10. Getting into/out of a car 4  11. Walking 2 blocks 1  12. Walking 1 mile 1  13. Going up/down 10 stairs (1 flight) 3  14. Standing for 1 hour 1  15.  sitting for 1 hour 4  16. Running on even ground 1  17. Running on uneven ground 1  18. Making sharp turns while running fast 1  19. Hopping  4  20. Rolling over in bed 4  Score total:  55/80     COGNITION: Overall cognitive status: Within functional limits for tasks assessed     SENSATION: WFL  EDEMA:  L: 56 cm, R: 52 cm  MUSCLE LENGTH: See ankle DF for soleus length May need to measure gastroc length  POSTURE: No Significant postural limitations  PALPATION: TTP L posterior heel, medial gastroc/soleus, medial posterior heel distal to medial malleolus  LOWER EXTREMITY ROM:  Active ROM Right eval Left eval  Hip flexion    Hip extension    Hip abduction    Hip adduction    Hip internal rotation    Hip external rotation    Knee flexion    Knee extension    Ankle dorsiflexion 8 8  Ankle plantarflexion 45 22  Ankle inversion 25 20 pain on lateral malleolus  Ankle eversion 30 10   (Blank rows = not tested)  LOWER EXTREMITY MMT:  MMT Right eval Left eval  Hip flexion 5 5  Hip extension    Hip abduction    Hip adduction    Hip internal rotation    Hip external rotation    Knee flexion 5 4  Knee extension 5 4  Ankle dorsiflexion 5 At least 3+ limited due to tenderness/pain  Ankle plantarflexion 5 At least 3+ limited due to tenderness/pain  Ankle inversion     Ankle eversion     (Blank rows = not tested)  LOWER EXTREMITY SPECIAL TESTS:  Did not assess  FUNCTIONAL TESTS:  5 times sit to stand: 17.99 with UE support SLS: 3 sec on L, 22.87 sec on R  GAIT: Distance walked: Into clinic Assistive device utilized: Walker - 4 wheeled, CAM boot Level of assistance: Modified independence Comments: Mildly antalgic, L LE slightly externally rotated during stance  TREATMENT DATE:  07/08/23 Nustep L5 x 6 min Seated heel slide x20 Seated eccentric heel raise 2x10 Ankle IV, EV RTB 2x10 Seated eccentric PF knee straight red TB 2x10 Seated marble pick up 2x10 Seated toe yoga 2x10 Seated arch lift x10 Standing arch lift x10 Standing hip abd 2x10  07/01/23 Nustep L4x52min Ankle IV,EV,DF RTB  Eccentric PF with RTB knee bent and straight Towel scrunch Gastroc stretch w/ towel longsitting; soleus stretch same position Gait training- cues for toe off, antalgic gait Seated ball squeeze 10x5; with LAQ x 10 B  06/29/23  See HEP below   PATIENT EDUCATION:  Education details: Exam findings, POC, initial HEP, discussed RICE for edema Person educated: Patient Education method: Explanation, Demonstration, and Handouts Education comprehension: verbalized understanding, returned demonstration, and needs further education  HOME EXERCISE PROGRAM: Access Code: [email or web address] URL: [email or web address] Date: 06/29/2023 Prepared by: Temeka Pore April Earnie Starring  Exercises - CLX Ankle Dorsiflexion and Eversion  - 1 x daily - 7 x weekly - 2 sets - 10 reps - Seated Ankle Inversion with Resistance  - 1 x daily - 7 x weekly - 2 sets - 10 reps - Seated Eccentric Ankle Plantar Flexion with Resistance - Straight Leg  - 1 x daily - 7 x weekly - 3 sets - 10 reps - Seated Eccentric Ankle Plantar Flexion with Resistance  - 1 x daily -  7 x weekly - 3 sets - 10 reps - Towel Scrunches  - 1 x daily - 7 x weekly - 3 sets - 10 reps  ASSESSMENT:  CLINICAL IMPRESSION: Treatment focused on reviewing eccentric exercises once again and her ankle inversion exercises (has not been doing them correctly at home). Worked on continuing to improve foot intrinsic strength and stability. Added gentle standing/weightbearing. Discussed with pt using her compression and heel lift in her normal shoes and then slowly wean off of it as she has been getting too much swelling when wearing just the shoe only.   Eval: Patient is a 68 y.o. F who was seen today for physical therapy evaluation and treatment s/p L Achilles tendon reconstruction. Assessment is significant for decreased L foot/ankle ROM compared to R, pain, decreased gastroc/soleus strength, edema, and decreased balance affecting her ability to tolerate prolonged standing and walking for home and community tasks. Pt will benefit from PT to address these deficits to return to her normal function.   OBJECTIVE IMPAIRMENTS: Abnormal gait, decreased activity tolerance, decreased balance, decreased coordination, decreased endurance, decreased mobility, difficulty walking, decreased ROM, decreased strength, increased edema, increased fascial restrictions, increased muscle spasms, improper body mechanics, postural dysfunction, obesity, and pain.   ACTIVITY LIMITATIONS: standing, squatting, stairs, transfers, and locomotion level  PARTICIPATION LIMITATIONS: cleaning, laundry, shopping, community activity, and yard work  PERSONAL FACTORS: Age, Fitness, Past/current experiences, and Time since onset of injury/illness/exacerbation are also affecting patient's functional outcome.   REHAB POTENTIAL: Good  CLINICAL DECISION MAKING: Evolving/moderate complexity  EVALUATION COMPLEXITY: Moderate   GOALS: Goals reviewed with patient? Yes  SHORT TERM GOALS: Target date: 07/27/2023  Pt will be ind with  initial HEP Baseline: Goal status: INITIAL  2.  Pt will demo L = R ankle ROM Baseline:  Goal status: INITIAL  3.  Pt will report overall 50% improvement in her edema Baseline:  Goal status: INITIAL  LONG TERM GOALS: Target date: 08/24/2023   Pt will be ind with management and progression of HEP Baseline:  Goal status: INITIAL  2.  Pt will be able to tolerate  L SLS for at least 10 sec to demo improving ankle strength/stability Baseline:  Goal status: INITIAL  3.  Pt will have improved 5x STS to </=13 sec to demo increased functional LE strength without UE use Baseline:  Goal status: INITIAL  4.  Pt will have improved LEFS to >/=64/80 to demo MCID Baseline:  Goal status: INITIAL  5.  Pt will be able to amb at least 1000' ind without any a/d for community amb Baseline:  Goal status: INITIAL  PLAN:  PT FREQUENCY: 2x/week  PT DURATION: 8 weeks  PLANNED INTERVENTIONS: 97164- PT Re-evaluation, 97750- Physical Performance Testing, 97110-Therapeutic exercises, 97530- Therapeutic activity, 97112- Neuromuscular re-education, 97535- Self Care, 02859- Manual therapy, [id number]- Gait training, [phone]- Aquatic Therapy, [phone]- Electrical stimulation (unattended), [id number]- Ultrasound, [id number]- Ionotophoresis 4mg /ml Dexamethasone, 79439 (1-2 muscles), 20561 (3+ muscles)- Dry Needling, Patient/Family education, Balance training, Stair training, Taping, Joint mobilization, Cryotherapy, and Moist heat  PLAN FOR NEXT SESSION: Assess response to HEP. Continue to progress eccentric ankle strengthening, ROM/stretching, gross LE strengthening. Consider k tape for stability when pt is in her shoe as well as wearing her heel lift.    Dulcie Gammon April Ma L Martha Soltys, PT, DPT 07/08/2023, 2:46 PM

## 2023-07-20 ENCOUNTER — Ambulatory Visit: Attending: Student | Admitting: Physical Therapy

## 2023-07-20 ENCOUNTER — Encounter: Payer: Self-pay | Admitting: Physical Therapy

## 2023-07-20 DIAGNOSIS — M6281 Muscle weakness (generalized): Secondary | ICD-10-CM | POA: Diagnosis present

## 2023-07-20 DIAGNOSIS — R29898 Other symptoms and signs involving the musculoskeletal system: Secondary | ICD-10-CM | POA: Insufficient documentation

## 2023-07-20 DIAGNOSIS — M7660 Achilles tendinitis, unspecified leg: Secondary | ICD-10-CM | POA: Diagnosis present

## 2023-07-20 DIAGNOSIS — R262 Difficulty in walking, not elsewhere classified: Secondary | ICD-10-CM | POA: Diagnosis present

## 2023-07-20 DIAGNOSIS — M25672 Stiffness of left ankle, not elsewhere classified: Secondary | ICD-10-CM | POA: Insufficient documentation

## 2023-07-20 NOTE — Therapy (Signed)
 OUTPATIENT PHYSICAL THERAPY LOWER EXTREMITY TREATMENT   Patient Name: CYNTHEA ZACHMAN MRN: [id number] DOB:01-Oct-1955, 68 y.o., female Today's Date: 07/20/2023  END OF SESSION:  PT End of Session - 07/20/23 1400     Visit Number 4    Number of Visits 16    Date for PT Re-Evaluation 08/24/23    Authorization Type U[employer]    Progress Note Due on Visit 10    PT Start Time 1400    PT Stop Time 1440    PT Time Calculation (min) 40 min    Activity Tolerance Patient tolerated treatment well    Behavior During Therapy WFL for tasks assessed/performed             Past Medical History:  Diagnosis Date   Cancer (HCC)    papillary carcinoma   Diabetes mellitus without complication (HCC)    High cholesterol    Hypertension    Hypothyroidism    Migraines    Stroke (HCC)    Thyroid  disease    Ventricular tachycardia (HCC)    Past Surgical History:  Procedure Laterality Date   ABDOMINAL HYSTERECTOMY     BREAST SURGERY     lumps removed - over 20 years ago   THYROIDECTOMY     TOTAL KNEE ARTHROPLASTY Right 12/18/2017   Procedure: RIGHT TOTAL KNEE ARTHROPLASTY;  Surgeon: Kay Kemps, MD;  Location: [facility] OR;  Service: Orthopedics;  Laterality: Right;   Patient Active Problem List   Diagnosis Date Noted   Dizziness 09/24/2020   AMS (altered mental status) 09/24/2020   Cerebellar infarct (HCC) 06/23/2019   Status post total knee replacement, 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   Migraine with aura and without status migrainosus, not intractable 04/09/2017    PCP: [name], MD  REFERRING PROVIDER: Aniceto Eva Grebe, PA-C  REFERRING DIAG: [id number] (ICD-10-CM) - Achilles tendinitis, left leg  THERAPY DIAG:  Insertional Achilles tendinopathy  Stiffness of left ankle, not elsewhere classified  Difficulty in walking, not elsewhere classified  Muscle weakness (generalized)  Ankle weakness  Rationale for Evaluation and Treatment: Rehabilitation  ONSET DATE: 04/21/23 s/p L achilles tendon  reconstruction  SUBJECTIVE:   SUBJECTIVE STATEMENT: Pt states it still swells quite a bit but has been wearing compression hose. Has not been able to wear the shoe because of the swelling. Over the weekend it got really big. Pain along lateral malleolus. Has been doing the exercises.   PERTINENT HISTORY: R TKA, cervical radiculopathy  PAIN:  Are you having pain? Yes: NPRS scale: 5 earlier today, 0 currently (took tylenol ) 07/08/23 Pain location: back of heel and medial knee Pain description: can be dull or sharp, zings at night, throbs Aggravating factors: Standing and walking a lot Relieving factors: Ice, elevate  PRECAUTIONS: None Per [facility] referral order: Eccentric strengthening, gentle stretching, gait training From 06/02/23 note: We will have the patient transition into regular shoes. This should be a gradual progression. The patient should use pain as guide when ambulating in regular shoes and if pain is experienced the patient should spend more time in the Cam boot.SABRASABRAI provided her a light duty work note seated work primarily beginning on June 2...  RED FLAGS: None   WEIGHT BEARING RESTRICTIONS: No  FALLS:  Has patient fallen in last 6 months? 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  DIAGNOSTIC FINDINGS: Nothing new in chart since surgery  PATIENT SURVEYS:  LEFS  Extreme difficulty/unable (0), Quite a bit of difficulty (1), Moderate difficulty (2), Little difficulty (3), No difficulty (4) Survey date:  06/29/23  Any of your usual work, housework or school activities  2  2. Usual hobbies, recreational or sporting activities 4  3. Getting into/out of the bath 4  4. Walking between rooms 4  5. Putting on socks/shoes 4  6. Squatting  3  7. Lifting an object, like a bag of groceries from the floor 3  8. Performing light activities around your home 3  9. Performing heavy activities around your home 3  10. Getting into/out of a car 4  11. Walking 2 blocks 1  12. Walking 1 mile 1  13. Going up/down 10 stairs (1 flight) 3  14. Standing for 1 hour 1  15.  sitting for 1 hour 4  16. Running on even ground 1  17. Running on uneven ground 1  18. Making sharp turns while running fast 1  19. Hopping  4  20. Rolling over in bed 4  Score total:  55/80     COGNITION: Overall cognitive status: Within functional limits for tasks assessed     SENSATION: WFL  EDEMA:  L: 56 cm, R: 52 cm  MUSCLE LENGTH: See ankle DF for soleus length May need to measure gastroc length  POSTURE: No Significant postural limitations  PALPATION: TTP L posterior heel, medial gastroc/soleus, medial posterior heel distal to medial malleolus  LOWER EXTREMITY ROM:  Active ROM Right eval Left eval  Hip flexion    Hip extension    Hip abduction    Hip adduction    Hip internal rotation    Hip external rotation    Knee flexion    Knee extension    Ankle dorsiflexion 8 8  Ankle plantarflexion 45 22  Ankle inversion 25 20 pain on lateral malleolus  Ankle eversion 30 10   (Blank rows = not tested)  LOWER EXTREMITY MMT:  MMT Right eval Left eval  Hip flexion 5 5  Hip extension    Hip abduction    Hip adduction    Hip internal rotation    Hip external rotation    Knee flexion 5 4  Knee extension 5 4  Ankle dorsiflexion 5 At least 3+ limited due to tenderness/pain  Ankle plantarflexion 5 At least 3+ limited due to tenderness/pain  Ankle inversion    Ankle eversion     (Blank rows = not tested)  LOWER EXTREMITY SPECIAL TESTS:  Did not assess  FUNCTIONAL  TESTS:  5 times sit to stand: 17.99 with UE support SLS: 3 sec on L, 22.87 sec on R  GAIT: Distance walked: Into clinic Assistive device utilized: Walker - 4 wheeled, CAM boot Level of assistance: Modified independence Comments: Mildly antalgic, L LE slightly externally rotated during stance  TREATMENT DATE:  07/20/23 Nustep L4 x 6 min Standing gentle gastroc stretch x 30 Seated soleus stretch with strap x 30 Seated toe yoga 2x10 IASTM with massage roller gastroc/soleus and peroneals Seated ankle ev/df green TB 2x10 Seated ankle inv green TB 2x10 Seated eccentric plantar flexion knee straight and then knee bent green TB 2x10 Vaso R ankle medium compression, x10 min, 34 deg temp, LE elevated  07/08/23 Nustep L5 x 6 min Seated heel slide x20 Seated eccentric heel raise 2x10 Ankle IV, EV RTB 2x10 Seated eccentric PF knee straight red TB 2x10 Seated marble pick up 2x10 Seated toe yoga 2x10 Seated arch lift x10 Standing arch lift x10 Standing hip abd 2x10  07/01/23 Nustep L4x39min Ankle IV,EV,DF RTB  Eccentric PF with RTB knee bent and straight Towel scrunch Gastroc stretch w/ towel longsitting; soleus stretch same position Gait training- cues for toe off, antalgic gait Seated ball squeeze 10x5; with LAQ x 10 B  06/29/23  See HEP below   PATIENT EDUCATION:  Education details: Exam findings, POC, initial HEP, discussed RICE for edema Person educated: Patient Education method: Explanation, Demonstration, and Handouts Education comprehension: verbalized understanding, returned demonstration, and needs further education  HOME EXERCISE PROGRAM: Access Code: [email or web address] URL: [email or web address] Date: 06/29/2023 Prepared by: Maleyah Evans April Earnie Starring  Exercises - CLX Ankle Dorsiflexion and Eversion  - 1 x daily - 7 x weekly - 2 sets  - 10 reps - Seated Ankle Inversion with Resistance  - 1 x daily - 7 x weekly - 2 sets - 10 reps - Seated Eccentric Ankle Plantar Flexion with Resistance - Straight Leg  - 1 x daily - 7 x weekly - 3 sets - 10 reps - Seated Eccentric Ankle Plantar Flexion with Resistance  - 1 x daily - 7 x weekly - 3 sets - 10 reps - Towel Scrunches  - 1 x daily - 7 x weekly - 3 sets - 10 reps  ASSESSMENT:  CLINICAL IMPRESSION: Manual therapy provided to help ease pt's medial and lateral ankle pain. Progressed to green TB with good pt tolerance. Added vaso to help manage edema.   Eval: Patient is a 68 y.o. F who was seen today for physical therapy evaluation and treatment s/p L Achilles tendon reconstruction. Assessment is significant for decreased L foot/ankle ROM compared to R, pain, decreased gastroc/soleus strength, edema, and decreased balance affecting her ability to tolerate prolonged standing and walking for home and community tasks. Pt will benefit from PT to address these deficits to return to her normal function.   OBJECTIVE IMPAIRMENTS: Abnormal gait, decreased activity tolerance, decreased balance, decreased coordination, decreased endurance, decreased mobility, difficulty walking, decreased ROM, decreased strength, increased edema, increased fascial restrictions, increased muscle spasms, improper body mechanics, postural dysfunction, obesity, and pain.   ACTIVITY LIMITATIONS: standing, squatting, stairs, transfers, and locomotion level  PARTICIPATION LIMITATIONS: cleaning, laundry, shopping, community activity, and yard work  PERSONAL FACTORS: Age, Fitness, Past/current experiences, and Time since onset of injury/illness/exacerbation are also affecting patient's functional outcome.   REHAB POTENTIAL: Good  CLINICAL DECISION MAKING: Evolving/moderate complexity  EVALUATION COMPLEXITY: Moderate   GOALS: Goals reviewed with patient? Yes  SHORT TERM GOALS: Target date: 07/27/2023  Pt will be  ind with initial HEP Baseline: Goal status: INITIAL  2.  Pt will demo L = R ankle ROM Baseline:  Goal status: INITIAL  3.  Pt will report overall 50% improvement in her edema Baseline:  Goal status: INITIAL  LONG TERM GOALS: Target date: 08/24/2023  Pt will be ind with management and progression of HEP Baseline:  Goal status: INITIAL  2.  Pt will be able to tolerate L SLS for at least 10 sec to demo improving ankle strength/stability Baseline:  Goal status: INITIAL  3.  Pt will have improved 5x STS to </=13 sec to demo increased functional LE strength without UE use Baseline:  Goal status: INITIAL  4.  Pt will have improved LEFS to >/=64/80 to demo MCID Baseline:  Goal status: INITIAL  5.  Pt will be able to amb at least 1000' ind without any a/d for community amb Baseline:  Goal status: INITIAL  PLAN:  PT FREQUENCY: 2x/week  PT DURATION: 8 weeks  PLANNED INTERVENTIONS: 97164- PT Re-evaluation, 97750- Physical Performance Testing, 97110-Therapeutic exercises, 97530- Therapeutic activity, 97112- Neuromuscular re-education, 97535- Self Care, 02859- Manual therapy, [id number]- Gait training, [phone]- Aquatic Therapy, [phone]- Electrical stimulation (unattended), [id number]- Ultrasound, [id number]- Ionotophoresis 4mg /ml Dexamethasone, 79439 (1-2 muscles), 20561 (3+ muscles)- Dry Needling, Patient/Family education, Balance training, Stair training, Taping, Joint mobilization, Cryotherapy, and Moist heat  PLAN FOR NEXT SESSION: Assess response to HEP. Continue to progress eccentric ankle strengthening, ROM/stretching, gross LE strengthening. Consider k tape for stability when pt is in her shoe as well as wearing her heel lift.    Ciearra Rufo April Ma L Vinay Ertl, PT, DPT 07/20/2023, 2:00 PM

## 2023-07-23 ENCOUNTER — Ambulatory Visit

## 2023-07-23 DIAGNOSIS — M7660 Achilles tendinitis, unspecified leg: Secondary | ICD-10-CM

## 2023-07-23 DIAGNOSIS — R29898 Other symptoms and signs involving the musculoskeletal system: Secondary | ICD-10-CM

## 2023-07-23 DIAGNOSIS — M25672 Stiffness of left ankle, not elsewhere classified: Secondary | ICD-10-CM

## 2023-07-23 DIAGNOSIS — R262 Difficulty in walking, not elsewhere classified: Secondary | ICD-10-CM

## 2023-07-23 DIAGNOSIS — M6281 Muscle weakness (generalized): Secondary | ICD-10-CM

## 2023-07-23 NOTE — Therapy (Signed)
 OUTPATIENT PHYSICAL THERAPY LOWER EXTREMITY TREATMENT   Patient Name: Terri Waters MRN: [id number] DOB:[birth year], 68 y.o., female Today's Date: 07/23/2023  END OF SESSION:  PT End of Session - 07/23/23 1444     Visit Number 5    Number of Visits 16    Date for PT Re-Evaluation 08/24/23    Authorization Type UHC    Progress Note Due on Visit 10    PT Start Time 1400    PT Stop Time 1457    PT Time Calculation (min) 57 min    Activity Tolerance Patient tolerated treatment well    Behavior During Therapy WFL for tasks assessed/performed              Past Medical History:  Diagnosis Date   Cancer (HCC)    papillary carcinoma   Diabetes mellitus without complication (HCC)    High cholesterol    Hypertension    Hypothyroidism    Migraines    Stroke (HCC)    Thyroid  disease    Ventricular tachycardia (HCC)    Past Surgical History:  Procedure Laterality Date   ABDOMINAL HYSTERECTOMY     BREAST SURGERY     lumps removed - over 20 years ago   THYROIDECTOMY     TOTAL KNEE ARTHROPLASTY Right 12/18/2017   Procedure: RIGHT TOTAL KNEE ARTHROPLASTY;  Surgeon: Kay Kemps, MD;  Location: [facility] OR;  Service: Orthopedics;  Laterality: Right;   Patient Active Problem List   Diagnosis Date Noted   Dizziness 09/24/2020   AMS (altered mental status) 09/24/2020   Cerebellar infarct (HCC) 06/23/2019   Status post total knee replacement, 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   Migraine with aura and without status migrainosus, not intractable 04/09/2017    PCP: [name], MD  REFERRING PROVIDER: Aniceto Eva Grebe, PA-C  REFERRING DIAG: [phone] (ICD-10-CM) - Achilles tendinitis, left leg  THERAPY DIAG:  Insertional Achilles tendinopathy  Stiffness of left ankle, not elsewhere classified  Difficulty in walking, not elsewhere classified  Muscle weakness (generalized)  Ankle weakness  Rationale for Evaluation and Treatment: Rehabilitation  ONSET DATE: 04/21/23 s/p L achilles tendon  reconstruction  SUBJECTIVE:   SUBJECTIVE STATEMENT: Pt states pain Bilat side of ankle  PERTINENT HISTORY: R TKA, cervical radiculopathy  PAIN:  Are you having pain? Yes: NPRS scale: 6 /10 Pain location: medial and lateral ankle Pain description: can be dull or sharp, zings at night, throbs Aggravating factors: Standing and walking a lot Relieving factors: Ice, elevate  PRECAUTIONS: None Per [facility] referral order: Eccentric strengthening, gentle stretching, gait training From 06/02/23 note: We will have the patient transition into regular shoes. This should be a gradual progression. The patient should use pain as guide when ambulating in regular shoes and if pain is experienced the patient should spend more time in the Cam boot.SABRASABRAI provided her a light duty work note seated work primarily beginning on June 2...  RED FLAGS: None   WEIGHT BEARING RESTRICTIONS: No  FALLS:  Has patient fallen in last 6 months? 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  DIAGNOSTIC FINDINGS: Nothing new in chart since surgery  PATIENT SURVEYS:  LEFS  Extreme difficulty/unable (0), Quite a bit of difficulty (1), Moderate difficulty (2), Little difficulty (3), No difficulty (4) Survey date:  06/29/23  Any of your usual work, housework or school activities 2  2. Usual hobbies, recreational or sporting activities 4  3. Getting into/out of the bath 4  4. Walking between rooms 4  5. Putting on socks/shoes 4  6. Squatting  3  7. Lifting an object, like a bag of groceries from the floor 3  8.  Performing light activities around your home 3  9. Performing heavy activities around your home 3  10. Getting into/out of a car 4  11. Walking 2 blocks 1  12. Walking 1 mile 1  13. Going up/down 10 stairs (1 flight) 3  14. Standing for 1 hour 1  15.  sitting for 1 hour 4  16. Running on even ground 1  17. Running on uneven ground 1  18. Making sharp turns while running fast 1  19. Hopping  4  20. Rolling over in bed 4  Score total:  55/80     COGNITION: Overall cognitive status: Within functional limits for tasks assessed     SENSATION: WFL  EDEMA:  L: 56 cm, R: 52 cm  MUSCLE LENGTH: See ankle DF for soleus length May need to measure gastroc length  POSTURE: No Significant postural limitations  PALPATION: TTP L posterior heel, medial gastroc/soleus, medial posterior heel distal to medial malleolus  LOWER EXTREMITY ROM:  Active ROM Right eval Left eval  Hip flexion    Hip extension    Hip abduction    Hip adduction    Hip internal rotation    Hip external rotation    Knee flexion    Knee extension    Ankle dorsiflexion 8 8  Ankle plantarflexion 45 22  Ankle inversion 25 20 pain on lateral malleolus  Ankle eversion 30 10   (Blank rows = not tested)  LOWER EXTREMITY MMT:  MMT Right eval Left eval  Hip flexion 5 5  Hip extension    Hip abduction    Hip adduction    Hip internal rotation    Hip external rotation    Knee flexion 5 4  Knee extension 5 4  Ankle dorsiflexion 5 At least 3+ limited due to tenderness/pain  Ankle plantarflexion 5 At least 3+ limited due to tenderness/pain  Ankle inversion    Ankle eversion     (Blank rows = not tested)  LOWER EXTREMITY SPECIAL TESTS:  Did not assess  FUNCTIONAL TESTS:  5 times sit to stand: 17.99 with UE support SLS: 3 sec on L, 22.87 sec on R  GAIT: Distance walked: Into clinic Assistive device utilized: Walker - 4 wheeled, CAM boot Level of assistance: Modified independence Comments: Mildly  antalgic, L LE slightly externally rotated during stance  TREATMENT DATE:  07/23/23 Nustep L4 x 6 min Standing gentle gastroc and solues stretch 2 x 30 STM to L gastro/soleus, achilles desensitization massage On airex w/o shoes:  Ant/post WS x 10  Lateral WS x 10  Step on and off airex laterally x 10 B Seated L ankle DF/EV GTB x 20 each Vaso R ankle low compression, x10 min, 34 deg temp, LE elevated  07/20/23 Nustep L4 x 6 min Standing gentle gastroc stretch x 30 Seated soleus stretch with strap x 30 Seated toe yoga 2x10 IASTM with massage roller gastroc/soleus and peroneals Seated ankle ev/df green TB 2x10 Seated ankle inv green TB 2x10 Seated eccentric plantar flexion knee straight and then knee bent green TB 2x10 Vaso R ankle medium compression, x10 min, 34 deg temp, LE elevated  07/08/23 Nustep L5 x 6 min Seated heel slide x20 Seated eccentric heel raise 2x10 Ankle IV, EV RTB 2x10 Seated eccentric PF knee straight red TB 2x10 Seated marble pick up 2x10 Seated toe yoga 2x10 Seated arch lift x10 Standing arch lift x10 Standing hip abd 2x10  07/01/23 Nustep L4x38min Ankle IV,EV,DF RTB  Eccentric PF with RTB knee bent and straight Towel scrunch Gastroc stretch w/ towel longsitting; soleus stretch same position Gait training- cues for toe off, antalgic gait Seated ball squeeze 10x5; with LAQ x 10 B  06/29/23  See HEP below   PATIENT EDUCATION:  Education details: Exam findings, POC, initial HEP, discussed RICE for edema Person educated: Patient Education method: Explanation, Demonstration, and Handouts Education comprehension: verbalized understanding, returned demonstration, and needs further education  HOME EXERCISE PROGRAM: Access Code: [email or web address] URL: [email or web address] Date: 06/29/2023 Prepared by: Gellen April  Earnie Starring  Exercises - CLX Ankle Dorsiflexion and Eversion  - 1 x daily - 7 x weekly - 2 sets - 10 reps - Seated Ankle Inversion with Resistance  - 1 x daily - 7 x weekly - 2 sets - 10 reps - Seated Eccentric Ankle Plantar Flexion with Resistance - Straight Leg  - 1 x daily - 7 x weekly - 3 sets - 10 reps - Seated Eccentric Ankle Plantar Flexion with Resistance  - 1 x daily - 7 x weekly - 3 sets - 10 reps - Towel Scrunches  - 1 x daily - 7 x weekly - 3 sets - 10 reps  ASSESSMENT:  CLINICAL IMPRESSION: Worked on gentle weight bearing and transferring to improve function. Still having pain along bilateral sides ankle. Tried desensitization for achilles insertion and STM for gastroc/soleus. Concluded session with vaso to address swelling and pain.   Eval: Patient is a 68 y.o. F who was seen today for physical therapy evaluation and treatment s/p L Achilles tendon reconstruction. Assessment is significant for decreased L foot/ankle ROM compared to R, pain, decreased gastroc/soleus strength, edema, and decreased balance affecting her ability to tolerate prolonged standing and walking for home and community tasks. Pt will benefit from PT to address these deficits to return to her normal function.   OBJECTIVE IMPAIRMENTS: Abnormal gait, decreased activity tolerance, decreased balance, decreased coordination, decreased endurance, decreased mobility, difficulty walking, decreased ROM, decreased strength, increased edema, increased fascial restrictions, increased muscle spasms, improper body mechanics, postural dysfunction, obesity, and pain.   ACTIVITY LIMITATIONS: standing, squatting, stairs, transfers, and locomotion level  PARTICIPATION LIMITATIONS: cleaning, laundry, shopping, community activity, and yard work  PERSONAL FACTORS: Age, Fitness, Past/current experiences, and Time since onset of injury/illness/exacerbation are also affecting patient's functional outcome.   REHAB POTENTIAL:  Good  CLINICAL DECISION MAKING: Evolving/moderate complexity  EVALUATION COMPLEXITY: Moderate   GOALS: Goals reviewed with patient? Yes  SHORT TERM GOALS: Target date: 07/27/2023  Pt will be ind with initial HEP Baseline: Goal status: MET- 07/23/23  2.  Pt will demo L = R ankle ROM Baseline:  Goal status: INITIAL  3.  Pt will report overall 50% improvement in her edema Baseline:  Goal status: INITIAL  LONG TERM GOALS: Target date: 08/24/2023   Pt will be ind with management and progression of HEP Baseline:  Goal status: INITIAL  2.  Pt will be able to tolerate L SLS for at least 10 sec to demo improving ankle strength/stability Baseline:  Goal status: INITIAL  3.  Pt will have improved 5x STS to </=13 sec to demo increased functional LE strength without UE use Baseline:  Goal status: INITIAL  4.  Pt will have improved LEFS to >/=64/80 to demo MCID Baseline:  Goal status: INITIAL  5.  Pt will be able to amb at least 1000' ind without any a/d for community amb Baseline:  Goal status: INITIAL  PLAN:  PT FREQUENCY: 2x/week  PT DURATION: 8 weeks  PLANNED INTERVENTIONS: 97164- PT Re-evaluation, 97750- Physical Performance Testing, 97110-Therapeutic exercises, 97530- Therapeutic activity, 97112- Neuromuscular re-education, 97535- Self Care, 02859- Manual therapy, [phone]- Gait training, [phone]- Aquatic Therapy, [phone]- Electrical stimulation (unattended), [id number]- Ultrasound, [id number]- Ionotophoresis 4mg /ml Dexamethasone, 79439 (1-2 muscles), 20561 (3+ muscles)- Dry Needling, Patient/Family education, Balance training, Stair training, Taping, Joint mobilization, Cryotherapy, and Moist heat  PLAN FOR NEXT SESSION: assess STGs; Continue to progress eccentric ankle strengthening, ROM/stretching, gross LE strengthening. Consider k tape for stability when pt is in her shoe as well as wearing her heel lift.    Nyjai Graff L Jailen Lung, PTA 07/23/2023, 3:36 PM

## 2023-07-27 ENCOUNTER — Ambulatory Visit: Admitting: Rehabilitation

## 2023-07-27 DIAGNOSIS — R29898 Other symptoms and signs involving the musculoskeletal system: Secondary | ICD-10-CM

## 2023-07-27 DIAGNOSIS — M6281 Muscle weakness (generalized): Secondary | ICD-10-CM

## 2023-07-27 DIAGNOSIS — R262 Difficulty in walking, not elsewhere classified: Secondary | ICD-10-CM

## 2023-07-27 DIAGNOSIS — M25672 Stiffness of left ankle, not elsewhere classified: Secondary | ICD-10-CM

## 2023-07-27 DIAGNOSIS — M7660 Achilles tendinitis, unspecified leg: Secondary | ICD-10-CM | POA: Diagnosis not present

## 2023-07-27 NOTE — Therapy (Signed)
 OUTPATIENT PHYSICAL THERAPY LOWER EXTREMITY TREATMENT   Patient Name: Terri Waters MRN: [id number] DOB:[birth year], 68 y.o., female Today's Date: 07/27/2023  END OF SESSION:  PT End of Session - 07/27/23 1442     Visit Number 6    Number of Visits 16    Date for PT Re-Evaluation 08/24/23    Authorization Type [employer]    Progress Note Due on Visit 10    PT Start Time 1450    PT Stop Time 1548    PT Time Calculation (min) 58 min    Activity Tolerance Patient tolerated treatment well;No increased pain    Behavior During Therapy WFL for tasks assessed/performed               Past Medical History:  Diagnosis Date   Cancer (HCC)    papillary carcinoma   Diabetes mellitus without complication (HCC)    High cholesterol    Hypertension    Hypothyroidism    Migraines    Stroke (HCC)    Thyroid  disease    Ventricular tachycardia (HCC)    Past Surgical History:  Procedure Laterality Date   ABDOMINAL HYSTERECTOMY     BREAST SURGERY     lumps removed - over 20 years ago   THYROIDECTOMY     TOTAL KNEE ARTHROPLASTY Right 12/18/2017   Procedure: RIGHT TOTAL KNEE ARTHROPLASTY;  Surgeon: Kay Kemps, MD;  Location: [facility] OR;  Service: Orthopedics;  Laterality: Right;   Patient Active Problem List   Diagnosis Date Noted   Dizziness 09/24/2020   AMS (altered mental status) 09/24/2020   Cerebellar infarct (HCC) 06/23/2019   Status post total knee replacement, 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 Has been ambulating some in her tennis shoes and some in her CAM boot depending on her pain level.  She denies any pain in the posterior LE or around the Achilles area.  All of her discomfort is medial and lateral malleolus point tenderness.  PERTINENT HISTORY: R TKA, cervical radiculopathy  PAIN:  Are you having pain? Yes: NPRS scale: 2-3 /10 Pain location: medial and lateral ankle Pain description: can be dull or sharp, zings at night, throbs Aggravating factors: Standing and walking a lot Relieving factors: Ice, elevate  PRECAUTIONS: None Per [facility] referral order: Eccentric strengthening, gentle stretching, gait training From 06/02/23 note: We will have the patient transition into regular shoes. This should be a gradual progression. The patient should use pain as guide when ambulating in regular shoes and if pain is experienced the patient should spend more time in the Cam boot.SABRASABRAI provided her a light duty work note seated work primarily beginning on June 2...  RED FLAGS: None   WEIGHT BEARING RESTRICTIONS: No  FALLS:  Has patient fallen in last 6 months? No  LIVING ENVIRONMENT: Lives with: lives with their family husband and grandson Lives in: House/apartment Stairs: 3 steps to enter, 13 steps to upstairs Has following equipment at home: Vannie - 4 wheeled and CAM boot  OCCUPATION: Full time --  coder for [employer]  PLOF: Independent  PATIENT GOALS: Return to normal walking  NEXT MD VISIT: 08/12/23  OBJECTIVE:  Note: Objective measures were completed at Evaluation unless otherwise noted.  DIAGNOSTIC FINDINGS: Nothing new in chart since surgery  PATIENT SURVEYS:  LEFS  Extreme difficulty/unable (0), Quite a bit of difficulty (1), Moderate difficulty (2), Little difficulty (3), No difficulty (4) Survey date:  06/29/23  Any of your usual work, housework or school activities 2  2. Usual  hobbies, recreational or sporting activities 4  3. Getting into/out of the bath 4  4. Walking between rooms 4  5. Putting on socks/shoes 4  6. Squatting  3  7. Lifting an object, like a bag of groceries from the floor 3  8. Performing light activities around your home 3  9. Performing heavy activities around your home 3  10. Getting into/out of a car 4  11. Walking 2 blocks 1  12. Walking 1 mile 1  13. Going up/down 10 stairs (1 flight) 3  14. Standing for 1 hour 1  15.  sitting for 1 hour 4  16. Running on even ground 1  17. Running on uneven ground 1  18. Making sharp turns while running fast 1  19. Hopping  4  20. Rolling over in bed 4  Score total:  55/80     COGNITION: Overall cognitive status: Within functional limits for tasks assessed     SENSATION: WFL  EDEMA:  L: 56 cm, R: 52 cm  MUSCLE LENGTH: See ankle DF for soleus length May need to measure gastroc length  POSTURE: No Significant postural limitations  PALPATION: TTP L posterior heel, medial gastroc/soleus, medial posterior heel distal to medial malleolus  LOWER EXTREMITY ROM:  Active ROM Right eval Left eval  Hip flexion    Hip extension    Hip abduction    Hip adduction    Hip internal rotation    Hip external rotation    Knee flexion    Knee extension    Ankle dorsiflexion 8 8  Ankle plantarflexion 45 22  Ankle inversion 25 20 pain on lateral malleolus  Ankle eversion 30 10   (Blank rows = not tested)  LOWER EXTREMITY MMT:  MMT Right eval Left eval  Hip flexion 5 5  Hip extension    Hip abduction    Hip adduction    Hip internal rotation    Hip external rotation    Knee flexion 5 4  Knee extension 5 4  Ankle dorsiflexion 5 At least 3+ limited due to tenderness/pain  Ankle plantarflexion 5 At least 3+ limited due to tenderness/pain  Ankle inversion    Ankle eversion     (Blank rows = not tested)  LOWER EXTREMITY SPECIAL TESTS:  Did not assess  FUNCTIONAL TESTS:  5 times  sit to stand: 17.99 with UE support SLS: 3 sec on L, 22.87 sec on R  GAIT: Distance walked: Into clinic Assistive device utilized: Walker - 4 wheeled, CAM boot Level of assistance: Modified independence Comments: Mildly antalgic, L LE slightly externally rotated during stance  TREATMENT DATE:  07/27/23 Nustep L4 x 6'  Wall Gastroc and soleus stretches x 1' x 2 ea LLE Seated Toes apart eversion x 30 Seated Heels apart inversion x 30  Airex mat S/S and F/B weight shifts x 20 ea Airex mat SLS with UE support x 1' Airex mat step ups x 10 F; x 20 lateral LLE  Seated GTB ankle PF w/ eccentric return x 20 LLE Seated RTB ankle inversion x 20 LLE Seated YTB ankle eversion x 20 LLE  KT taping for peroneal relief 1st strap; second strap figure 8 around heel and ankle medial to lateral  Vasopneumatic ice to L foot/ankle at medium compression 34 degree temp x 15' supine   07/23/23 Nustep L4 x 6 min Standing gentle gastroc and solues stretch 2 x 30 STM to L gastro/soleus, achilles desensitization massage On airex w/o shoes:  Ant/post WS x 10  Lateral WS x 10  Step on and off airex laterally x 10 B Seated L ankle DF/EV GTB x 20 each Vaso R ankle low compression, x10 min, 34 deg temp, LE elevated  07/20/23 Nustep L4 x 6 min Standing gentle gastroc stretch x 30 Seated soleus stretch with strap x 30 Seated toe yoga 2x10 IASTM with massage roller gastroc/soleus and peroneals Seated ankle ev/df green TB 2x10 Seated ankle inv green TB 2x10 Seated eccentric plantar flexion knee straight and then knee bent green TB 2x10 Vaso R ankle medium compression, x10 min, 34 deg temp, LE elevated  07/08/23 Nustep L5 x 6 min Seated heel slide x20 Seated eccentric heel raise 2x10 Ankle IV, EV RTB 2x10 Seated eccentric PF knee straight red TB 2x10 Seated marble pick up  2x10 Seated toe yoga 2x10 Seated arch lift x10 Standing arch lift x10 Standing hip abd 2x10  07/01/23 Nustep L4x20min Ankle IV,EV,DF RTB  Eccentric PF with RTB knee bent and straight Towel scrunch Gastroc stretch w/ towel longsitting; soleus stretch same position Gait training- cues for toe off, antalgic gait Seated ball squeeze 10x5; with LAQ x 10 B  06/29/23  See HEP below   PATIENT EDUCATION:  Education details: Exam findings, POC, initial HEP, discussed RICE for edema Person educated: Patient Education method: Explanation, Demonstration, and Handouts Education comprehension: verbalized understanding, returned demonstration, and needs further education  HOME EXERCISE PROGRAM: Access Code: [email or web address] URL: [email or web address] Date: 06/29/2023 Prepared by: Gellen April Earnie Starring  Exercises - CLX Ankle Dorsiflexion and Eversion  - 1 x daily - 7 x weekly - 2 sets - 10 reps - Seated Ankle Inversion with Resistance  - 1 x daily - 7 x weekly - 2 sets - 10 reps - Seated Eccentric Ankle Plantar Flexion with Resistance - Straight Leg  - 1 x daily - 7 x weekly - 3 sets - 10 reps - Seated Eccentric Ankle Plantar Flexion with Resistance  - 1 x daily - 7 x weekly - 3 sets - 10 reps - Towel Scrunches  - 1 x daily - 7 x weekly - 3 sets - 10 reps  ASSESSMENT:  CLINICAL IMPRESSION: Patient continues to have point tenderness with even light palpation over the medial and lateral malleolus.  She has evident late pronation BLE L >R with ambulation and may benefit from an arch support.  She is advised to try a soft OTC one to see if will help her pain.  She feels good relief in her CAM walker, but is weaning from this.  Eval: Patient is a 68 y.o. F who  was seen today for physical therapy evaluation and treatment s/p L Achilles tendon reconstruction. Assessment is significant for decreased L foot/ankle ROM compared to R, pain, decreased gastroc/soleus strength, edema, and  decreased balance affecting her ability to tolerate prolonged standing and walking for home and community tasks. Pt will benefit from PT to address these deficits to return to her normal function.   OBJECTIVE IMPAIRMENTS: Abnormal gait, decreased activity tolerance, decreased balance, decreased coordination, decreased endurance, decreased mobility, difficulty walking, decreased ROM, decreased strength, increased edema, increased fascial restrictions, increased muscle spasms, improper body mechanics, postural dysfunction, obesity, and pain.   ACTIVITY LIMITATIONS: standing, squatting, stairs, transfers, and locomotion level  PARTICIPATION LIMITATIONS: cleaning, laundry, shopping, community activity, and yard work  PERSONAL FACTORS: Age, Fitness, Past/current experiences, and Time since onset of injury/illness/exacerbation are also affecting patient's functional outcome.   REHAB POTENTIAL: Good  CLINICAL DECISION MAKING: Evolving/moderate complexity  EVALUATION COMPLEXITY: Moderate   GOALS: Goals reviewed with patient? Yes  SHORT TERM GOALS: Target date: 07/27/2023  Pt will be ind with initial HEP Baseline: Goal status: MET- 07/23/23  2.  Pt will demo L = R ankle ROM Baseline:  Goal status: INITIAL  3.  Pt will report overall 50% improvement in her edema Baseline:  Goal status: INITIAL  LONG TERM GOALS: Target date: 08/24/2023   Pt will be ind with management and progression of HEP Baseline:  Goal status: INITIAL  2.  Pt will be able to tolerate L SLS for at least 10 sec to demo improving ankle strength/stability Baseline:  Goal status: INITIAL  3.  Pt will have improved 5x STS to </=13 sec to demo increased functional LE strength without UE use Baseline:  Goal status: INITIAL  4.  Pt will have improved LEFS to >/=64/80 to demo MCID Baseline:  Goal status: INITIAL  5.  Pt will be able to amb at least 1000' ind without any a/d for community amb Baseline:  Goal status:  INITIAL  PLAN:  PT FREQUENCY: 2x/week  PT DURATION: 8 weeks  PLANNED INTERVENTIONS: 97164- PT Re-evaluation, 97750- Physical Performance Testing, 97110-Therapeutic exercises, 97530- Therapeutic activity, 97112- Neuromuscular re-education, 97535- Self Care, [id number]- Manual therapy, [phone]- Gait training, [phone]- Aquatic Therapy, [phone]- Electrical stimulation (unattended), [id number]- Ultrasound, [id number]- Ionotophoresis 4mg /ml Dexamethasone, 79439 (1-2 muscles), [id number] (3+ muscles)- Dry Needling, Patient/Family education, Balance training, Stair training, Taping, Joint mobilization, Cryotherapy, and Moist heat  PLAN FOR NEXT SESSION: Continue to work on ankle strengthening, try BAPS board, progress CKC gentle strengthening/proprioception;  assess if KT tape helped  Tashi Band, PT 07/27/2023, 4:03 PM

## 2023-07-29 ENCOUNTER — Ambulatory Visit

## 2023-08-04 ENCOUNTER — Ambulatory Visit: Admitting: Rehabilitation

## 2023-08-04 DIAGNOSIS — M6281 Muscle weakness (generalized): Secondary | ICD-10-CM

## 2023-08-04 DIAGNOSIS — M7660 Achilles tendinitis, unspecified leg: Secondary | ICD-10-CM

## 2023-08-04 DIAGNOSIS — M25672 Stiffness of left ankle, not elsewhere classified: Secondary | ICD-10-CM

## 2023-08-04 DIAGNOSIS — R262 Difficulty in walking, not elsewhere classified: Secondary | ICD-10-CM

## 2023-08-04 DIAGNOSIS — R29898 Other symptoms and signs involving the musculoskeletal system: Secondary | ICD-10-CM

## 2023-08-04 NOTE — Therapy (Signed)
 OUTPATIENT PHYSICAL THERAPY LOWER EXTREMITY TREATMENT   Patient Name: Terri Waters MRN: [id number] DOB:December 05, 1955, 68 y.o., female Today's Date: 08/04/2023  END OF SESSION:  PT End of Session - 08/04/23 1449     Visit Number 7    Number of Visits 16    Date for PT Re-Evaluation 08/24/23    Authorization Type [employer]    Progress Note Due on Visit 10    PT Start Time 1446    PT Stop Time 1535    PT Time Calculation (min) 49 min    Activity Tolerance Patient tolerated treatment well;No increased pain    Behavior During Therapy WFL for tasks assessed/performed                Past Medical History:  Diagnosis Date   Cancer (HCC)    papillary carcinoma   Diabetes mellitus without complication (HCC)    High cholesterol    Hypertension    Hypothyroidism    Migraines    Stroke (HCC)    Thyroid  disease    Ventricular tachycardia (HCC)    Past Surgical History:  Procedure Laterality Date   ABDOMINAL HYSTERECTOMY     BREAST SURGERY     lumps removed - over 20 years ago   THYROIDECTOMY     TOTAL KNEE ARTHROPLASTY Right 12/18/2017   Procedure: RIGHT TOTAL KNEE ARTHROPLASTY;  Surgeon: Kay Kemps, MD;  Location: [facility] OR;  Service: Orthopedics;  Laterality: Right;   Patient Active Problem List   Diagnosis Date Noted   Dizziness 09/24/2020   AMS (altered mental status) 09/24/2020   Cerebellar infarct (HCC) 06/23/2019   Status post total knee replacement, 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   Migraine with aura and without status migrainosus, not intractable 04/09/2017    PCP: [name], MD  REFERRING PROVIDER: Aniceto Eva Grebe, PA-C  REFERRING DIAG: [phone] (ICD-10-CM) - Achilles tendinitis, left leg  THERAPY DIAG:  Insertional Achilles tendinopathy  Stiffness of left ankle, not elsewhere classified  Difficulty in walking, not elsewhere classified  Muscle weakness (generalized)  Ankle weakness  Rationale for Evaluation and Treatment: Rehabilitation  ONSET DATE: 04/21/23  s/p L achilles tendon reconstruction  SUBJECTIVE:   SUBJECTIVE STATEMENT: Patient is 3.5 months post op and doing well per her report.  States tape really helped last time and painfree today  Pt. Has been ambulating some in her tennis shoes and some in her CAM boot depending on her pain level.  She denies any pain in the posterior LE or around the Achilles area.  All of her discomfort is medial and lateral malleolus point tenderness.  PERTINENT HISTORY: R TKA, cervical radiculopathy  PAIN:  Are you having pain? Yes: NPRS scale: 2-3 /10 Pain location: medial and lateral ankle Pain description: can be dull or sharp, zings at night, throbs Aggravating factors: Standing and walking a lot Relieving factors: Ice, elevate  PRECAUTIONS: None Per [facility] referral order: Eccentric strengthening, gentle stretching, gait training From 06/02/23 note: We will have the patient transition into regular shoes. This should be a gradual progression. The patient should use pain as guide when ambulating in regular shoes and if pain is experienced the patient should spend more time in the Cam boot.SABRASABRAI provided her a light duty work note seated work primarily beginning on June 2...  RED FLAGS: None   WEIGHT BEARING RESTRICTIONS: No  FALLS:  Has patient fallen in last 6 months? No  LIVING ENVIRONMENT: Lives with: lives with their family husband and grandson Lives in: House/apartment Stairs:  3 steps to enter, 13 steps to upstairs Has following equipment at home: Vannie - 4 wheeled and CAM boot  OCCUPATION: Full time -- coder for [employer]  PLOF: Independent  PATIENT GOALS: Return to normal walking  NEXT MD VISIT: 08/12/23  OBJECTIVE:  Note: Objective measures were completed at Evaluation unless otherwise noted.  DIAGNOSTIC FINDINGS: Nothing new in chart since surgery  PATIENT SURVEYS:  LEFS  Extreme difficulty/unable (0), Quite a bit of difficulty (1), Moderate difficulty (2), Little  difficulty (3), No difficulty (4) Survey date:  06/29/23  Any of your usual work, housework or school activities 2  2. Usual hobbies, recreational or sporting activities 4  3. Getting into/out of the bath 4  4. Walking between rooms 4  5. Putting on socks/shoes 4  6. Squatting  3  7. Lifting an object, like a bag of groceries from the floor 3  8. Performing light activities around your home 3  9. Performing heavy activities around your home 3  10. Getting into/out of a car 4  11. Walking 2 blocks 1  12. Walking 1 mile 1  13. Going up/down 10 stairs (1 flight) 3  14. Standing for 1 hour 1  15.  sitting for 1 hour 4  16. Running on even ground 1  17. Running on uneven ground 1  18. Making sharp turns while running fast 1  19. Hopping  4  20. Rolling over in bed 4  Score total:  55/80     COGNITION: Overall cognitive status: Within functional limits for tasks assessed     SENSATION: WFL  EDEMA:  L: 56 cm, R: 52 cm  MUSCLE LENGTH: See ankle DF for soleus length May need to measure gastroc length  POSTURE: No Significant postural limitations  PALPATION: TTP L posterior heel, medial gastroc/soleus, medial posterior heel distal to medial malleolus  LOWER EXTREMITY ROM:  Active ROM Right eval Left eval  Hip flexion    Hip extension    Hip abduction    Hip adduction    Hip internal rotation    Hip external rotation    Knee flexion    Knee extension    Ankle dorsiflexion 8 8  Ankle plantarflexion 45 22  Ankle inversion 25 20 pain on lateral malleolus  Ankle eversion 30 10   (Blank rows = not tested)  LOWER EXTREMITY MMT:  MMT Right eval Left eval  Hip flexion 5 5  Hip extension    Hip abduction    Hip adduction    Hip internal rotation    Hip external rotation    Knee flexion 5 4  Knee extension 5 4  Ankle dorsiflexion 5 At least 3+ limited due to tenderness/pain  Ankle plantarflexion 5 At least 3+ limited due to tenderness/pain  Ankle inversion     Ankle eversion     (Blank rows = not tested)  LOWER EXTREMITY SPECIAL TESTS:  Did not assess  FUNCTIONAL TESTS:  5 times sit to stand: 17.99 with UE support SLS: 3 sec on L, 22.87 sec on R  GAIT: Distance walked: Into clinic Assistive device utilized: Walker - 4 wheeled, CAM boot Level of assistance: Modified independence Comments: Mildly antalgic, L LE slightly externally rotated during stance  TREATMENT DATE:  08/04/23 THERAPEUTIC EXERCISE: To improve ROM.  Demonstration, verbal and tactile cues throughout for technique. Bike L4 x 6'  NEUROMUSCULAR RE-EDUCATION: To improve balance and proprioception. Step ups on aerobic and theraband blue foam F x 10; lateral x 10 LLE SLS on blue foam x 1' LLE Balance on rocker board x 1' F/B and 1' S/S Rocking on rocker board x 20 F/B and x 20 S/S  THERAPEUTIC ACTIVITIES: To improve functional performance.  Demonstration, verbal and tactile cues throughout for technique. Aerobic step toe prop calf stretch knee straight x 1' x 2 LLE; knee bent x 1' x 2Seated ankle inversion RTB x 20 LLE Seated ankle eversion RTB x 20 LLE Seated ankle PF w/ eccentric 5 sec lowering GTB x 20 LLE Seated ankle DF RTB x 20 LLE Standing heel/toe rocking x 20 BLE  MANUAL THERAPY: To promote reduced pain utilizing kinesiotaping. KT to L peroneal 2 piece, and ankle support figure 8 1 piece  07/27/23 Nustep L4 x 6'  Wall Gastroc and soleus stretches x 1' x 2 ea LLE Seated Toes apart eversion x 30 Seated Heels apart inversion x 30  Airex mat S/S and F/B weight shifts x 20 ea Airex mat SLS with UE support x 1' Airex mat step ups x 10 F; x 20 lateral LLE  Seated GTB ankle PF w/ eccentric return x 20 LLE Seated RTB ankle inversion x 20 LLE Seated YTB ankle eversion x 20 LLE  KT taping for peroneal relief 1st strap; second strap figure  8 around heel and ankle medial to lateral  Vasopneumatic ice to L foot/ankle at medium compression 34 degree temp x 15' supine   07/23/23 Nustep L4 x 6 min Standing gentle gastroc and solues stretch 2 x 30 STM to L gastro/soleus, achilles desensitization massage On airex w/o shoes:  Ant/post WS x 10  Lateral WS x 10  Step on and off airex laterally x 10 B Seated L ankle DF/EV GTB x 20 each Vaso R ankle low compression, x10 min, 34 deg temp, LE elevated  07/20/23 Nustep L4 x 6 min Standing gentle gastroc stretch x 30 Seated soleus stretch with strap x 30 Seated toe yoga 2x10 IASTM with massage roller gastroc/soleus and peroneals Seated ankle ev/df green TB 2x10 Seated ankle inv green TB 2x10 Seated eccentric plantar flexion knee straight and then knee bent green TB 2x10 Vaso R ankle medium compression, x10 min, 34 deg temp, LE elevated  07/08/23 Nustep L5 x 6 min Seated heel slide x20 Seated eccentric heel raise 2x10 Ankle IV, EV RTB 2x10 Seated eccentric PF knee straight red TB 2x10 Seated marble pick up 2x10 Seated toe yoga 2x10 Seated arch lift x10 Standing arch lift x10 Standing hip abd 2x10  07/01/23 Nustep L4x53min Ankle IV,EV,DF RTB  Eccentric PF with RTB knee bent and straight Towel scrunch Gastroc stretch w/ towel longsitting; soleus stretch same position Gait training- cues for toe off, antalgic gait Seated ball squeeze 10x5; with LAQ x 10 B  06/29/23  See HEP below   PATIENT EDUCATION:  Education details: Exam findings, POC, initial HEP, discussed RICE for edema Person educated: Patient Education method: Explanation, Demonstration, and Handouts Education comprehension: verbalized understanding, returned demonstration, and needs further education  HOME EXERCISE PROGRAM: Access Code: [email or web address] URL: [email or web address] Date: 06/29/2023 Prepared by: Gellen April Earnie Starring  Exercises - CLX Ankle Dorsiflexion and Eversion  - 1 x  daily - 7 x weekly - 2 sets - 10 reps -  Seated Ankle Inversion with Resistance  - 1 x daily - 7 x weekly - 2 sets - 10 reps - Seated Eccentric Ankle Plantar Flexion with Resistance - Straight Leg  - 1 x daily - 7 x weekly - 3 sets - 10 reps - Seated Eccentric Ankle Plantar Flexion with Resistance  - 1 x daily - 7 x weekly - 3 sets - 10 reps - Towel Scrunches  - 1 x daily - 7 x weekly - 3 sets - 10 reps  ASSESSMENT:  CLINICAL IMPRESSION: 7/22: Patient doing much better with pain and only has mild lateral malleolar /peroneal point tenderness today.   Getting good relief with taping.  Progressing to higher level single leg balance activities.  Gently stretching into dorsiflexion more each visit.  Eval: Patient is a 68 y.o. F who was seen today for physical therapy evaluation and treatment s/p L Achilles tendon reconstruction. Assessment is significant for decreased L foot/ankle ROM compared to R, pain, decreased gastroc/soleus strength, edema, and decreased balance affecting her ability to tolerate prolonged standing and walking for home and community tasks. Pt will benefit from PT to address these deficits to return to her normal function.   OBJECTIVE IMPAIRMENTS: Abnormal gait, decreased activity tolerance, decreased balance, decreased coordination, decreased endurance, decreased mobility, difficulty walking, decreased ROM, decreased strength, increased edema, increased fascial restrictions, increased muscle spasms, improper body mechanics, postural dysfunction, obesity, and pain.   ACTIVITY LIMITATIONS: standing, squatting, stairs, transfers, and locomotion level  PARTICIPATION LIMITATIONS: cleaning, laundry, shopping, community activity, and yard work  PERSONAL FACTORS: Age, Fitness, Past/current experiences, and Time since onset of injury/illness/exacerbation are also affecting patient's functional outcome.   REHAB POTENTIAL: Good  CLINICAL DECISION MAKING: Evolving/moderate  complexity  EVALUATION COMPLEXITY: Moderate   GOALS: Goals reviewed with patient? Yes  SHORT TERM GOALS: Target date: 07/27/2023  Pt will be ind with initial HEP Baseline: Goal status: MET- 07/23/23  2.  Pt will demo L = R ankle ROM Baseline:  Goal status: INITIAL  3.  Pt will report overall 50% improvement in her edema Baseline:  Goal status: INITIAL  LONG TERM GOALS: Target date: 08/24/2023   Pt will be ind with management and progression of HEP Baseline:  Goal status: INITIAL  2.  Pt will be able to tolerate L SLS for at least 10 sec to demo improving ankle strength/stability Baseline:  Goal status: INITIAL  3.  Pt will have improved 5x STS to </=13 sec to demo increased functional LE strength without UE use Baseline:  Goal status: INITIAL  4.  Pt will have improved LEFS to >/=64/80 to demo MCID Baseline:  Goal status: INITIAL  5.  Pt will be able to amb at least 1000' ind without any a/d for community amb Baseline:  Goal status: INITIAL  PLAN:  PT FREQUENCY: 2x/week  PT DURATION: 8 weeks  PLANNED INTERVENTIONS: [id number]- PT Re-evaluation, 97750- Physical Performance Testing, 97110-Therapeutic exercises, 97530- Therapeutic activity, 97112- Neuromuscular re-education, 97535- Self Care, [id number]- Manual therapy, [id number]- Gait training, [phone]- Aquatic Therapy, [phone]- Electrical stimulation (unattended), [id number]- Ultrasound, [id number]- Ionotophoresis 4mg /ml Dexamethasone, [id number] (1-2 muscles), [id number] (3+ muscles)- Dry Needling, Patient/Family education, Balance training, Stair training, Taping, Joint mobilization, Cryotherapy, and Moist heat  PLAN FOR NEXT SESSION: SLS and balance activities.  BAPS board strengthening, continue taping, vaso if needed for pain/edema  Davan Hark, PT 08/04/2023, 5:15 PM

## 2023-08-10 ENCOUNTER — Ambulatory Visit: Admitting: Rehabilitation

## 2023-08-10 DIAGNOSIS — R29898 Other symptoms and signs involving the musculoskeletal system: Secondary | ICD-10-CM

## 2023-08-10 DIAGNOSIS — M7660 Achilles tendinitis, unspecified leg: Secondary | ICD-10-CM | POA: Diagnosis not present

## 2023-08-10 DIAGNOSIS — M25672 Stiffness of left ankle, not elsewhere classified: Secondary | ICD-10-CM

## 2023-08-10 DIAGNOSIS — M6281 Muscle weakness (generalized): Secondary | ICD-10-CM

## 2023-08-10 DIAGNOSIS — R262 Difficulty in walking, not elsewhere classified: Secondary | ICD-10-CM

## 2023-08-10 NOTE — Therapy (Unsigned)
 OUTPATIENT PHYSICAL THERAPY LOWER EXTREMITY TREATMENT   Patient Name: Terri Waters MRN: [id number] DOB:[birth year], 68 y.o., female Today's Date: 08/12/2023  END OF SESSION:          Past Medical History:  Diagnosis Date   Cancer (HCC)    papillary carcinoma   Diabetes mellitus without complication (HCC)    High cholesterol    Hypertension    Hypothyroidism    Migraines    Stroke [facility])    Thyroid  disease    Ventricular tachycardia (HCC)    Past Surgical History:  Procedure Laterality Date   ABDOMINAL HYSTERECTOMY     BREAST SURGERY     lumps removed - over 20 years ago   THYROIDECTOMY     TOTAL KNEE ARTHROPLASTY Right 12/18/2017   Procedure: RIGHT TOTAL KNEE ARTHROPLASTY;  Surgeon: Kay Kemps, MD;  Location: [facility] OR;  Service: Orthopedics;  Laterality: Right;   Patient Active Problem List   Diagnosis Date Noted   Dizziness 09/24/2020   AMS (altered mental status) 09/24/2020   Cerebellar infarct (HCC) 06/23/2019   Status post total knee replacement, 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   Migraine with aura and without status migrainosus, not intractable 04/09/2017    PCP: [name], MD  REFERRING PROVIDER: Aniceto Eva Grebe, PA-C  REFERRING DIAG: [id number] (ICD-10-CM) - Achilles tendinitis, left leg  THERAPY DIAG:  Insertional Achilles tendinopathy  Stiffness of left ankle, not elsewhere classified  Difficulty in walking, not elsewhere classified  Muscle weakness (generalized)  Ankle weakness  Rationale for Evaluation and Treatment: Rehabilitation  ONSET DATE: 04/21/23 s/p L achilles tendon reconstruction  SUBJECTIVE:   SUBJECTIVE STATEMENT: Patient states a little sore today.  Pain is now just lateral to the Achilles tendon with some edema around the lateral malleolus and just posteriorly.   She has exquisite point tenderness of this area, but nowhere else.   States went upstairs and did a lot of cleaning this weekend.  States went to [employer] as well.       PERTINENT HISTORY: R TKA, cervical radiculopathy  PAIN:  Are you having pain? Yes: NPRS scale: 2-3 /10 Pain location: medial and lateral ankle Pain description: can be dull or sharp, zings at night, throbs Aggravating factors: Standing and walking a lot Relieving factors: Ice, elevate  PRECAUTIONS: None Per [facility] referral order: Eccentric strengthening, gentle stretching, gait training From 06/02/23 note: We will have the patient transition into regular shoes. This should be a gradual progression. The patient should use pain as guide when ambulating in regular shoes and if pain is experienced the patient should spend more time in the Cam boot.SABRASABRAI provided her a light duty work note seated work primarily beginning on June 2...  RED FLAGS: None   WEIGHT BEARING RESTRICTIONS: No  FALLS:  Has patient fallen in last 6 months? 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  DIAGNOSTIC FINDINGS: Nothing new in chart since surgery  PATIENT SURVEYS:  LEFS  Extreme difficulty/unable (0), Quite a bit of difficulty (1), Moderate difficulty (2), Little difficulty (3), No difficulty (4) Survey date:  06/29/23  Any of your usual work, housework  or school activities 2  2. Usual hobbies, recreational or sporting activities 4  3. Getting into/out of the bath 4  4. Walking between rooms 4  5. Putting on socks/shoes 4  6. Squatting  3  7. Lifting an object, like a bag of groceries from the floor 3  8. Performing light activities around your home 3  9. Performing heavy activities around your  home 3  10. Getting into/out of a car 4  11. Walking 2 blocks 1  12. Walking 1 mile 1  13. Going up/down 10 stairs (1 flight) 3  14. Standing for 1 hour 1  15.  sitting for 1 hour 4  16. Running on even ground 1  17. Running on uneven ground 1  18. Making sharp turns while running fast 1  19. Hopping  4  20. Rolling over in bed 4  Score total:  55/80     COGNITION: Overall cognitive status: Within functional limits for tasks assessed     SENSATION: WFL  EDEMA:  L: 56 cm, R: 52 cm L = 33.5 cm on 08/10/23  MUSCLE LENGTH: See ankle DF for soleus length May need to measure gastroc length  POSTURE: No Significant postural limitations  PALPATION: TTP L posterior heel, medial gastroc/soleus, medial posterior heel distal to medial malleolus  LOWER EXTREMITY ROM:  Active ROM Right eval Left eval  Hip flexion    Hip extension    Hip abduction    Hip adduction    Hip internal rotation    Hip external rotation    Knee flexion    Knee extension    Ankle dorsiflexion 8 8  Ankle plantarflexion 45 22  Ankle inversion 25 20 pain on lateral malleolus  Ankle eversion 30 10   (Blank rows = not tested)  LOWER EXTREMITY MMT:  MMT Right eval Left eval  Hip flexion 5 5  Hip extension    Hip abduction    Hip adduction    Hip internal rotation    Hip external rotation    Knee flexion 5 4  Knee extension 5 4  Ankle dorsiflexion 5 At least 3+ limited due to tenderness/pain  Ankle plantarflexion 5 At least 3+ limited due to tenderness/pain  Ankle inversion    Ankle eversion     (Blank rows = not tested)  LOWER EXTREMITY SPECIAL TESTS:  Did not assess  FUNCTIONAL TESTS:  5 times sit to stand: 17.99 with UE support SLS: 3 sec on L, 22.87 sec on R  GAIT: Distance walked: Into clinic Assistive device utilized: Walker - 4 wheeled, CAM boot Level of assistance: Modified independence Comments: Mildly antalgic, L LE slightly externally rotated during stance                                                                                                                                 TREATMENT DATE:  08/10/23 THERAPEUTIC EXERCISE: To improve ROM.  Demonstration, verbal and tactile cues throughout for technique. Bike L4 x 6'  THERAPEUTIC ACTIVITIES: To improve functional performance.  Demonstration, verbal and tactile cues throughout for technique. BAPS CW x 20;  CCW x 20 LLE  NEUROMUSCULAR RE-EDUCATION: To improve balance. Standing rocker board balance F/B x 1';  S/S x 1' Standing rocker board rocking F/B x 20;  S/S x 1' Balance disc step ups 2/10 LLE Balance disc SLS w/ UE support x 1'  08/04/23 THERAPEUTIC EXERCISE: To improve ROM.  Demonstration, verbal and tactile cues throughout for technique. Bike L4 x 6'  NEUROMUSCULAR RE-EDUCATION: To improve balance and proprioception. Step ups on aerobic and theraband blue foam F x 10; lateral x 10 LLE SLS on blue foam x 1' LLE Balance on rocker board x 1' F/B and 1' S/S Rocking on rocker board x 20 F/B and x 20 S/S  THERAPEUTIC ACTIVITIES: To improve functional performance.  Demonstration, verbal and tactile cues throughout for technique. Aerobic step toe prop calf stretch knee straight x 1' x 2 LLE; knee bent x 1' x 2Seated ankle inversion RTB x 20 LLE Seated ankle eversion RTB x 20 LLE Seated ankle PF w/ eccentric 5 sec lowering GTB x 20 LLE Seated ankle DF RTB x 20 LLE Standing heel/toe rocking x 20 BLE  MANUAL THERAPY: To promote reduced pain utilizing kinesiotaping. KT to L peroneal 2 piece, and ankle support figure 8 1 piece  07/27/23 Nustep L4 x 6'  Wall Gastroc and soleus stretches x 1' x 2 ea LLE Seated Toes apart eversion x 30 Seated Heels apart inversion x 30  Airex mat S/S and F/B weight shifts x 20 ea Airex mat SLS with UE support x 1' Airex mat step ups x 10 F; x 20 lateral LLE  Seated GTB ankle PF w/ eccentric return x 20 LLE Seated RTB ankle inversion x 20 LLE Seated YTB ankle  eversion x 20 LLE  KT taping for peroneal relief 1st strap; second strap figure 8 around heel and ankle medial to lateral  Vasopneumatic ice to L foot/ankle at medium compression 34 degree temp x 15' supine   07/23/23 Nustep L4 x 6 min Standing gentle gastroc and solues stretch 2 x 30 STM to L gastro/soleus, achilles desensitization massage On airex w/o shoes:  Ant/post WS x 10  Lateral WS x 10  Step on and off airex laterally x 10 B Seated L ankle DF/EV GTB x 20 each Vaso R ankle low compression, x10 min, 34 deg temp, LE elevated  07/20/23 Nustep L4 x 6 min Standing gentle gastroc stretch x 30 Seated soleus stretch with strap x 30 Seated toe yoga 2x10 IASTM with massage roller gastroc/soleus and peroneals Seated ankle ev/df green TB 2x10 Seated ankle inv green TB 2x10 Seated eccentric plantar flexion knee straight and then knee bent green TB 2x10 Vaso R ankle medium compression, x10 min, 34 deg temp, LE elevated  07/08/23 Nustep L5 x 6 min Seated heel slide x20 Seated eccentric heel raise 2x10 Ankle IV, EV RTB 2x10 Seated eccentric PF knee straight red TB 2x10 Seated marble pick up 2x10 Seated toe yoga 2x10 Seated arch lift x10 Standing arch lift x10 Standing hip abd 2x10  07/01/23 Nustep L4x16min Ankle IV,EV,DF RTB  Eccentric PF with RTB knee bent and straight Towel scrunch Gastroc stretch w/ towel longsitting; soleus stretch same position Gait training- cues for toe off, antalgic gait Seated ball squeeze 10x5; with LAQ x 10 B  06/29/23  See HEP  below   PATIENT EDUCATION:  Education details: Exam findings, POC, initial HEP, discussed RICE for edema Person educated: Patient Education method: Explanation, Demonstration, and Handouts Education comprehension: verbalized understanding, returned demonstration, and needs further education  HOME EXERCISE PROGRAM: Access Code: [email or web address] URL: [email or web address] Date: 06/29/2023 Prepared by: Gellen  April Earnie Starring  Exercises - CLX Ankle Dorsiflexion and Eversion  - 1 x daily - 7 x weekly - 2 sets - 10 reps - Seated Ankle Inversion with Resistance  - 1 x daily - 7 x weekly - 2 sets - 10 reps - Seated Eccentric Ankle Plantar Flexion with Resistance - Straight Leg  - 1 x daily - 7 x weekly - 3 sets - 10 reps - Seated Eccentric Ankle Plantar Flexion with Resistance  - 1 x daily - 7 x weekly - 3 sets - 10 reps - Towel Scrunches  - 1 x daily - 7 x weekly - 3 sets - 10 reps  ASSESSMENT:  CLINICAL IMPRESSION: 08/10/23:  Patient is progressing well with Achilles stretching and L ankle strengthening.   Slowly adding more challenging balance activities.  Needs to progress more into dynamic balance.  She cannot do a single leg Calf raise on the L yet.  She will f/u with Dr Kit later this week.  Eval: Patient is a 68 y.o. F who was seen today for physical therapy evaluation and treatment s/p L Achilles tendon reconstruction. Assessment is significant for decreased L foot/ankle ROM compared to R, pain, decreased gastroc/soleus strength, edema, and decreased balance affecting her ability to tolerate prolonged standing and walking for home and community tasks. Pt will benefit from PT to address these deficits to return to her normal function.   OBJECTIVE IMPAIRMENTS: Abnormal gait, decreased activity tolerance, decreased balance, decreased coordination, decreased endurance, decreased mobility, difficulty walking, decreased ROM, decreased strength, increased edema, increased fascial restrictions, increased muscle spasms, improper body mechanics, postural dysfunction, obesity, and pain.   ACTIVITY LIMITATIONS: standing, squatting, stairs, transfers, and locomotion level  PARTICIPATION LIMITATIONS: cleaning, laundry, shopping, community activity, and yard work  PERSONAL FACTORS: Age, Fitness, Past/current experiences, and Time since onset of injury/illness/exacerbation are also affecting patient's  functional outcome.   REHAB POTENTIAL: Good  CLINICAL DECISION MAKING: Evolving/moderate complexity  EVALUATION COMPLEXITY: Moderate   GOALS: Goals reviewed with patient? Yes  SHORT TERM GOALS: Target date: 07/27/2023  Pt will be ind with initial HEP Baseline: Goal status: MET- 07/23/23  2.  Pt will demo L = R ankle ROM Baseline:  Goal status: INITIAL  3.  Pt will report overall 50% improvement in her edema Baseline:  Goal status: INITIAL  LONG TERM GOALS: Target date: 08/24/2023   Pt will be ind with management and progression of HEP Baseline:  Goal status: INITIAL  2.  Pt will be able to tolerate L SLS for at least 10 sec to demo improving ankle strength/stability Baseline:  Goal status: INITIAL  3.  Pt will have improved 5x STS to </=13 sec to demo increased functional LE strength without UE use Baseline:  Goal status: INITIAL  4.  Pt will have improved LEFS to >/=64/80 to demo MCID Baseline:  Goal status: INITIAL  5.  Pt will be able to amb at least 1000' ind without any a/d for community amb Baseline:  Goal status: INITIAL  PLAN:  PT FREQUENCY: 2x/week  PT DURATION: 8 weeks  PLANNED INTERVENTIONS: 97164- PT Re-evaluation, 97750- Physical Performance Testing, 97110-Therapeutic exercises, 97530- Therapeutic activity, [id number]- Neuromuscular re-education, 97535- Self Care,  02859- Manual therapy, [id number]- Gait training, [id number]- Aquatic Therapy, [phone]- Electrical stimulation (unattended), [id number]- Ultrasound, [id number]- Ionotophoresis 4mg /ml Dexamethasone, 20560 (1-2 muscles), 20561 (3+ muscles)- Dry Needling, Patient/Family education, Balance training, Stair training, Taping, Joint mobilization, Cryotherapy, and Moist heat  PLAN FOR NEXT SESSION: Continue Achilles stretching gently;  Progress to dynamic balance and more challenging SLS as she tolerates;  Progress w/ calf raises.    Yeraldy Spike, PT 08/12/2023, 9:40 AM

## 2023-08-12 ENCOUNTER — Ambulatory Visit

## 2023-08-18 ENCOUNTER — Encounter: Payer: Self-pay | Admitting: Rehabilitation

## 2023-08-18 ENCOUNTER — Ambulatory Visit: Attending: Student | Admitting: Rehabilitation

## 2023-08-18 DIAGNOSIS — R29898 Other symptoms and signs involving the musculoskeletal system: Secondary | ICD-10-CM | POA: Insufficient documentation

## 2023-08-18 DIAGNOSIS — R262 Difficulty in walking, not elsewhere classified: Secondary | ICD-10-CM | POA: Insufficient documentation

## 2023-08-18 DIAGNOSIS — M7660 Achilles tendinitis, unspecified leg: Secondary | ICD-10-CM | POA: Insufficient documentation

## 2023-08-18 DIAGNOSIS — M6281 Muscle weakness (generalized): Secondary | ICD-10-CM | POA: Insufficient documentation

## 2023-08-18 DIAGNOSIS — M25672 Stiffness of left ankle, not elsewhere classified: Secondary | ICD-10-CM | POA: Diagnosis present

## 2023-08-18 NOTE — Therapy (Addendum)
 OUTPATIENT PHYSICAL THERAPY LOWER EXTREMITY TREATMENT   Patient Name: Terri Waters MRN: [id number] DOB:1955/06/03, 68 y.o., female Today's Date: 08/18/2023  END OF SESSION:  PT End of Session - 08/18/23 1441     Visit Number 9    Number of Visits 16    Date for PT Re-Evaluation 08/24/23    Authorization Type [employer]    Progress Note Due on Visit 10    PT Start Time 1436    PT Stop Time 1532    PT Time Calculation (min) 56 min    Activity Tolerance Patient tolerated treatment well;No increased pain    Behavior During Therapy WFL for tasks assessed/performed                 Past Medical History:  Diagnosis Date   Cancer (HCC)    papillary carcinoma   Diabetes mellitus without complication (HCC)    High cholesterol    Hypertension    Hypothyroidism    Migraines    Stroke (HCC)    Thyroid  disease    Ventricular tachycardia (HCC)    Past Surgical History:  Procedure Laterality Date   ABDOMINAL HYSTERECTOMY     BREAST SURGERY     lumps removed - over 20 years ago   THYROIDECTOMY     TOTAL KNEE ARTHROPLASTY Right 12/18/2017   Procedure: RIGHT TOTAL KNEE ARTHROPLASTY;  Surgeon: Kay Kemps, MD;  Location: [facility] OR;  Service: Orthopedics;  Laterality: Right;   Patient Active Problem List   Diagnosis Date Noted   Dizziness 09/24/2020   AMS (altered mental status) 09/24/2020   Cerebellar infarct (HCC) 06/23/2019   Status post total knee replacement, 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   Migraine with aura and without status migrainosus, not intractable 04/09/2017    PCP: [name], MD  REFERRING PROVIDER: Aniceto Eva Grebe, PA-C  REFERRING DIAG: [phone] (ICD-10-CM) - Achilles tendinitis, left leg  THERAPY DIAG:  Insertional Achilles tendinopathy  Stiffness of left ankle, not elsewhere classified  Difficulty in walking, not elsewhere classified  Muscle weakness (generalized)  Ankle weakness  Rationale for Evaluation and Treatment: Rehabilitation  ONSET DATE: 04/21/23  s/p L achilles tendon reconstruction  SUBJECTIVE:   SUBJECTIVE STATEMENT: 8/5:  States sore today from trying to stand on her tiptoes at home for housework.  Rates pain 4/10 today; denies any falls   PERTINENT HISTORY: R TKA, cervical radiculopathy  PAIN:  Are you having pain? Yes: NPRS scale: 2-3 /10 Pain location: medial and lateral ankle Pain description: can be dull or sharp, zings at night, throbs Aggravating factors: Standing and walking a lot Relieving factors: Ice, elevate  PRECAUTIONS: None Per [facility] referral order: Eccentric strengthening, gentle stretching, gait training From 06/02/23 note: We will have the patient transition into regular shoes. This should be a gradual progression. The patient should use pain as guide when ambulating in regular shoes and if pain is experienced the patient should spend more time in the Cam boot.SABRASABRAI provided her a light duty work note seated work primarily beginning on June 2...  RED FLAGS: None   WEIGHT BEARING RESTRICTIONS: No  FALLS:  Has patient fallen in last 6 months? 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  DIAGNOSTIC FINDINGS: Nothing new in chart since surgery  PATIENT SURVEYS:  LEFS  Extreme difficulty/unable (0), Quite a bit of difficulty (1), Moderate difficulty (2), Little difficulty (3), No difficulty (4) Survey date:  06/29/23  Any of your usual work, housework or school activities 2  2. Usual hobbies, recreational or sporting activities 4  3. Getting into/out of the bath 4  4. Walking between rooms 4  5.  Putting on socks/shoes 4  6. Squatting  3  7. Lifting an object, like a bag of groceries from the floor 3  8. Performing light activities around your home 3  9. Performing heavy activities around your home 3  10. Getting into/out of a car 4  11. Walking 2 blocks 1  12. Walking 1 mile 1  13. Going up/down 10 stairs (1 flight) 3  14. Standing for 1 hour 1  15.  sitting for 1 hour 4  16. Running on even ground 1  17. Running on uneven ground 1  18. Making sharp turns while running fast 1  19. Hopping  4  20. Rolling over in bed 4  Score total:  55/80     COGNITION: Overall cognitive status: Within functional limits for tasks assessed     SENSATION: WFL  EDEMA:  L: 56 cm, R: 52 cm L = 33.5 cm on 08/10/23  MUSCLE LENGTH: See ankle DF for soleus length May need to measure gastroc length  POSTURE: No Significant postural limitations  PALPATION: TTP L posterior heel, medial gastroc/soleus, medial posterior heel distal to medial malleolus  LOWER EXTREMITY ROM:  Active ROM Right eval Left eval  Hip flexion    Hip extension    Hip abduction    Hip adduction    Hip internal rotation    Hip external rotation    Knee flexion    Knee extension    Ankle dorsiflexion 8 8  Ankle plantarflexion 45 22  Ankle inversion 25 20 pain on lateral malleolus  Ankle eversion 30 10   (Blank rows = not tested)  LOWER EXTREMITY MMT:  MMT Right eval Left eval  Hip flexion 5 5  Hip extension    Hip abduction    Hip adduction    Hip internal rotation    Hip external rotation    Knee flexion 5 4  Knee extension 5 4  Ankle dorsiflexion 5 At least 3+ limited due to tenderness/pain  Ankle plantarflexion 5 At least 3+ limited due to tenderness/pain  Ankle inversion    Ankle eversion     (Blank rows = not tested)  LOWER EXTREMITY SPECIAL TESTS:  Did not assess  FUNCTIONAL TESTS:  5 times sit to stand: 17.99 with UE support SLS: 3 sec on L, 22.87 sec on R  GAIT: Distance  walked: Into clinic Assistive device utilized: Walker - 4 wheeled, CAM boot Level of assistance: Modified independence Comments: Mildly antalgic, L LE slightly externally rotated during stance  TREATMENT DATE:  08/18/23 THERAPEUTIC EXERCISE: To improve strength.  Demonstration, verbal and tactile cues throughout for technique. Nu-Step L4 x 6'  NEUROMUSCULAR RE-EDUCATION: To improve balance. Aerobic step ups on blue foam x 2/10 forward;  x 2/10 laterally RLE Standing aerobic step calf stretch knee straight x 1' x 2 RLE;   knee bent x 1' x 2 RLE Heel raises x 3/10 BLE Toe raises x 2/10 BLE  SLS on foam x 1' LLE --increased pain some Foam stand BLE EC x 1' Rocker stance S/S EC x 1' Rocker stance S/S rocking x 1' Rocker stance F/B EC X 1' Rocker stand F/B rocking x 1'  THERAPEUTIC ACTIVITIES: To improve functional performance.  Demonstration, verbal and tactile cues throughout for technique. Seated BAPS L4 CW x 20; CCW x 20 LLE Seated Calf stretch w/ inversion w/ strap x 1' x 2; with eversion w/ strap x 1' x 2  MANUAL THERAPY: To promote reduced pain utilizing therapeutic massage, myofascial release, and kinesiotaping. MFR and STM to achilles insertion scar and laterally toward lateral malleolus for pain. KT tape to Achilles 1 I strip heel to mid calf; 1 Y strip heel to midcalf; 1 I strip horizontal from medial to lateral malleolus  08/10/23 THERAPEUTIC EXERCISE: To improve ROM.  Demonstration, verbal and tactile cues throughout for technique. Bike L4 x 6'  THERAPEUTIC ACTIVITIES: To improve functional performance.  Demonstration, verbal and tactile cues throughout for technique. BAPS CW x 20;  CCW x 20 LLE  NEUROMUSCULAR RE-EDUCATION: To improve balance. Standing rocker board balance F/B x 1';  S/S x 1' Standing rocker board rocking F/B x 20;  S/S x  1' Balance disc step ups 2/10 LLE Balance disc SLS w/ UE support x 1'  08/04/23 THERAPEUTIC EXERCISE: To improve ROM.  Demonstration, verbal and tactile cues throughout for technique. Bike L4 x 6'  NEUROMUSCULAR RE-EDUCATION: To improve balance and proprioception. Step ups on aerobic and theraband blue foam F x 10; lateral x 10 LLE SLS on blue foam x 1' LLE Balance on rocker board x 1' F/B and 1' S/S Rocking on rocker board x 20 F/B and x 20 S/S  THERAPEUTIC ACTIVITIES: To improve functional performance.  Demonstration, verbal and tactile cues throughout for technique. Aerobic step toe prop calf stretch knee straight x 1' x 2 LLE; knee bent x 1' x 2Seated ankle inversion RTB x 20 LLE Seated ankle eversion RTB x 20 LLE Seated ankle PF w/ eccentric 5 sec lowering GTB x 20 LLE Seated ankle DF RTB x 20 LLE Standing heel/toe rocking x 20 BLE  MANUAL THERAPY: To promote reduced pain utilizing kinesiotaping. KT to L peroneal 2 piece, and ankle support figure 8 1 piece  07/27/23 Nustep L4 x 6'  Wall Gastroc and soleus stretches x 1' x 2 ea LLE Seated Toes apart eversion x 30 Seated Heels apart inversion x 30  Airex mat S/S and F/B weight shifts x 20 ea Airex mat SLS with UE support x 1' Airex mat step ups x 10 F; x 20 lateral LLE  Seated GTB ankle PF w/ eccentric return x 20 LLE Seated RTB ankle inversion x 20 LLE Seated YTB ankle eversion x 20 LLE  KT taping for peroneal relief 1st strap; second strap figure 8 around heel and ankle medial to lateral  Vasopneumatic ice to L foot/ankle at medium compression 34 degree temp x 15' supine   07/23/23 Nustep L4 x 6 min Standing gentle gastroc and solues stretch 2  x 30 STM to L gastro/soleus, achilles desensitization massage On airex w/o shoes:  Ant/post WS x 10  Lateral WS x 10  Step on and off airex laterally x 10 B Seated L ankle DF/EV GTB x 20 each Vaso R ankle low compression, x10 min, 34 deg temp, LE  elevated  07/20/23 Nustep L4 x 6 min Standing gentle gastroc stretch x 30 Seated soleus stretch with strap x 30 Seated toe yoga 2x10 IASTM with massage roller gastroc/soleus and peroneals Seated ankle ev/df green TB 2x10 Seated ankle inv green TB 2x10 Seated eccentric plantar flexion knee straight and then knee bent green TB 2x10 Vaso R ankle medium compression, x10 min, 34 deg temp, LE elevated  07/08/23 Nustep L5 x 6 min Seated heel slide x20 Seated eccentric heel raise 2x10 Ankle IV, EV RTB 2x10 Seated eccentric PF knee straight red TB 2x10 Seated marble pick up 2x10 Seated toe yoga 2x10 Seated arch lift x10 Standing arch lift x10 Standing hip abd 2x10  07/01/23 Nustep L4x75min Ankle IV,EV,DF RTB  Eccentric PF with RTB knee bent and straight Towel scrunch Gastroc stretch w/ towel longsitting; soleus stretch same position Gait training- cues for toe off, antalgic gait Seated ball squeeze 10x5; with LAQ x 10 B  06/29/23  See HEP below   PATIENT EDUCATION:  Education details: Exam findings, POC, initial HEP, discussed RICE for edema Person educated: Patient Education method: Explanation, Demonstration, and Handouts Education comprehension: verbalized understanding, returned demonstration, and needs further education  HOME EXERCISE PROGRAM: Access Code: [email or web address] URL: [email or web address] Date: 06/29/2023 Prepared by: Gellen April Earnie Starring  Exercises - CLX Ankle Dorsiflexion and Eversion  - 1 x daily - 7 x weekly - 2 sets - 10 reps - Seated Ankle Inversion with Resistance  - 1 x daily - 7 x weekly - 2 sets - 10 reps - Seated Eccentric Ankle Plantar Flexion with Resistance - Straight Leg  - 1 x daily - 7 x weekly - 3 sets - 10 reps - Seated Eccentric Ankle Plantar Flexion with Resistance  - 1 x daily - 7 x weekly - 3 sets - 10 reps - Towel Scrunches  - 1 x daily - 7 x weekly - 3 sets - 10 reps  ASSESSMENT:  CLINICAL IMPRESSION: 08/18/23:  Pain  along Achilles insertion and lateral malleolus limits treatment a little today; esp with SLS activities.  Balance is improving, but still has unsteadiness and pain in SLS.  She continues to progress to goals.    Eval: Patient is a 68 y.o. F who was seen today for physical therapy evaluation and treatment s/p L Achilles tendon reconstruction. Assessment is significant for decreased L foot/ankle ROM compared to R, pain, decreased gastroc/soleus strength, edema, and decreased balance affecting her ability to tolerate prolonged standing and walking for home and community tasks. Pt will benefit from PT to address these deficits to return to her normal function.   OBJECTIVE IMPAIRMENTS: Abnormal gait, decreased activity tolerance, decreased balance, decreased coordination, decreased endurance, decreased mobility, difficulty walking, decreased ROM, decreased strength, increased edema, increased fascial restrictions, increased muscle spasms, improper body mechanics, postural dysfunction, obesity, and pain.   ACTIVITY LIMITATIONS: standing, squatting, stairs, transfers, and locomotion level  PARTICIPATION LIMITATIONS: cleaning, laundry, shopping, community activity, and yard work  PERSONAL FACTORS: Age, Fitness, Past/current experiences, and Time since onset of injury/illness/exacerbation are also affecting patient's functional outcome.   REHAB POTENTIAL: Good  CLINICAL DECISION MAKING: Evolving/moderate complexity  EVALUATION COMPLEXITY: Moderate   GOALS: Goals  reviewed with patient? Yes  SHORT TERM GOALS: Target date: 07/27/2023  Pt will be ind with initial HEP Baseline: Goal status: MET- 07/23/23  2.  Pt will demo L = R ankle ROM Baseline:  Goal status: INITIAL  3.  Pt will report overall 50% improvement in her edema Baseline:  Goal status: INITIAL  LONG TERM GOALS: Target date: 08/24/2023   Pt will be ind with management and progression of HEP Baseline: 08/18/23  continuing to update  HEP Goal status: IN PROGRESS  2.  Pt will be able to tolerate L SLS for at least 10 sec to demo improving ankle strength/stability Baseline: unable to perform;  8/5 /25 Able to stand 10 sec SLS, but with pain Goal status: IN PROGRESS  3.  Pt will have improved 5x STS to </=13 sec to demo increased functional LE strength without UE use Baseline:  Goal status: INITIAL  4.  Pt will have improved LEFS to >/=64/80 to demo MCID Baseline:  Goal status: INITIAL  5.  Pt will be able to amb at least 1000' ind without any a/d for community amb Baseline:  Goal status: INITIAL  PLAN:  PT FREQUENCY: 2x/week  PT DURATION: 8 weeks  PLANNED INTERVENTIONS: [id number]- PT Re-evaluation, 97750- Physical Performance Testing, 97110-Therapeutic exercises, 97530- Therapeutic activity, 97112- Neuromuscular re-education, 97535- Self Care, [id number]- Manual therapy, [phone]- Gait training, [phone]- Aquatic Therapy, [phone]- Electrical stimulation (unattended), [id number]- Ultrasound, [id number]- Ionotophoresis 4mg /ml Dexamethasone, 79439 (1-2 muscles), [id number] (3+ muscles)- Dry Needling, Patient/Family education, Balance training, Stair training, Taping, Joint mobilization, Cryotherapy, and Moist heat  PLAN FOR NEXT SESSION: Continue with toe and heel raises; SLS balance activities; estim vs more KT tape PRN  Gussie Towson, PT 08/18/2023, 8:52 PM

## 2023-08-20 ENCOUNTER — Ambulatory Visit

## 2023-08-20 DIAGNOSIS — M25672 Stiffness of left ankle, not elsewhere classified: Secondary | ICD-10-CM

## 2023-08-20 DIAGNOSIS — M6281 Muscle weakness (generalized): Secondary | ICD-10-CM

## 2023-08-20 DIAGNOSIS — R262 Difficulty in walking, not elsewhere classified: Secondary | ICD-10-CM

## 2023-08-20 DIAGNOSIS — M7660 Achilles tendinitis, unspecified leg: Secondary | ICD-10-CM

## 2023-08-20 NOTE — Therapy (Signed)
 OUTPATIENT PHYSICAL THERAPY LOWER EXTREMITY TREATMENT   Patient Name: Terri Waters MRN: [id number] DOB:Dec 06, 1955, 68 y.o., female Today's Date: 08/20/2023  END OF SESSION:  PT End of Session - 08/20/23 1447     Visit Number 10    Number of Visits 16    Date for PT Re-Evaluation 08/24/23    Authorization Type [employer]    Progress Note Due on Visit 10    PT Start Time 1445    PT Stop Time 1535    PT Time Calculation (min) 50 min    Activity Tolerance Patient tolerated treatment well;No increased pain    Behavior During Therapy WFL for tasks assessed/performed                  Past Medical History:  Diagnosis Date   Cancer (HCC)    papillary carcinoma   Diabetes mellitus without complication (HCC)    High cholesterol    Hypertension    Hypothyroidism    Migraines    Stroke (HCC)    Thyroid  disease    Ventricular tachycardia (HCC)    Past Surgical History:  Procedure Laterality Date   ABDOMINAL HYSTERECTOMY     BREAST SURGERY     lumps removed - over 20 years ago   THYROIDECTOMY     TOTAL KNEE ARTHROPLASTY Right 12/18/2017   Procedure: RIGHT TOTAL KNEE ARTHROPLASTY;  Surgeon: Kay Kemps, MD;  Location: [facility] OR;  Service: Orthopedics;  Laterality: Right;   Patient Active Problem List   Diagnosis Date Noted   Dizziness 09/24/2020   AMS (altered mental status) 09/24/2020   Cerebellar infarct (HCC) 06/23/2019   Status post total knee replacement, 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   Migraine with aura and without status migrainosus, not intractable 04/09/2017    PCP: [name], MD  REFERRING PROVIDER: Aniceto Eva Grebe, PA-C  REFERRING DIAG: [phone] (ICD-10-CM) - Achilles tendinitis, left leg  THERAPY DIAG:  Insertional Achilles tendinopathy  Stiffness of left ankle, not elsewhere classified  Difficulty in walking, not elsewhere classified  Muscle weakness (generalized)  Rationale for Evaluation and Treatment: Rehabilitation  ONSET DATE: 04/21/23 s/p L achilles  tendon reconstruction  SUBJECTIVE:   SUBJECTIVE STATEMENT: 8/5:  States sore today from trying to stand on her tiptoes at home for housework.  Rates pain 4/10 today; denies any falls   PERTINENT HISTORY: R TKA, cervical radiculopathy  PAIN:  Are you having pain? Yes: NPRS scale: 2-3 /10 Pain location: medial and lateral ankle Pain description: can be dull or sharp, zings at night, throbs Aggravating factors: Standing and walking a lot Relieving factors: Ice, elevate  PRECAUTIONS: None Per [facility] referral order: Eccentric strengthening, gentle stretching, gait training From 06/02/23 note: We will have the patient transition into regular shoes. This should be a gradual progression. The patient should use pain as guide when ambulating in regular shoes and if pain is experienced the patient should spend more time in the Cam boot.SABRASABRAI provided her a light duty work note seated work primarily beginning on June 2...  RED FLAGS: None   WEIGHT BEARING RESTRICTIONS: No  FALLS:  Has patient fallen in last 6 months? 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  DIAGNOSTIC FINDINGS: Nothing new in chart since surgery  PATIENT SURVEYS:  LEFS  Extreme difficulty/unable (0), Quite a bit of difficulty (1), Moderate difficulty (2), Little difficulty (3), No difficulty (4) Survey date:  06/29/23  Any of your usual work, housework or school activities 2  2. Usual hobbies, recreational or sporting activities 4  3. Getting into/out of the bath 4  4. Walking between rooms 4  5. Putting on  socks/shoes 4  6. Squatting  3  7. Lifting an object, like a bag of groceries from the floor 3  8. Performing light activities around your home 3  9. Performing heavy activities around your home 3  10. Getting into/out of a car 4  11. Walking 2 blocks 1  12. Walking 1 mile 1  13. Going up/down 10 stairs (1 flight) 3  14. Standing for 1 hour 1  15.  sitting for 1 hour 4  16. Running on even ground 1  17. Running on uneven ground 1  18. Making sharp turns while running fast 1  19. Hopping  4  20. Rolling over in bed 4  Score total:  55/80     COGNITION: Overall cognitive status: Within functional limits for tasks assessed     SENSATION: WFL  EDEMA:  L: 56 cm, R: 52 cm L = 33.5 cm on 08/10/23  MUSCLE LENGTH: See ankle DF for soleus length May need to measure gastroc length  POSTURE: No Significant postural limitations  PALPATION: TTP L posterior heel, medial gastroc/soleus, medial posterior heel distal to medial malleolus  LOWER EXTREMITY ROM:  Active ROM Right eval Left eval R 08/20/23 L 08/20/23  Hip flexion      Hip extension      Hip abduction      Hip adduction      Hip internal rotation      Hip external rotation      Knee flexion      Knee extension      Ankle dorsiflexion 8 8 10 8   Ankle plantarflexion 45 22 45 50  Ankle inversion 25 20 pain on lateral malleolus 30 17  Ankle eversion 30 10 12 15    (Blank rows = not tested)  LOWER EXTREMITY MMT:  MMT Right eval Left eval  Hip flexion 5 5  Hip extension    Hip abduction    Hip adduction    Hip internal rotation    Hip external rotation    Knee flexion 5 4  Knee extension 5 4  Ankle dorsiflexion 5 At least 3+ limited due to tenderness/pain  Ankle plantarflexion 5 At least 3+ limited due to tenderness/pain  Ankle inversion    Ankle eversion     (Blank rows = not tested)  LOWER EXTREMITY SPECIAL TESTS:  Did not assess  FUNCTIONAL TESTS:  5 times sit to stand: 17.99 with UE support SLS: 3 sec  on L, 22.87 sec on R  GAIT: Distance walked: Into clinic Assistive device utilized: Walker - 4 wheeled, CAM boot Level of assistance: Modified independence Comments: Mildly antalgic, L LE slightly externally rotated during stance  TREATMENT DATE:  08/20/23 THERAPEUTIC EXERCISE: To improve strength.  Demonstration, verbal and tactile cues throughout for technique. Nu-Step L5 x 6' Gait 1000' in hallway- gait WFL, more of a limp toward the end Ankle ROM measured  NEUROMUSCULAR RE-EDUCATION: To improve balance. 5xSTS- 11.05 sec Heel raises 2 x 10 BLE Toe raises 2x10 BLE Clock balance RLE 5x 12 to 6 o'clock, progressed to calling out different dots for cognitive challenge LLE step up with single leg balance x 10 fwd and x 10 lateral  MANUAL THERAPY: To promote reduced pain utilizing therapeutic massage, myofascial release, and kinesiotaping. KT tape to Achilles 1 I strip heel to mid calf; 1 Y strip heel to midcalf; 1 I strip horizontal from medial to lateral malleolus; tape would not stay on so removed  08/18/23 THERAPEUTIC EXERCISE: To improve strength.  Demonstration, verbal and tactile cues throughout for technique. Nu-Step L4 x 6'  NEUROMUSCULAR RE-EDUCATION: To improve balance. Aerobic step ups on blue foam x 2/10 forward;  x 2/10 laterally RLE Standing aerobic step calf stretch knee straight x 1' x 2 RLE;   knee bent x 1' x 2 RLE Heel raises x 3/10 BLE Toe raises x 2/10 BLE  SLS on foam x 1' LLE --increased pain some Foam stand BLE EC x 1' Rocker stance S/S EC x 1' Rocker stance S/S rocking x 1' Rocker stance F/B EC X 1' Rocker stand F/B rocking x 1'  THERAPEUTIC ACTIVITIES: To improve functional performance.  Demonstration, verbal and tactile cues throughout for technique. Seated BAPS L4 CW x 20; CCW x 20 LLE Seated Calf stretch w/ inversion w/ strap  x 1' x 2; with eversion w/ strap x 1' x 2  MANUAL THERAPY: To promote reduced pain utilizing therapeutic massage, myofascial release, and kinesiotaping. MFR and STM to achilles insertion scar and laterally toward lateral malleolus for pain. KT tape to Achilles 1 I strip heel to mid calf; 1 Y strip heel to midcalf; 1 I strip horizontal from medial to lateral malleolus  08/10/23 THERAPEUTIC EXERCISE: To improve ROM.  Demonstration, verbal and tactile cues throughout for technique. Bike L4 x 6'  THERAPEUTIC ACTIVITIES: To improve functional performance.  Demonstration, verbal and tactile cues throughout for technique. BAPS CW x 20;  CCW x 20 LLE  NEUROMUSCULAR RE-EDUCATION: To improve balance. Standing rocker board balance F/B x 1';  S/S x 1' Standing rocker board rocking F/B x 20;  S/S x 1' Balance disc step ups 2/10 LLE Balance disc SLS w/ UE support x 1'  08/04/23 THERAPEUTIC EXERCISE: To improve ROM.  Demonstration, verbal and tactile cues throughout for technique. Bike L4 x 6'  NEUROMUSCULAR RE-EDUCATION: To improve balance and proprioception. Step ups on aerobic and theraband blue foam F x 10; lateral x 10 LLE SLS on blue foam x 1' LLE Balance on rocker board x 1' F/B and 1' S/S Rocking on rocker board x 20 F/B and x 20 S/S  THERAPEUTIC ACTIVITIES: To improve functional performance.  Demonstration, verbal and tactile cues throughout for technique. Aerobic step toe prop calf stretch knee straight x 1' x 2 LLE; knee bent x 1' x 2Seated ankle inversion RTB x 20 LLE Seated ankle eversion RTB x 20 LLE Seated ankle PF w/ eccentric 5 sec lowering GTB x 20 LLE Seated ankle DF RTB x 20 LLE Standing heel/toe rocking x 20 BLE  MANUAL THERAPY: To promote reduced pain utilizing kinesiotaping. KT to L peroneal 2 piece, and ankle support figure 8 1 piece  07/27/23 Nustep L4 x 6'  Wall Gastroc and soleus stretches x 1' x 2 ea LLE Seated Toes apart eversion x 30 Seated Heels apart  inversion x 30  Airex mat S/S and F/B weight shifts x 20 ea Airex mat SLS with UE support x 1' Airex mat step ups x 10 F; x 20 lateral LLE  Seated GTB ankle PF w/ eccentric return x 20 LLE Seated RTB ankle inversion x 20 LLE Seated YTB ankle eversion x 20 LLE  KT taping for peroneal relief 1st strap; second strap figure 8 around heel and ankle medial to lateral  Vasopneumatic ice to L foot/ankle at medium compression 34 degree temp x 15' supine   07/23/23 Nustep L4 x 6 min Standing gentle gastroc and solues stretch 2 x 30 STM to L gastro/soleus, achilles desensitization massage On airex w/o shoes:  Ant/post WS x 10  Lateral WS x 10  Step on and off airex laterally x 10 B Seated L ankle DF/EV GTB x 20 each Vaso R ankle low compression, x10 min, 34 deg temp, LE elevated  07/20/23 Nustep L4 x 6 min Standing gentle gastroc stretch x 30 Seated soleus stretch with strap x 30 Seated toe yoga 2x10 IASTM with massage roller gastroc/soleus and peroneals Seated ankle ev/df green TB 2x10 Seated ankle inv green TB 2x10 Seated eccentric plantar flexion knee straight and then knee bent green TB 2x10 Vaso R ankle medium compression, x10 min, 34 deg temp, LE elevated  07/08/23 Nustep L5 x 6 min Seated heel slide x20 Seated eccentric heel raise 2x10 Ankle IV, EV RTB 2x10 Seated eccentric PF knee straight red TB 2x10 Seated marble pick up 2x10 Seated toe yoga 2x10 Seated arch lift x10 Standing arch lift x10 Standing hip abd 2x10  07/01/23 Nustep L4x62min Ankle IV,EV,DF RTB  Eccentric PF with RTB knee bent and straight Towel scrunch Gastroc stretch w/ towel longsitting; soleus stretch same position Gait training- cues for toe off, antalgic gait Seated ball squeeze 10x5; with LAQ x 10 B  06/29/23  See HEP below   PATIENT EDUCATION:  Education details: Exam findings, POC, initial HEP, discussed RICE for edema Person educated: Patient Education method: Explanation,  Demonstration, and Handouts Education comprehension: verbalized understanding, returned demonstration, and needs further education  HOME EXERCISE PROGRAM: Access Code: [email or web address] URL: [email or web address] Date: 06/29/2023 Prepared by: Gellen April Earnie Starring  Exercises - CLX Ankle Dorsiflexion and Eversion  - 1 x daily - 7 x weekly - 2 sets - 10 reps - Seated Ankle Inversion with Resistance  - 1 x daily - 7 x weekly - 2 sets - 10 reps - Seated Eccentric Ankle Plantar Flexion with Resistance - Straight Leg  - 1 x daily - 7 x weekly - 3 sets - 10 reps - Seated Eccentric Ankle Plantar Flexion with Resistance  - 1 x daily - 7 x weekly - 3 sets - 10 reps - Towel Scrunches  - 1 x daily - 7 x weekly - 3 sets - 10 reps  ASSESSMENT:  CLINICAL IMPRESSION: Pt shows good progress with ankle ROM, 5xSTS score, and gait. Pt still challneged with L SLS activities. Some pain with step ups especially laterally. Attempted KT tape but tape would not stay on.   Eval: Patient is a 68 y.o. F who was seen today for physical therapy evaluation and treatment s/p L Achilles tendon reconstruction. Assessment is significant for decreased L foot/ankle ROM compared to R, pain, decreased gastroc/soleus strength, edema, and decreased  balance affecting her ability to tolerate prolonged standing and walking for home and community tasks. Pt will benefit from PT to address these deficits to return to her normal function.   OBJECTIVE IMPAIRMENTS: Abnormal gait, decreased activity tolerance, decreased balance, decreased coordination, decreased endurance, decreased mobility, difficulty walking, decreased ROM, decreased strength, increased edema, increased fascial restrictions, increased muscle spasms, improper body mechanics, postural dysfunction, obesity, and pain.   ACTIVITY LIMITATIONS: standing, squatting, stairs, transfers, and locomotion level  PARTICIPATION LIMITATIONS: cleaning, laundry, shopping,  community activity, and yard work  PERSONAL FACTORS: Age, Fitness, Past/current experiences, and Time since onset of injury/illness/exacerbation are also affecting patient's functional outcome.   REHAB POTENTIAL: Good  CLINICAL DECISION MAKING: Evolving/moderate complexity  EVALUATION COMPLEXITY: Moderate   GOALS: Goals reviewed with patient? Yes  SHORT TERM GOALS: Target date: 07/27/2023  Pt will be ind with initial HEP Baseline: Goal status: MET- 07/23/23  2.  Pt will demo L = R ankle ROM Baseline:  Goal status: IN PROGRESS- almost symmetrical 08/20/23 much more R ankle IV than L  3.  Pt will report overall 50% improvement in her edema Baseline:  Goal status: MET- 08/20/23  LONG TERM GOALS: Target date: 08/24/2023   Pt will be ind with management and progression of HEP Baseline: 08/18/23  continuing to update HEP Goal status: IN PROGRESS- met for current HEP 08/20/23  2.  Pt will be able to tolerate L SLS for at least 10 sec to demo improving ankle strength/stability Baseline: unable to perform;  8/5 /25 Able to stand 10 sec SLS, but with pain Goal status: IN PROGRESS  3.  Pt will have improved 5x STS to </=13 sec to demo increased functional LE strength without UE use Baseline:  Goal status: MET- 08/20/23  4.  Pt will have improved LEFS to >/=64/80 to demo MCID Baseline:  Goal status: INITIAL  5.  Pt will be able to amb at least 1000' ind without any a/d for community amb Baseline:  Goal status: MET- 08/20/23  PLAN:  PT FREQUENCY: 2x/week  PT DURATION: 8 weeks  PLANNED INTERVENTIONS: 02835- PT Re-evaluation, 97750- Physical Performance Testing, 97110-Therapeutic exercises, 97530- Therapeutic activity, 97112- Neuromuscular re-education, 97535- Self Care, 02859- Manual therapy, [phone]- Gait training, [phone]- Aquatic Therapy, [phone]- Electrical stimulation (unattended), [phone]- Ultrasound, 02966- Ionotophoresis 4mg /ml Dexamethasone, 79439 (1-2 muscles), 20561 (3+ muscles)- Dry  Needling, Patient/Family education, Balance training, Stair training, Taping, Joint mobilization, Cryotherapy, and Moist heat  PLAN FOR NEXT SESSION: do LEFS; Continue with toe and heel raises; SLS balance activities; estim vs more KT tape PRN  Sol LITTIE Gaskins, PTA 08/20/2023, 4:06 PM

## 2023-08-24 ENCOUNTER — Ambulatory Visit: Admitting: Rehabilitation

## 2023-09-02 ENCOUNTER — Ambulatory Visit: Admitting: Rehabilitation

## 2023-09-02 DIAGNOSIS — R29898 Other symptoms and signs involving the musculoskeletal system: Secondary | ICD-10-CM

## 2023-09-02 DIAGNOSIS — M6281 Muscle weakness (generalized): Secondary | ICD-10-CM

## 2023-09-02 DIAGNOSIS — R262 Difficulty in walking, not elsewhere classified: Secondary | ICD-10-CM

## 2023-09-02 DIAGNOSIS — M7660 Achilles tendinitis, unspecified leg: Secondary | ICD-10-CM | POA: Diagnosis not present

## 2023-09-02 DIAGNOSIS — M25672 Stiffness of left ankle, not elsewhere classified: Secondary | ICD-10-CM

## 2023-09-02 NOTE — Therapy (Addendum)
 OUTPATIENT PHYSICAL THERAPY LOWER EXTREMITY TREATMENT / DC SUMMARY   Patient Name: Terri Waters MRN: [id number] DOB:[birth year], 68 y.o., female Today's Date: 09/02/2023  END OF SESSION:  PT End of Session - 09/02/23 1430     Visit Number 11    Number of Visits 16    Date for PT Re-Evaluation 08/24/23    Authorization Type [employer]    Progress Note Due on Visit 10    PT Start Time 1415    PT Stop Time 1508    PT Time Calculation (min) 53 min    Activity Tolerance Patient tolerated treatment well;No increased pain    Behavior During Therapy WFL for tasks assessed/performed                  Past Medical History:  Diagnosis Date   Cancer (HCC)    papillary carcinoma   Diabetes mellitus without complication (HCC)    High cholesterol    Hypertension    Hypothyroidism    Migraines    Stroke (HCC)    Thyroid  disease    Ventricular tachycardia (HCC)    Past Surgical History:  Procedure Laterality Date   ABDOMINAL HYSTERECTOMY     BREAST SURGERY     lumps removed - over 20 years ago   THYROIDECTOMY     TOTAL KNEE ARTHROPLASTY Right 12/18/2017   Procedure: RIGHT TOTAL KNEE ARTHROPLASTY;  Surgeon: Kay Kemps, MD;  Location: [facility] OR;  Service: Orthopedics;  Laterality: Right;   Patient Active Problem List   Diagnosis Date Noted   Dizziness 09/24/2020   AMS (altered mental status) 09/24/2020   Cerebellar infarct (HCC) 06/23/2019   Status post total knee replacement, 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   Migraine with aura and without status migrainosus, not intractable 04/09/2017    PCP: [name], MD  REFERRING PROVIDER: Aniceto Eva Grebe, PA-C  REFERRING DIAG: [phone] (ICD-10-CM) - Achilles tendinitis, left leg  THERAPY DIAG:  Insertional Achilles tendinopathy  Stiffness of left ankle, not elsewhere classified  Difficulty in walking, not elsewhere classified  Muscle weakness (generalized)  Ankle weakness  Rationale for Evaluation and Treatment:  Rehabilitation  ONSET DATE: 04/21/23 s/p L achilles tendon reconstruction  SUBJECTIVE:   SUBJECTIVE STATEMENT: 8/5:  States sore today from trying to stand on her tiptoes at home for housework.  Rates pain 4/10 today; denies any falls   PERTINENT HISTORY: R TKA, cervical radiculopathy  PAIN:  Are you having pain? Yes: NPRS scale: 2-3 /10 Pain location: medial and lateral ankle Pain description: can be dull or sharp, zings at night, throbs Aggravating factors: Standing and walking a lot Relieving factors: Ice, elevate  PRECAUTIONS: None Per [facility] referral order: Eccentric strengthening, gentle stretching, gait training From 06/02/23 note: We will have the patient transition into regular shoes. This should be a gradual progression. The patient should use pain as guide when ambulating in regular shoes and if pain is experienced the patient should spend more time in the Cam boot.SABRASABRAI provided her a light duty work note seated work primarily beginning on June 2...  RED FLAGS: None   WEIGHT BEARING RESTRICTIONS: No  FALLS:  Has patient fallen in last 6 months? 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  DIAGNOSTIC FINDINGS: Nothing new in chart since surgery  PATIENT SURVEYS:  LEFS  Extreme difficulty/unable (0), Quite a bit of difficulty (1), Moderate difficulty (2), Little difficulty (3), No difficulty (4) Survey date:  06/29/23  Any of your usual work, housework or school activities 2  2. Usual hobbies, recreational or sporting activities 4  3. Getting into/out of the bath 4   4. Walking between rooms 4  5. Putting on socks/shoes 4  6. Squatting  3  7. Lifting an object, like a bag of groceries from the floor 3  8. Performing light activities around your home 3  9. Performing heavy activities around your home 3  10. Getting into/out of a car 4  11. Walking 2 blocks 1  12. Walking 1 mile 1  13. Going up/down 10 stairs (1 flight) 3  14. Standing for 1 hour 1  15.  sitting for 1 hour 4  16. Running on even ground 1  17. Running on uneven ground 1  18. Making sharp turns while running fast 1  19. Hopping  4  20. Rolling over in bed 4  Score total:  55/80     COGNITION: Overall cognitive status: Within functional limits for tasks assessed     SENSATION: WFL  EDEMA:  L: 56 cm, R: 52 cm L = 33.5 cm on 08/10/23  MUSCLE LENGTH: See ankle DF for soleus length May need to measure gastroc length  POSTURE: No Significant postural limitations  PALPATION: TTP L posterior heel, medial gastroc/soleus, medial posterior heel distal to medial malleolus  LOWER EXTREMITY ROM:  Active ROM Right eval Left eval R 08/20/23 L 08/20/23 LLE 09/02/23  Hip flexion       Hip extension       Hip abduction       Hip adduction       Hip internal rotation       Hip external rotation       Knee flexion       Knee extension       Ankle dorsiflexion 8 8 10 8 6   Ankle plantarflexion 45 22 45 50 50  Ankle inversion 25 20 pain on lateral malleolus 30 17 35  Ankle eversion 30 10 12 15  35   (Blank rows = not tested)  LOWER EXTREMITY MMT:  MMT Right eval Left eval LLE 09/02/23  Hip flexion 5 5   Hip extension     Hip abduction     Hip adduction     Hip internal rotation     Hip external rotation     Knee flexion 5 4 5   Knee extension 5 4 5   Ankle dorsiflexion 5 At least 3+ limited due to tenderness/pain 4  Ankle plantarflexion 5 At least 3+ limited due to tenderness/pain 2+  Ankle inversion   4-  Ankle eversion   4   (Blank rows = not tested)  LOWER EXTREMITY  SPECIAL TESTS:  Did not assess  FUNCTIONAL TESTS:  5 times sit to stand: 17.99 with UE support SLS: 3 sec on L, 22.87 sec on R  GAIT: Distance walked: Into clinic Assistive device utilized: Walker - 4 wheeled, CAM boot Level of assistance: Modified independence Comments: Mildly antalgic, L LE slightly externally rotated during stance  TREATMENT DATE:  09/02/23 THERAPEUTIC ACTIVITIES: To improve functional performance.  Demonstration, verbal and tactile cues throughout for technique. Ankle GTB inversion, eversion, dorsiflexion x 3/10 LLE Standing toe raises to tip toe w/ 30% WB LLE x 3/10 BLE  NEUROMUSCULAR RE-EDUCATION: To improve balance. Long foam stand EO x 1'; EC with head turns x 1' Long foam foot prop with toe raise to neutral 30% WB LLE x 3/10 BLE Long foam tandem gait forward, unable backward Long foam SLS attempted but painful SLS on floor w/ contralateral toe touch x 1' LLE Incline ramp standing w/ wt shifting S/S X 1' Decline ramp standing w/ wt shifts S/S X 1' Decline/Incline ramp walking across with step down/stepup x 10  MANUAL THERAPY: To promote reduced pain utilizing connective tissue massage and myofascial release. STM and MFR to L ankle anterior/posterior to lateral malleolus and peroneal tendon   08/20/23 THERAPEUTIC EXERCISE: To improve strength.  Demonstration, verbal and tactile cues throughout for technique. Nu-Step L5 x 6' Gait 1000' in hallway- gait WFL, more of a limp toward the end Ankle ROM measured  NEUROMUSCULAR RE-EDUCATION: To improve balance. 5xSTS- 11.05 sec Heel raises 2 x 10 BLE Toe raises 2x10 BLE Clock balance RLE 5x 12 to 6 o'clock, progressed to calling out different dots for cognitive challenge LLE step up with single leg balance x 10 fwd and x 10 lateral  MANUAL THERAPY: To promote reduced pain utilizing  therapeutic massage, myofascial release, and kinesiotaping. KT tape to Achilles 1 I strip heel to mid calf; 1 Y strip heel to midcalf; 1 I strip horizontal from medial to lateral malleolus; tape would not stay on so removed PATIENT EDUCATION:  Education details: reviewed TB HEP for continued strengthening Person educated: Patient Education method: Explanation, Demonstration, and Handouts Education comprehension: verbalized understanding, returned demonstration, and needs further education  HOME EXERCISE PROGRAM: Access Code: [email or web address] URL: [email or web address] Date: 06/29/2023 Prepared by: Gellen April Earnie Starring  Exercises - CLX Ankle Dorsiflexion and Eversion  - 1 x daily - 7 x weekly - 2 sets - 10 reps - Seated Ankle Inversion with Resistance  - 1 x daily - 7 x weekly - 2 sets - 10 reps - Seated Eccentric Ankle Plantar Flexion with Resistance - Straight Leg  - 1 x daily - 7 x weekly - 3 sets - 10 reps - Seated Eccentric Ankle Plantar Flexion with Resistance  - 1 x daily - 7 x weekly - 3 sets - 10 reps - Towel Scrunches  - 1 x daily - 7 x weekly - 3 sets - 10 reps  ASSESSMENT:  CLINICAL IMPRESSION: Patient has been seen x 2 months PT following a L Achilles reconstruction.  She has made good progress, but has not yet attained the goals that we have set for her.  Her L ankle ROM has improved to  WNL.  She is transferring and ambulating independently with no device, but still has variable limp on the LLE.   Her main deficit at this time is L ankle strength.  It has improved considerably and she is able to do calf raises using BLE.  However, she cannot yet do a single leg calf raise on the LLE.   She can do seated resisted plantarflexion making her strength 2+ for plantarflexion.   She also still has some pain along the peroneal tendons distally with fairly exquisite point tenderness behind the L lateral mallelous.   Pain is worse with weight bearing plantarflexion but otherwise  she has  no pain.   PT remains necessary for residual deficits of decreased strength and increased pain.   She is agreeable to continue PT.  We will send this note to her MD for signature for recertification of PT service for another 1-2 months PRN  Eval: Patient is a 68 y.o. F who was seen today for physical therapy evaluation and treatment s/p L Achilles tendon reconstruction. Assessment is significant for decreased L foot/ankle ROM compared to R, pain, decreased gastroc/soleus strength, edema, and decreased balance affecting her ability to tolerate prolonged standing and walking for home and community tasks. Pt will benefit from PT to address these deficits to return to her normal function.   OBJECTIVE IMPAIRMENTS: Abnormal gait, decreased activity tolerance, decreased balance, decreased coordination, decreased endurance, decreased mobility, difficulty walking, decreased ROM, decreased strength, increased edema, increased fascial restrictions, increased muscle spasms, improper body mechanics, postural dysfunction, obesity, and pain.   ACTIVITY LIMITATIONS: standing, squatting, stairs, transfers, and locomotion level  PARTICIPATION LIMITATIONS: cleaning, laundry, shopping, community activity, and yard work  PERSONAL FACTORS: Age, Fitness, Past/current experiences, and Time since onset of injury/illness/exacerbation are also affecting patient's functional outcome.   REHAB POTENTIAL: Good  CLINICAL DECISION MAKING: Evolving/moderate complexity  EVALUATION COMPLEXITY: Moderate   GOALS: Goals reviewed with patient? Yes  SHORT TERM GOALS: Target date: 07/27/2023  Pt will be ind with initial HEP Baseline: Goal status: MET- 07/23/23  2.  Pt will demo L = R ankle ROM Baseline:  Goal status: IN PROGRESS- almost symmetrical 08/20/23 much more R ankle IV than L  3.  Pt will report overall 50% improvement in her edema Baseline:  Goal status: MET- 08/20/23  LONG TERM GOALS: Target date:  09/30/2023  Pt will be ind with management and progression of HEP Baseline: 08/18/23  continuing to update HEP Goal status: IN PROGRESS- met for current HEP 08/20/23  2.  Pt will be able to tolerate L SLS for at least 10 sec to demo improving ankle strength/stability Baseline: unable to perform;  8/5 /25 Able to stand 10 sec SLS, but with pain 09/02/23  Goal status: IN PROGRESS  3.  Pt will have improved 5x STS to </=13 sec to demo increased functional LE strength without UE use Baseline:  Goal status: MET- 08/20/23  4.  Pt will have improved LEFS to >/=64/80 to demo MCID Baseline:  Goal status: INITIAL  5.  Pt will be able to amb at least 1000' ind without any a/d for community amb Baseline:  Goal status: MET- 08/20/23  6.  Patient will be able to do 5-10 calf raises on the L foot only with min to no pain for sufficient Achilles strength to walk up/down hills and incline surfaces without pain or limp  Baseline:  09/02/23 unable SLS calf raises on LLE   Goal status:  INITIAL    7.  Patient will demonstrate 5/5 L foot/ankle strength in all planes to be able to prevent ankle sprains and ambulate safely on unlevel ground with no limp and good balance   Baseline:  09/02/23 see MMT table above   Goal status:  INITIAL PLAN:  PT FREQUENCY: 2x/week  PT DURATION: 8 weeks  PLANNED INTERVENTIONS: [id number]- PT Re-evaluation, 97750- Physical Performance Testing, 97110-Therapeutic exercises, 97530- Therapeutic activity, 97112- Neuromuscular re-education, 97535- Self Care, [id number]- Manual therapy, [id number]- Gait training, [phone]- Aquatic Therapy, [phone]- Electrical stimulation (unattended), [id number]- Ultrasound, [id number]- Ionotophoresis 4mg /ml Dexamethasone, 79439 (1-2 muscles), [id number] (3+ muscles)- Dry Needling, Patient/Family education, Balance training, Stair training, Taping, Joint  mobilization, Cryotherapy, and Moist heat  PLAN FOR NEXT SESSION: do LEFS; progress toe and heel raises to unilateral LLE as able (may  take a few more weeks); SLS balance activities;  Maxine Fredman, PT 09/02/2023, 3:21 PM   PHYSICAL THERAPY DISCHARGE SUMMARY  Visits from Start of Care: 11  Current functional level related to goals / functional outcomes: Patient contacted the office and states her provider advised her she could finish PT and to cancel future appointment   Remaining deficits: AS OUTLINED IN PRIOR AND ABOVE NOTES   Education / Equipment: Patient is independent with all home exercises and advised to continue daily as tolerated and call us  with any questions   Patient agrees to discharge. Patient goals were partially met. Patient is being discharged due to the physician's request.

## 2023-09-09 ENCOUNTER — Ambulatory Visit: Admitting: Rehabilitation

## 2023-09-16 ENCOUNTER — Ambulatory Visit

## 2023-09-21 ENCOUNTER — Encounter

## 2023-09-23 ENCOUNTER — Encounter

## 2023-09-28 ENCOUNTER — Encounter

## 2023-09-30 ENCOUNTER — Encounter: Admitting: Rehabilitation
